# Patient Record
Sex: Male | Born: 1950 | Race: White | Hispanic: No | Marital: Married | Smoking: Former smoker
Health system: Southern US, Community
[De-identification: ages and names within clinical notes are randomized; demographics above are authoritative.]

## PROBLEM LIST (undated history)

## (undated) DIAGNOSIS — I10 Essential (primary) hypertension: Secondary | ICD-10-CM

## (undated) DIAGNOSIS — I69322 Dysarthria following cerebral infarction: Secondary | ICD-10-CM

## (undated) DIAGNOSIS — C4921 Malignant neoplasm of connective and soft tissue of right lower limb, including hip: Secondary | ICD-10-CM

## (undated) HISTORY — PX: KNEE SURGERY: SHX244

---

## 2002-06-03 ENCOUNTER — Observation Stay (HOSPITAL_COMMUNITY): Admission: EM | Admit: 2002-06-03 | Discharge: 2002-06-04 | Payer: Self-pay | Admitting: Neurology

## 2002-06-03 ENCOUNTER — Encounter: Payer: Self-pay | Admitting: Neurology

## 2002-06-23 ENCOUNTER — Encounter: Admission: RE | Admit: 2002-06-23 | Discharge: 2002-08-19 | Payer: Self-pay | Admitting: Family Medicine

## 2002-08-27 ENCOUNTER — Encounter: Admission: RE | Admit: 2002-08-27 | Discharge: 2002-11-25 | Payer: Self-pay | Admitting: Family Medicine

## 2003-12-18 ENCOUNTER — Ambulatory Visit (HOSPITAL_COMMUNITY): Admission: RE | Admit: 2003-12-18 | Discharge: 2003-12-18 | Payer: Self-pay | Admitting: Family Medicine

## 2005-01-19 ENCOUNTER — Emergency Department: Payer: Self-pay | Admitting: General Practice

## 2007-03-03 ENCOUNTER — Ambulatory Visit (HOSPITAL_COMMUNITY): Admission: RE | Admit: 2007-03-03 | Discharge: 2007-03-03 | Payer: Self-pay | Admitting: Gastroenterology

## 2007-03-03 ENCOUNTER — Encounter (INDEPENDENT_AMBULATORY_CARE_PROVIDER_SITE_OTHER): Payer: Self-pay | Admitting: Gastroenterology

## 2011-01-30 NOTE — Op Note (Signed)
NAME:  Eric Barron, Eric Barron NO.:  [id number]   MEDICAL RECORD NO.:  [id number]          PATIENT TYPE:  AMB   LOCATION:  ENDO                         FACILITY:  [facility]   PHYSICIAN:  Anselmo Rod, M.D.  DATE OF BIRTH:  [birth year]   DATE OF PROCEDURE:  03/03/2007  DATE OF DISCHARGE:                               OPERATIVE REPORT   PROCEDURE PERFORMED:  Colonoscopy with snare polypectomy x 2 and cold  biopsies x 1.   ENDOSCOPIST:  Anselmo Rod, M.D.   INSTRUMENT USED:  Pentax video colonoscope.   INDICATIONS FOR PROCEDURE:  60 year old white male with a history of  stroke in 2003 on Plavix undergoing screening colonoscopy. The patient's  Plavix had been held for a week prior to the procedure.   PREPROCEDURE PREPARATION:  Informed consent was procured from the  patient. The patient fasted for hours prior to the procedure and prepped  with Moviprep the night of and the morning of the procedure. Risks and  benefits of the procedure including a 10% miss rate of cancer and polyp  were discussed with the patient as well. The patient's Plavix was held  for 7 days prior to the procedure as mentioned above.   PREPROCEDURE PHYSICAL:  The patient had stable vital signs.  Neck  supple. Chest clear to auscultation. S1, S2 regular. Abdomen soft with  normal bowel sounds.   DESCRIPTION OF PROCEDURE:  The patient was placed in the left lateral  decubitus position and sedated with 100 mcg of fentanyl and 10 mg of  Versed given intravenously in slow incremental doses. Once the patient  was adequately sedated and maintained on low-flow oxygen and continuous  cardiac monitoring, the Pentax video colonoscope was advanced from the  rectum to the cecum.  Small internal hemorrhoids were seen on  retroflexion. A few early sigmoid diverticula were noted.  The scope was  then advanced to the transverse colon, right colon and cecum.  A small  sessile polyp was removed by hot snare from  the cecal base at lower  settings in (150/15). Another small sessile polyp was biopsied from the  cecum was well. The terminal ileum appeared healthy without lesions.  The appendiceal orifice and ileocecal valve were clearly visualized and  photographed.  A small sessile polyp was removed by hot snare from the  rectosigmoid colon as well.  The patient tolerated the procedure well  without complications.   IMPRESSION:  1. Small nonbleeding internal hemorrhoids.  2. Sigmoid diverticulosis (small diverticula present).  3. Small sessile polyp removed by hot snare from the rectosigmoid      colon in (200/20 setting).  4. Small sessile polyp removed by cold biopsy and a second larger      sessile polyp removed by hot snare from the cecum (150/50 in).  5. Normal terminal ileum.   RECOMMENDATIONS:  1. Await pathology results.  2. Avoid all nonsteroidals including aspirin for next 2 weeks.  3. Hold off on the Plavix for the next week.  4. Resume Plavix thereafter.  5. Outpatient follow-up as need arises in the future.  6.  Repeat colonoscopy will be done depending on the pathology results.      Anselmo Rod, M.D.  Electronically Signed     JNM/MEDQ  D:  03/03/2007  T:  03/04/2007  Job:  [id number]   cc:   Renaye Rakers, M.D.  Fax: [phone]

## 2011-02-02 NOTE — H&P (Signed)
NAME:  Eric Barron, Eric Barron                        ACCOUNT NO.:  [id number]   MEDICAL RECORD NO.:  [id number]                   PATIENT TYPE:  INP   LOCATION:  3020                                 FACILITY:  [facility]   PHYSICIAN:  Marlan Palau, M.D.               DATE OF BIRTH:  [birth year]   DATE OF ADMISSION:  06/03/2002  DATE OF DISCHARGE:                                HISTORY & PHYSICAL   HISTORY OF PRESENT ILLNESS:  The patient is a 60 year old left-handed white  male born 08-12-1951, with a history of hypertension that was  relatively  untreated  until an admission to [facility] in May of  2003. The patient sustained a left thalamic stroke at that time and has had  some memory deficits as a major problem since that time. This patient has  been treated with lipid lowering agents and blood pressure medications since  that time, and has returned to work  at the end of July 2003.  The patient,  however, reports problems with night sweats that have occurred off and on  over the last month, and the patient over the last  two days has felt  weird with some question of increased dragging of the  right leg and  slurred speech. For this reason,  the patient went to [facility]  yesterday. An  MRI scan of the  brain was done showing what was felt to be  an acute left pontine stroke event.   Upon  my review, this appears not to be an acute stroke. This reading is  confirmed by Dr. Paulina Fusi through radiology here at this hospital. The  old left thalamic stroke has been also seen. No new ischemic changes are  noted.  An MI angiography is not available. The patient was  sent over to  this hospital as per the family's request on heparin. The patient had been  on aspirin and Plavix prior to this admission.   PAST MEDICAL HISTORY:  Significant for:  1. 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 Evidence of pontine     ischemia that is old.  2. Hypertension.  3.  Hyperlipidemia.  4. History of night sweats x 4 weeks.  5. History of  skin cancer of some sort, status post wide resection of the     right knee.  6. 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   MEDICATIONS:  1. Plavix 25 mg q.d.  2. Lipitor 10 mg q.d.  3. Toprol XL 100 mg q.d.  4. Atacand 32/12.5 mg q.d.  5. Aspirin 81 mg q.d.   ALLERGIES:  No known drug allergies.   SOCIAL HISTORY:  He quit  smoking  two packs of cigarettes a day in May  2003. The patient does not drink alcohol.  The patient is married lives in  the  [place] area. He has one son who is alive and well, age  14.   FAMILY HISTORY:  Mother is alive and well. History of strokes are in the  mother's family. Father died at age 69 with congestive heart failure. The  patient has one  brother, one sister, both alive and well.   REVIEW OF SYSTEMS:  Notable  for chills and  sweats over the last four  weeks. They occur  at night. The patient denies headaches. Denies weight  loss. Denies shortness of breath, cough, chest pains. Denies visual field  changes,swallowing problems, dizziness, blackout episodes. He has had some  questionable increased fatigue and weakness on the right side in the last  several days. Denies  any problems with control of the  bowels or bladder.  He has had some constipation problems.   PHYSICAL EXAMINATION:  VITAL SIGNS:  Blood pressure 150/82, heart rate 64,  temperature 99.4.  GENERAL:  This patient is a fairly developed white male who is alert and  cooperative at the time of examination.  HEENT: Head is atraumatic. Eyes, pupils equally round and reactive to light.  Disks are flat bilaterally.  NECK:  Supple, no carotid bruits are noted.  RESPIRATORY:  Exam is clear.  CARDIOVASCULAR:  Examination reveals a regular rate and rhythm, no obvious  murmurs or rubs noted.  ABDOMEN:  Revealed positive bowel sounds, no organomegaly or tenderness  noted.   EXTREMITIES:  Without significant edema.  NEUROLOGIC:  Cranial nerves as above.  Facial symmetry is present. The  patient has good sensation in the face to pinprick and soft touch  bilaterally. He has good strength. The facial muscles are intact as well as  shoulder shrug bilaterally.  The visual fields are full. Extraocular  movements are full. Speech is well enunciated, not aphasic. Motor testing  shows 5/5 strength on all 4s. Good symmetric motor tone is noted throughout.  Sensory testing is intact to pinprick, soft touch and vibration sensation  throughout. The deep tendon reflexes are somewhat brisk but symmetric  throughout. Toes are downgoing bilaterally. The patient is able to perform  finger-nose-finger,  toe-to-finger bilaterally. Questionable slight ataxia  with toe-to-finger on the right as compared to the left. The patient is able  to ambulate well without assistance.  Tandem gait is unremarkable. Romberg  negative. No evidence of pronator drift is seen.   LABORATORY DATA:  Admission laboratory values are pending at this time.  Chest x-ray and EKG are pending.   IMPRESSION:  1. History of cerebrovascular disease. No evidence of an acute stroke by MRI     scan.  2. History of night sweats over the last four weeks, slight decrease in     functional level  noted with these.   This patient has an unremarkable examination at this point. Major clinical  deficit following stroke in May 2003 was that of memory problem, which  correlates well with the area of infarction involving the left thalamus of  the brain. At this point, the patient has been having  night sweats over the  last month or so. The cause of this is not  clear. Will proceed a bit  further with cerebrovascular workup and a workup for connective tissue  disease process, but it appears that the MRI scan  read at [facility]  may not be accurate. No evidence of acute pontine infarct is seen. Again this is  confirmed by review through radiology at this hospital.   PLAN:  1. An MR angiogram, intracranial and extracranial vessels.  2. Continue aspirin and Plavix.  3.     Discontinue heparin, IV fluids.  4. Admission blood work, do a connective tissue workup as well. If blood     work seems to be fairly unremarkable, consider discharge in the morning.     Will follow the patient's clinical course while in house.                                                 Marlan Palau, M.D.    CKW/MEDQ  D:  06/03/2002  T:  06/03/2002  Job:  [id number]   cc:   Myles Rosenthal, M.D.   [facility]  [address]

## 2011-02-02 NOTE — Discharge Summary (Signed)
NAME:  Eric Barron, Eric Barron                        ACCOUNT NO.:  [id number]   MEDICAL RECORD NO.:  [id number]                   PATIENT TYPE:  INP   LOCATION:  3020                                 FACILITY:  [facility]   PHYSICIAN:  Marlan Palau, M.D.               DATE OF BIRTH:  [birth year]   DATE OF ADMISSION:  06/03/2002  DATE OF DISCHARGE:  06/04/2002                                 DISCHARGE SUMMARY   ADMISSION DIAGNOSIS:  New pontine stroke event.   DISCHARGE DIAGNOSES:  1. History of cerebrovascular disease, no acute cerebrovascular infarction     identified.  2. Hypertension.  3. Hyperlipidemia.  4. Episodic night sweats, unclear etiology.   PROCEDURES:  1. MRI of the brain.  2. MR angiogram.   COMPLICATIONS:  None.   HISTORY OF PRESENT ILLNESS:  The patient is a 60 year old left-handed white  male born [birth year] with a history of hypertension, hyperlipidemia.  The  patient sustained a left thalamic stroke in May 2003, had evidence of  pontine ischemia at that time by MRI scan.  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  The patient has been feeling poorly over  the last several weeks.  He has had intermittent night sweats over the last  month.  There was some question of if he had new problems with the right  body, dragging his right leg, etc., prior to admission at the [facility] on 06/02/2002.  MRI scan at [place] was done and was apparently  read out as being an acute pontine stroke event.  The patient's family had  requested that he be transferred to [facility].  Upon review of the  MRI scan, no evidence of an acute stroke is seen.   PAST MEDICAL HISTORY:  1. 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  2. Hypertension.  3. Hyperlipidemia.  4. Recent night sweats of unclear etiology.  5. History of skin cancer of unknown  type status post resection of the right     knee.  6. Partial amputation of left toe.   ALLERGIES:  No known allergies.   HABITS:  The patient does not smoke, quit in May 2003.  Does not drink  alcohol.   MEDICATIONS ON ADMISSION:  1. Plavix 75 mg a day.  2. Lipitor 10 mg a day.  3. Toprol XL 100 mg daily.  4. Atacand 2/12.5 mg daily.  5. Aspirin 81 mg a day.   Please refer to History and Physical dictation for Social History, Family  History, Review of Systems, Physical Examination.   LABORATORY DATA:  Notable for sodium 143, potassium 3.3, chloride 110, CO2  25, glucose 118, BUN 11, creatinine 0.9, calcium 9.0, total protein 6.6,  albumin 3.4, AST 17, ALT 22, alkaline phosphatase  107, total bilirubin 0.4.  White count 9.8, hemoglobin 12.9,  hematocrit 37.7, MCV 87.1, platelets 185.   A PPD has been placed and is pending.  Sed rate 28.  ANA, rheumatoid factor,  angiotensin-converting enzyme, CEA level pending.  RPR was nonreactive.  TSH  is pending.   EKG reveals normal sinus rhythm, normal EKG.  Heart rate is 60.   HOSPITAL COURSE:  This patient was admitted to [facility].  Upon  review of the MRI scan, heparin was discontinued, and aspirin and Plavix  were reinstituted.  Potassium level was 3.3, and the patient was given some  potassium supplementation.  The patient did undergo an MRI of the brain  which again reaffirmed that no acute stroke was present in the brain.  MR  angiogram by my review appears to be relatively unremarkable.  Formal  written report is not yet available. The patient appears to have evidence of  small vessel disease by MRI scan, and treatment of this entails antiplatelet  agents.  The patient will remain on aspirin and Plavix for now.  The patient  will require further workup to evaluate the night sweats.   At this time, the patient will be discharged to home.   DISCHARGE MEDICATIONS:  1. Plavix 75 mg a day.  2. Lipitor 10 mg a day.  3.  Toprol XL 100 mg 1 a day.  4. Atacand 32/12.5 mg 1 a day.  5. Aspirin 81 mg a day.   ACTIVITY:  The patient is to have no strenuous activity.   DIET:  No added salt.   FOLLOW UP:  He will need PPD read in the next 24 hours.  Follow up with  [facility] in three weeks following discharge.                                               Marlan Palau, M.D.    CKW/MEDQ  D:  06/04/2002  T:  06/06/2002  Job:  [id number]   cc:   Alberteen Sam, M.D.  [facility]

## 2012-12-08 ENCOUNTER — Ambulatory Visit: Payer: Self-pay | Admitting: Family Medicine

## 2016-11-30 ENCOUNTER — Other Ambulatory Visit: Payer: Self-pay | Admitting: Nurse Practitioner

## 2016-11-30 DIAGNOSIS — Z8673 Personal history of transient ischemic attack (TIA), and cerebral infarction without residual deficits: Secondary | ICD-10-CM

## 2016-12-04 ENCOUNTER — Ambulatory Visit
Admission: RE | Admit: 2016-12-04 | Discharge: 2016-12-04 | Disposition: A | Discharge status: Home / Self Care | Payer: Medicare Other | Source: Ambulatory Visit | Attending: Nurse Practitioner | Admitting: Nurse Practitioner

## 2016-12-04 DIAGNOSIS — G319 Degenerative disease of nervous system, unspecified: Secondary | ICD-10-CM | POA: Insufficient documentation

## 2016-12-04 DIAGNOSIS — Z8673 Personal history of transient ischemic attack (TIA), and cerebral infarction without residual deficits: Secondary | ICD-10-CM | POA: Diagnosis present

## 2016-12-04 DIAGNOSIS — I739 Peripheral vascular disease, unspecified: Secondary | ICD-10-CM | POA: Insufficient documentation

## 2016-12-04 DIAGNOSIS — Z09 Encounter for follow-up examination after completed treatment for conditions other than malignant neoplasm: Secondary | ICD-10-CM | POA: Insufficient documentation

## 2016-12-04 LAB — POCT I-STAT CREATININE: Creatinine, Ser: 0.9 mg/dL (ref 0.61–1.24)

## 2016-12-04 MED ORDER — GADOBENATE DIMEGLUMINE 529 MG/ML IV SOLN
15.0000 mL | Freq: Once | INTRAVENOUS | Status: AC | PRN
  Administered 2016-12-04: 15 mL via INTRAVENOUS

## 2019-08-24 ENCOUNTER — Other Ambulatory Visit: Payer: Self-pay

## 2019-08-24 DIAGNOSIS — Z20822 Contact with and (suspected) exposure to covid-19: Secondary | ICD-10-CM

## 2019-08-25 LAB — NOVEL CORONAVIRUS, NAA: SARS-CoV-2, NAA: NOT DETECTED

## 2019-08-26 ENCOUNTER — Ambulatory Visit: Payer: Self-pay | Admitting: *Deleted

## 2019-08-26 NOTE — Telephone Encounter (Signed)
I had pt's family member on phone giving result and they asked to look up his result. His is negative but I advised him to quarantine as he has been exposed and just took the same phone from a positive person to speak with me while they are standing side by side without precautions.

## 2019-12-15 ENCOUNTER — Inpatient Hospital Stay (HOSPITAL_COMMUNITY)
Admission: EM | Admit: 2019-12-15 | Discharge: 2019-12-19 | DRG: 065 | Disposition: A | Discharge status: Skilled Nursing Facility | Payer: Medicare Other | Attending: Internal Medicine | Admitting: Internal Medicine

## 2019-12-15 ENCOUNTER — Emergency Department (HOSPITAL_COMMUNITY): Payer: Medicare Other

## 2019-12-15 ENCOUNTER — Other Ambulatory Visit: Payer: Self-pay

## 2019-12-15 DIAGNOSIS — Z7982 Long term (current) use of aspirin: Secondary | ICD-10-CM | POA: Diagnosis not present

## 2019-12-15 DIAGNOSIS — I6359 Cerebral infarction due to unspecified occlusion or stenosis of other cerebral artery: Secondary | ICD-10-CM | POA: Diagnosis not present

## 2019-12-15 DIAGNOSIS — Z8673 Personal history of transient ischemic attack (TIA), and cerebral infarction without residual deficits: Secondary | ICD-10-CM | POA: Diagnosis not present

## 2019-12-15 DIAGNOSIS — Z23 Encounter for immunization: Secondary | ICD-10-CM | POA: Diagnosis present

## 2019-12-15 DIAGNOSIS — Z87891 Personal history of nicotine dependence: Secondary | ICD-10-CM | POA: Diagnosis not present

## 2019-12-15 DIAGNOSIS — E785 Hyperlipidemia, unspecified: Secondary | ICD-10-CM | POA: Diagnosis present

## 2019-12-15 DIAGNOSIS — G3184 Mild cognitive impairment, so stated: Secondary | ICD-10-CM | POA: Diagnosis present

## 2019-12-15 DIAGNOSIS — R1310 Dysphagia, unspecified: Secondary | ICD-10-CM | POA: Diagnosis present

## 2019-12-15 DIAGNOSIS — R29810 Facial weakness: Secondary | ICD-10-CM | POA: Diagnosis present

## 2019-12-15 DIAGNOSIS — I739 Peripheral vascular disease, unspecified: Secondary | ICD-10-CM | POA: Diagnosis present

## 2019-12-15 DIAGNOSIS — I63512 Cerebral infarction due to unspecified occlusion or stenosis of left middle cerebral artery: Secondary | ICD-10-CM | POA: Diagnosis not present

## 2019-12-15 DIAGNOSIS — I69354 Hemiplegia and hemiparesis following cerebral infarction affecting left non-dominant side: Secondary | ICD-10-CM

## 2019-12-15 DIAGNOSIS — Z79899 Other long term (current) drug therapy: Secondary | ICD-10-CM | POA: Diagnosis not present

## 2019-12-15 DIAGNOSIS — R4781 Slurred speech: Secondary | ICD-10-CM | POA: Diagnosis present

## 2019-12-15 DIAGNOSIS — I6389 Other cerebral infarction: Secondary | ICD-10-CM | POA: Diagnosis not present

## 2019-12-15 DIAGNOSIS — I6529 Occlusion and stenosis of unspecified carotid artery: Secondary | ICD-10-CM | POA: Diagnosis present

## 2019-12-15 DIAGNOSIS — Z20822 Contact with and (suspected) exposure to covid-19: Secondary | ICD-10-CM | POA: Diagnosis present

## 2019-12-15 DIAGNOSIS — R4189 Other symptoms and signs involving cognitive functions and awareness: Secondary | ICD-10-CM | POA: Diagnosis not present

## 2019-12-15 DIAGNOSIS — Z9119 Patient's noncompliance with other medical treatment and regimen: Secondary | ICD-10-CM

## 2019-12-15 DIAGNOSIS — R7303 Prediabetes: Secondary | ICD-10-CM | POA: Diagnosis present

## 2019-12-15 DIAGNOSIS — Z7902 Long term (current) use of antithrombotics/antiplatelets: Secondary | ICD-10-CM | POA: Diagnosis not present

## 2019-12-15 DIAGNOSIS — G8194 Hemiplegia, unspecified affecting left nondominant side: Secondary | ICD-10-CM | POA: Diagnosis not present

## 2019-12-15 DIAGNOSIS — I10 Essential (primary) hypertension: Secondary | ICD-10-CM | POA: Diagnosis present

## 2019-12-15 DIAGNOSIS — I63511 Cerebral infarction due to unspecified occlusion or stenosis of right middle cerebral artery: Secondary | ICD-10-CM | POA: Diagnosis not present

## 2019-12-15 DIAGNOSIS — I639 Cerebral infarction, unspecified: Secondary | ICD-10-CM | POA: Diagnosis present

## 2019-12-15 DIAGNOSIS — R29702 NIHSS score 2: Secondary | ICD-10-CM | POA: Diagnosis present

## 2019-12-15 LAB — CBC
HCT: 47.4 % (ref 39.0–52.0)
Hemoglobin: 15.2 g/dL (ref 13.0–17.0)
MCH: 28.6 pg (ref 26.0–34.0)
MCHC: 32.1 g/dL (ref 30.0–36.0)
MCV: 89.1 fL (ref 80.0–100.0)
Platelets: 173 10*3/uL (ref 150–400)
RBC: 5.32 MIL/uL (ref 4.22–5.81)
RDW: 13.4 % (ref 11.5–15.5)
WBC: 7.3 10*3/uL (ref 4.0–10.5)
nRBC: 0 % (ref 0.0–0.2)

## 2019-12-15 LAB — DIFFERENTIAL
Abs Immature Granulocytes: 0.01 10*3/uL (ref 0.00–0.07)
Basophils Absolute: 0 10*3/uL (ref 0.0–0.1)
Basophils Relative: 1 %
Eosinophils Absolute: 0.1 10*3/uL (ref 0.0–0.5)
Eosinophils Relative: 2 %
Immature Granulocytes: 0 %
Lymphocytes Relative: 32 %
Lymphs Abs: 2.4 10*3/uL (ref 0.7–4.0)
Monocytes Absolute: 0.5 10*3/uL (ref 0.1–1.0)
Monocytes Relative: 7 %
Neutro Abs: 4.3 10*3/uL (ref 1.7–7.7)
Neutrophils Relative %: 58 %

## 2019-12-15 LAB — COMPREHENSIVE METABOLIC PANEL
ALT: 17 U/L (ref 0–44)
AST: 22 U/L (ref 15–41)
Albumin: 3.9 g/dL (ref 3.5–5.0)
Alkaline Phosphatase: 94 U/L (ref 38–126)
Anion gap: 9 (ref 5–15)
BUN: 13 mg/dL (ref 8–23)
CO2: 25 mmol/L (ref 22–32)
Calcium: 9 mg/dL (ref 8.9–10.3)
Chloride: 106 mmol/L (ref 98–111)
Creatinine, Ser: 0.86 mg/dL (ref 0.61–1.24)
GFR calc Af Amer: >60 mL/min (ref >60)
GFR calc non Af Amer: >60 mL/min (ref >60)
Glucose, Bld: 100 mg/dL — ABNORMAL HIGH (ref 70–99)
Potassium: 3.8 mmol/L (ref 3.5–5.1)
Sodium: 140 mmol/L (ref 135–145)
Total Bilirubin: 0.8 mg/dL (ref 0.3–1.2)
Total Protein: 7.1 g/dL (ref 6.5–8.1)

## 2019-12-15 LAB — PROTIME-INR
INR: 1 (ref 0.8–1.2)
Prothrombin Time: 13 seconds (ref 11.4–15.2)

## 2019-12-15 LAB — SARS CORONAVIRUS 2 (TAT 6-24 HRS): SARS Coronavirus 2: NEGATIVE

## 2019-12-15 LAB — LIPID PANEL
Cholesterol: 210 mg/dL — ABNORMAL HIGH (ref 0–200)
HDL: 58 mg/dL (ref >40)
LDL Cholesterol: 134 mg/dL — ABNORMAL HIGH (ref 0–99)
Total CHOL/HDL Ratio: 3.6 RATIO
Triglycerides: 91 mg/dL (ref <150)
VLDL: 18 mg/dL (ref 0–40)

## 2019-12-15 LAB — APTT: aPTT: 30 seconds (ref 24–36)

## 2019-12-15 LAB — I-STAT CHEM 8, ED
BUN: 14 mg/dL (ref 8–23)
Calcium, Ion: 1.19 mmol/L (ref 1.15–1.40)
Chloride: 107 mmol/L (ref 98–111)
Creatinine, Ser: 0.8 mg/dL (ref 0.61–1.24)
Glucose, Bld: 99 mg/dL (ref 70–99)
HCT: 46 % (ref 39.0–52.0)
Hemoglobin: 15.6 g/dL (ref 13.0–17.0)
Potassium: 3.7 mmol/L (ref 3.5–5.1)
Sodium: 142 mmol/L (ref 135–145)
TCO2: 30 mmol/L (ref 22–32)

## 2019-12-15 LAB — CBG MONITORING, ED: Glucose-Capillary: 88 mg/dL (ref 70–99)

## 2019-12-15 LAB — HEMOGLOBIN A1C
Hgb A1c MFr Bld: 6 % — ABNORMAL HIGH (ref 4.8–5.6)
Mean Plasma Glucose: 125.5 mg/dL

## 2019-12-15 LAB — HIV ANTIBODY (ROUTINE TESTING W REFLEX): HIV Screen 4th Generation wRfx: NONREACTIVE

## 2019-12-15 MED ORDER — ENOXAPARIN SODIUM 40 MG/0.4ML ~~LOC~~ SOLN
40.0000 mg | SUBCUTANEOUS | Status: DC
  Administered 2019-12-16 – 2019-12-18 (×3): 40 mg via SUBCUTANEOUS
  Filled 2019-12-15 (×3): qty 0.4

## 2019-12-15 MED ORDER — ASPIRIN 300 MG RE SUPP
300.0000 mg | Freq: Once | RECTAL | Status: AC
  Administered 2019-12-15: 300 mg via RECTAL
  Filled 2019-12-15: qty 1

## 2019-12-15 MED ORDER — ASPIRIN 81 MG PO CHEW
81.0000 mg | CHEWABLE_TABLET | Freq: Every day | ORAL | Status: DC
  Administered 2019-12-16 – 2019-12-19 (×4): 81 mg via ORAL
  Filled 2019-12-15 (×4): qty 1

## 2019-12-15 MED ORDER — ATORVASTATIN CALCIUM 80 MG PO TABS
80.0000 mg | ORAL_TABLET | Freq: Every day | ORAL | Status: DC
  Administered 2019-12-16 – 2019-12-18 (×3): 80 mg via ORAL
  Filled 2019-12-15 (×3): qty 1

## 2019-12-15 MED ORDER — LORAZEPAM 2 MG/ML IJ SOLN
2.0000 mg | Freq: Once | INTRAMUSCULAR | Status: AC
Start: 2019-12-15 — End: 2019-12-15
  Administered 2019-12-15: 2 mg via INTRAVENOUS
  Filled 2019-12-15: qty 1

## 2019-12-15 MED ORDER — CLEVIDIPINE BUTYRATE 0.5 MG/ML IV EMUL
0.0000 mg/h | INTRAVENOUS | Status: DC
  Administered 2019-12-15: 14:00:00 0.5 mg/h via INTRAVENOUS
  Filled 2019-12-15: qty 50

## 2019-12-15 MED ORDER — SODIUM CHLORIDE 0.9% FLUSH
3.0000 mL | Freq: Once | INTRAVENOUS | Status: AC
  Administered 2019-12-15: 18:00:00 3 mL via INTRAVENOUS

## 2019-12-15 NOTE — ED Notes (Signed)
Report attempted, 3W unable to take at this time. 

## 2019-12-15 NOTE — H&P (Addendum)
Date: 12/15/2019               Patient Name:  Eric Barron MRN: [id number]  DOB: [birth year] Age / Sex: 69 y.o., male   PCP: Patient, No Pcp Per         Medical Service: Internal Medicine Teaching Service         Attending Physician: Dr. Evette Doffing, Mallie Mussel, *    First Contact: Marianna Payment DO, Marland Kitchen Pager: [facility] [phone])  Second Contact: Eileen Stanford, MD, Obed Pager: [facility] [phone])       After Hours (After 5p/  First Contact Pager: [phone]  weekends / holidays): Second Contact Pager: [phone]   Chief Complaint: Focal neurologic deficits  History of Present Illness: Eric Barron is a 69 year old male with a pertinent past medical history of hypertension, CVA with residual left-sided weakness who presented with signs and symptoms concerning for stroke.  On evaluation the patient was unable to provide any history or events leading up to his hospitalization due to confusion and disorientation.  All history was obtained from the patient's chart.  Patient woke up this morning with left-sided facial droop, difficulty with walking and slurred speech with associated dizziness.  Unknown last known normal.  Apparently the patient normally walks 6 miles daily but was unable to do this today due to his weakness.  He did not have any chest pain, shortness of breath, fever, abdominal pain.  ED Course: Code stroke called in the ED and neurology assessed the patient was found to have an NIHSS of 2 patient received noncontrast CT he was given labetalol for significantly elevated blood pressure of 251/106.   Lab Orders     Protime-INR     APTT     CBC     Differential     Comprehensive metabolic panel     Hemoglobin A1c     Lipid panel     HIV Antibody (routine testing w rflx)     CBC     Basic metabolic panel     CBG monitoring, ED     I-stat chem 8, ED     CBG monitoring, ED   Meds:  Patient reports currently not taking medications, stopped taking about 1 year ago  Social:  Social  History   Socioeconomic History   Marital status: Married    Spouse name: Not on file   Number of children: Not on file   Years of education: Not on file   Highest education level: Not on file  Occupational History   Not on file  Tobacco Use   Smoking status: Not on file  Substance and Sexual Activity   Alcohol use: Not on file   Drug use: Not on file   Sexual activity: Not on file  Other Topics Concern   Not on file  Social History Narrative   Not on file   Social Determinants of Health   Financial Resource Strain:    Difficulty of Paying Living Expenses:   Food Insecurity:    Worried About Running Out of Food in the Last Year:    Human resources officer of Food in the Last Year:   Transportation Needs:    Film/video editor (Medical):    Lack of Transportation (Non-Medical):   Physical Activity:    Days of Exercise per Week:    Minutes of Exercise per Session:   Stress:    Feeling of Stress :   Social Connections:    Frequency of Communication with Friends  and Family:    Frequency of Social Gatherings with Friends and Family:    Attends Religious Services:    Active Member of Clubs or Organizations:    Attends Music therapist:    Marital Status:   Intimate Partner Violence:    Fear of Current or Ex-Partner:    Emotionally Abused:    Physically Abused:    Sexually Abused:     Family History: No significant family history   Allergies: Allergies as of 12/15/2019   (No Known Allergies)   Hypertension CVA Hyperlipidemia Memory deficits, likely vascular dementia Depression   Review of Systems: A complete ROS was negative except as per HPI.   Physical Exam: Blood pressure (!) 184/82, pulse (!) 56, resp. rate 20, SpO2 92 %. Physical Exam  Constitutional: No distress.  HENT:  Head: Normocephalic and atraumatic.  Eyes: Pupils are equal, round, and reactive to light. Conjunctivae and EOM are normal.  Cardiovascular: Normal rate and intact distal  pulses.  No murmur heard. Pulmonary/Chest: Effort normal. No respiratory distress.  Abdominal: Soft. He exhibits no distension.  Musculoskeletal:        General: No tenderness or edema. Normal range of motion.     Cervical back: Normal range of motion.  Neurological: He is alert. No cranial nerve deficit.  Patient was able to follow commands but had difficulty with concentration and responding verbally to questions.  Patient strength appeared to be within normal limits for his residual weakness from his previous stroke.  Skin: Skin is warm and dry. He is not diaphoretic.    Labs: CBC    Component Value Date/Time   WBC 7.3 12/15/2019 1331   RBC 5.32 12/15/2019 1331   HGB 15.6 12/15/2019 1339   HCT 46.0 12/15/2019 1339   PLT 173 12/15/2019 1331   MCV 89.1 12/15/2019 1331   MCH 28.6 12/15/2019 1331   MCHC 32.1 12/15/2019 1331   RDW 13.4 12/15/2019 1331   LYMPHSABS 2.4 12/15/2019 1331   MONOABS 0.5 12/15/2019 1331   EOSABS 0.1 12/15/2019 1331   BASOSABS 0.0 12/15/2019 1331     CMP     Component Value Date/Time   NA 142 12/15/2019 1339   K 3.7 12/15/2019 1339   CL 107 12/15/2019 1339   CO2 25 12/15/2019 1331   GLUCOSE 99 12/15/2019 1339   BUN 14 12/15/2019 1339   CREATININE 0.80 12/15/2019 1339   CALCIUM 9.0 12/15/2019 1331   PROT 7.1 12/15/2019 1331   ALBUMIN 3.9 12/15/2019 1331   AST 22 12/15/2019 1331   ALT 17 12/15/2019 1331   ALKPHOS 94 12/15/2019 1331   BILITOT 0.8 12/15/2019 1331   GFRNONAA >60 12/15/2019 1331   GFRAA >60 12/15/2019 1331    Imaging:  CT Head:  No acute intracranial hemorrhage or evidence of acute infarction. ASPECT score is 10. Moderate chronic microvascular ischemic changes. Small chronic infarcts of left thalamus and right putamen  MR Brain: Small acute infarct of the right thalamocapsular region. Chronic microvascular ischemic changes and chronic small vessel infarcts detailed above.    EKG: personally reviewed my interpretation  isSinus rhythm, Right bundle branch block  Assessment & Plan by Problem: Principal Problem:   CVA (cerebral vascular accident) [facility]) Active Problems:   Essential hypertension   Hyperlipidemia   Prediabetes   Eric Barron is a 69 y.o. with pertinent PMH of HTN and previous CVA who presented with focal neurologic deficits and admit for acute CVA on hospital day 0  #Acute CVA  Patient presented this with a history of lower extremity weakness, dysarthria, and facial asymmetry concerning for acute CVA.  MRI in the ED showed small acute infarct of the right thalamocapsular region.  Patient was not oriented and had a difficult time concentrating during exam.  Will admit patient for further evaluation and management of his stroke. -Echocardiogram pending -Hemoglobin A1c pending -Lipid panel pending -PT/OT recommendations pending -Speech evaluation for stroke swallow screen -Start high intensity statin with atorvastatin 80 mg -Permissive hypertension -Neurology consult in the ED, appreciate their assistance comanaging this patient. -We will start aspirin  #Hypertension - Labetalol as needed for blood pressure greater then [id number] systolic and A999333 diastolic   Diet: NPO VTE: Enoxaparin IVF: None,None Code: Full  Prior to Admission Living Arrangement:  Unknown Anticipated Discharge Location:  Pending PT/OT evaluation Barriers to Discharge: Further evaluation and management  Dispo: Admit patient to Inpatient with expected length of stay greater than 2 midnights.  Signed: Marianna Payment, MD 12/15/2019, 5:44 PM  Pager: [phone]

## 2019-12-15 NOTE — Code Documentation (Signed)
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 ED via [facility] on 12/15/19 with past medical hx of HTN and stroke with right sided weakness and slurred speech residuals. Code stroke was activated by EMS.   Patient from home where he was LKW at 2200 3/29 when he went to bed. Woke up this morning 0600 unable to walk. Does not use a walker at home but required one to get out of bed today. Noted elevated bp. Wife gave patient an extra bp med. Wife notified EMS when patient was still unable to ambulate without difficulty.   Stroke team at the bedside on patient arrival. Labs drawn and patient cleared for CT by EDP. Patient to CT with team. NIHSS 2, see documentation for details and code stroke times. Patient with disoriented and left facial droop on exam. The following imaging was completed:  CT. Patient is not a candidate for tPA due to out of the window. BP elevated in CT 251/106. Labatolol given. BP remained elevated in ED. Cleviprex gtt started in MRI for sbp goal <220. Ativan given for MRI per patients request. Bedside handoff with ED RN Zelphia Cairo.    Leverne Humbles Stroke Response RN

## 2019-12-15 NOTE — ED Provider Notes (Signed)
[facility] EMERG[facility] DE[facility]MENT Provider Note   CSN: [id number] Arrival date & time: 12/15/19  1325     History No chief complaint on file.   Eric Barron is a 69 y.o. male.  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  Patient was last seen normal before he went to bed last night at 2200.  Since waking up this morning he has had difficulty walking and some possible slurred speech with left-sided facial droop.  It is unclear if anybody saw him earlier this morning or why the delay in calling 911.  However it was reported that patient normally walks 6 miles a day and today was having difficulty even getting up with a walker.  He denies any chest pain, shortness of breath, fever, abdominal pain.  Patient did report feeling dizzy when he was trying to walk this morning.  He did not take any of his blood pressure Medications this morning.  The history is provided by the patient and the EMS personnel.       No past medical history on file.  There are no problems to display for this patient.        No family history on file.  Social History   Tobacco Use  . Smoking status: Not on file  Substance Use Topics  . Alcohol use: Not on file  . Drug use: Not on file    Home Medications Prior to Admission medications   Not on File    Allergies    Patient has no allergy information on record.  Review of Systems   Review of Systems  All other systems reviewed and are negative.   Physical Exam Updated Vital Signs There were no vitals taken for this visit.  Physical Exam Vitals and nursing note reviewed.  Constitutional:      General: He is not in acute distress.    Appearance: He is well-developed.  HENT:     Head: Normocephalic and atraumatic.     Mouth/Throat:     Mouth: Mucous membranes are moist.  Eyes:   Conjunctiva/sclera: Conjunctivae normal.     Pupils: Pupils are equal, round, and reactive to light.  Cardiovascular:     Rate and Rhythm: Normal rate and regular rhythm.     Pulses: Normal pulses.     Heart sounds: No murmur.  Pulmonary:     Effort: Pulmonary effort is normal. No respiratory distress.     Breath sounds: Normal breath sounds. No wheezing or rales.  Abdominal:     General: There is no distension.     Palpations: Abdomen is soft.     Tenderness: There is no abdominal tenderness. There is no guarding or rebound.  Musculoskeletal:        General: No tenderness. Normal range of motion.     Cervical back: Normal range of motion and neck supple.     Right lower leg: No edema.     Left lower leg: No edema.  Skin:    General: Skin is warm and dry.     Coloration: Skin is pale.     Findings: No erythema or rash.  Neurological:     Mental Status: He is alert and oriented to person, place, and time.     Comments: Slight left sided facial droop.  Minimal weakness noted in the LLE.  5/5 strength in  the RUE and RLE.  No notable slurred speech.  Heel to shin normal.  No nystagmus.  Psychiatric:        Behavior: Behavior normal.     ED Results / Procedures / Treatments   Labs (all labs ordered are listed, but only abnormal results are displayed) Labs Reviewed  COMPREHENSIVE METABOLIC PANEL - Abnormal; Notable for the following components:      Result Value   Glucose, Bld 100 (*)    All other components within normal limits  PROTIME-INR  APTT  CBC  DIFFERENTIAL  CBG MONITORING, ED  I-STAT CHEM 8, ED  CBG MONITORING, ED    EKG None  ED ECG REPORT   Date: 12/15/2019  Rate: 65  Rhythm: normal sinus rhythm  QRS Axis: normal  Intervals: normal  ST/T Wave abnormalities: normal  Conduction Disutrbances:right bundle branch block  Narrative Interpretation:   Old EKG Reviewed: no prior to compare  I have personally reviewed the EKG tracing and agree with the  computerized printout as noted.   Radiology MR BRAIN WO CONTRAST  Result Date: 12/15/2019 CLINICAL DATA:  Slurred speech, code stroke follow-up EXAM: MRI HEAD WITHOUT CONTRAST TECHNIQUE: Multiplanar, multiecho pulse sequences of the brain and surrounding structures were obtained without intravenous contrast. COMPARISON:  2018 FINDINGS: Brain: There is a small subcentimeter focus of reduced diffusion in the region of the posterior right internal capsule/lateral thalamus. Patchy and confluent areas of T2 hyperintensity in the supratentorial and pontine white matter are nonspecific but may reflect moderate chronic microvascular ischemic changes. There are chronic small vessel infarcts of the right putamen, left thalamus, and left subinsular white matter. No evidence of intracranial hemorrhage. There is no intracranial mass, mass effect, or edema. There is no hydrocephalus or extra-axial fluid collection. Vascular: Major vessel flow voids at the skull base are preserved. Skull and upper cervical spine: Normal marrow signal is preserved. Sinuses/Orbits: Minor mucosal thickening.  Orbits are unremarkable. Other: Sella is unremarkable.  Mastoid air cells are clear. IMPRESSION: Small acute infarct of the right thalamocapsular region. Chronic microvascular ischemic changes and chronic small vessel infarcts detailed above. Electronically Signed   By: Macy Mis M.D.   On: 12/15/2019 14:50   CT HEAD CODE STROKE WO CONTRAST  Result Date: 12/15/2019 CLINICAL DATA:  Code stroke.  Slurred speech EXAM: CT HEAD WITHOUT CONTRAST TECHNIQUE: Contiguous axial images were obtained from the base of the skull through the vertex without intravenous contrast. COMPARISON:  Correlation made with prior brain MRI from 2018 FINDINGS: Brain: There is no acute intracranial hemorrhage, mass effect, or edema. Gray-white differentiation is preserved. There are chronic small vessel infarcts of the left thalamus and right putamen.  Additional patchy and confluent areas of hypoattenuation in the supratentorial white matter are nonspecific but probably reflect moderate chronic microvascular ischemic changes. Prominence of the ventricles and sulci reflects minor generalized parenchymal volume loss. There is no extra-axial fluid collection. Vascular: No hyperdense vessel. There is atherosclerotic calcification at the skull base. Skull: Unremarkable. Sinuses/Orbits: No acute abnormality. Other: Mastoid air cells are clear. ASPECTS ([place] Stroke Program Early CT Score) - Ganglionic level infarction (caudate, lentiform nuclei, internal capsule, insula, M1-M3 cortex): 7 - Supraganglionic infarction (M4-M6 cortex): 3 Total score (0-10 with 10 being normal): 10 IMPRESSION: No acute intracranial hemorrhage or evidence of acute infarction. ASPECT score is 10. Moderate chronic microvascular ischemic changes. Small chronic infarcts of left thalamus and right putamen. These results were communicated to Dr. Cheral Marker at 1:40 pmon 3/30/2021by text page via  the [employer] system. Electronically Signed   By: Macy Mis M.D.   On: 12/15/2019 13:43    Procedures Procedures (including critical care time)  Medications Ordered in ED Medications  sodium chloride flush (NS) 0.9 % injection 3 mL (has no administration in time range)    ED Course  I have reviewed the triage vital signs and the nursing notes.  Pertinent labs & imaging results that were available during my care of the patient were reviewed by me and considered in my medical decision making (see chart for details).    MDM Rules/Calculators/A&P                      69 year old male last seen normal 22 hours ago presenting with inability to walk and complaint of dizziness.  Patient with prior history of stroke with residual deficits of mild weakness but patient is normally able to ambulate without difficulty.  Patient is hypertensive here at 250/150 concern for press versus  stroke.  He denies chest pain or shortness of breath concerning for aortic dissection.  Code stroke labs initiated in neurology was present upon patient's arrival.  No acute findings on CT the patient will need acute MRI to rule out posterior cerebellar stroke.  He denies any headache or neck pain concerning for carotid or vertebral dissection.  Patient was given labetalol for his elevated blood pressure.  We will continue to monitor and treat as needed.  1:51 PM Blood pressure remained elevated at systolics of [id number] despite labetalol and he was started on Cleviprex.  3:05 PM Patient's blood work is reassuring and MRI shows a new acute infarct.  Wife is now present and states that he has been off all blood pressure medications for the last year because he did not like the way they made him feel.  He last saw his PCP approximately 1 year ago.  However he normally is able to walk 6 miles a day and today was the first time he was unable to walk even with a walker which is very unusual per wife.  Patient's blood pressure is now 184/82.  He is not requiring Cleviprex at this time.  CRITICAL CARE Performed by: Ming Kunka Total critical care time: 30 minutes Critical care time was exclusive of separately billable procedures and treating other patients. Critical care was necessary to treat or prevent imminent or life-threatening deterioration. Critical care was time spent personally by me on the following activities: development of treatment plan with patient and/or surrogate as well as nursing, discussions with consultants, evaluation of patient's response to treatment, examination of patient, obtaining history from patient or surrogate, ordering and performing treatments and interventions, ordering and review of laboratory studies, ordering and review of radiographic studies, pulse oximetry and re-evaluation of patient's condition.    Final Clinical Impression(s) / ED Diagnoses Final diagnoses:    Cerebrovascular accident (CVA), unspecified mechanism ([facility])  Hypertension, unspecified type    Rx / DC Orders ED Discharge Orders    None       Blanchie Dessert, MD 12/15/19 1507

## 2019-12-15 NOTE — Consult Note (Signed)
Referring Physician: Dr. Maryan Rued    Chief Complaint: Acute onset of gait unsteadiness  HPI: Eric [facility]ron is an 69 y.o. male with a prior history of stroke at age of 30 with residual mild left sided weakness. He presented to day as a Code Stroke via EMS. LKN was 2200 at home. On awakening this AM at 0600, he had difficulty ambulating and a left facial droop was also noticed. At baseline, the patient walks about 6 miles every day. Today his gait dysfunction was so prominent that he had diffiiculty ambulating even with a walker that was at home (of note, he never uses a walker). He felt dizzy while walking. His BP was high at home, so his wife gave him an extra dose. He has not had any dysphasia or vision changes.   SBP was just over 200 with EMS and was as high as 250 in CT. NIHSS was 2 in CT.  LSN: 2200 on Monday tPA Given: No: Out of time window  PMHx HTN Stroke  No family history on file.  Social History:  has no history on file for tobacco, alcohol, and drug.  Allergies: Not on File  Medications:  No home medications on file. The patient states that he takes an antihypertensive medication.   ROS: No headache, fever, SOB, CP, abdominal pain or seizure-like movements. Other ROS as per HPI.   Physical Examination: BP 251/106  HEENT: /AT Lungs: Respirations unlabored Ext: No edema  Neurologic Examination: Ment: Awake, alert and oriented. Able to answer questions with fluent speech. Able to follow commands, at times with hesitancy in the context of decreased attention. No dysarthria. No hemineglect. CN: PERRL. Visual fields intact. EOMI without nystagmus. Face with mild left droop. Speech mildly hypophonic. Tongue is midline.  Motor: 5/5 RUE and RLE. 4+/5 LUE and LLE.  Sensory: Intact to FT and temp x 4 Reflexes: Unremarkable. Toes downgoing.  Cerebellar: No ataxia with FNF and heel-shin Gait: Deferred in the context of acuity of presentation.    Results for orders  placed or performed during the hospital encounter of 12/15/19 (from the past 48 hour(s))  CBG monitoring, ED     Status: None   Collection Time: 12/15/19  1:28 PM  Result Value Ref Range   Glucose-Capillary 88 70 - 99 mg/dL    Comment: Glucose reference range applies only to samples taken after fasting for at least 8 hours.  CBC     Status: None   Collection Time: 12/15/19  1:31 PM  Result Value Ref Range   WBC 7.3 4.0 - 10.5 K/uL   RBC 5.32 4.22 - 5.81 MIL/uL   Hemoglobin 15.2 13.0 - 17.0 g/dL   HCT 47.4 39.0 - 52.0 %   MCV 89.1 80.0 - 100.0 fL   MCH 28.6 26.0 - 34.0 pg   MCHC 32.1 30.0 - 36.0 g/dL   RDW 13.4 11.5 - 15.5 %   Platelets 173 150 - 400 K/uL   nRBC 0.0 0.0 - 0.2 %    Comment: Performed at [facility], [place] [address] [place],  [address]  Differential     Status: None   Collection Time: 12/15/19  1:31 PM  Result Value Ref Range   Neutrophils Relative % 58 %   Neutro Abs 4.3 1.7 - 7.7 K/uL   Lymphocytes Relative 32 %   Lymphs Abs 2.4 0.7 - 4.0 K/uL   Monocytes Relative 7 %   Monocytes Absolute 0.5 0.1 - 1.0 K/uL  Eosinophils Relative 2 %   Eosinophils Absolute 0.1 0.0 - 0.5 K/uL   Basophils Relative 1 %   Basophils Absolute 0.0 0.0 - 0.1 K/uL   Immature Granulocytes 0 %   Abs Immature Granulocytes 0.01 0.00 - 0.07 K/uL    Comment: Performed at [facility] [address] [place], [address] [address]  I-stat chem 8, ED     Status: None   Collection Time: 12/15/19  1:39 PM  Result Value Ref Range   Sodium 142 135 - 145 mmol/L   Potassium 3.7 3.5 - 5.1 mmol/L   Chloride 107 98 - 111 mmol/L   BUN 14 8 - 23 mg/dL   Creatinine, Ser 0.80 0.61 - 1.24 mg/dL   Glucose, Bld 99 70 - 99 mg/dL    Comment: Glucose reference range applies only to samples taken after fasting for at least 8 hours.   Calcium, Ion 1.19 1.15 - 1.40 mmol/L   TCO2 30 22 - 32 mmol/L   Hemoglobin 15.6 13.0 - 17.0 g/dL   HCT 46.0 39.0 - 52.0 %   CT HEAD CODE STROKE WO  CONTRAST  Result Date: 12/15/2019 CLINICAL DATA:  Code stroke.  Slurred speech EXAM: CT HEAD WITHOUT CONTRAST TECHNIQUE: Contiguous axial images were obtained from the base of the skull through the vertex without intravenous contrast. COMPARISON:  Correlation made with prior brain MRI from 2018 FINDINGS: Brain: There is no acute intracranial hemorrhage, mass effect, or edema. Gray-white differentiation is preserved. There are chronic small vessel infarcts of the left thalamus and right putamen. Additional patchy and confluent areas of hypoattenuation in the supratentorial white matter are nonspecific but probably reflect moderate chronic microvascular ischemic changes. Prominence of the ventricles and sulci reflects minor generalized parenchymal volume loss. There is no extra-axial fluid collection. Vascular: No hyperdense vessel. There is atherosclerotic calcification at the skull base. Skull: Unremarkable. Sinuses/Orbits: No acute abnormality. Other: Mastoid air cells are clear. ASPECTS ([place] Stroke Program Early CT Score) - Ganglionic level infarction (caudate, lentiform nuclei, internal capsule, insula, M1-M3 cortex): 7 - Supraganglionic infarction (M4-M6 cortex): 3 Total score (0-10 with 10 being normal): 10 IMPRESSION: No acute intracranial hemorrhage or evidence of acute infarction. ASPECT score is 10. Moderate chronic microvascular ischemic changes. Small chronic infarcts of left thalamus and right putamen. These results were communicated to Dr. Cheral Marker at 1:40 pmon 3/30/2021by text page via the [facility] messaging system. Electronically Signed   By: Macy Mis M.D.   On: 12/15/2019 13:43    Assessment: 69 y.o. male presenting with acute onset of gait unsteadiness 1. Exam reveals motor findings that are mild and most likely secondary to unmasking of a latent deficit from his old stroke in the setting of his presentation with malignant HTN.  2. Less likely would be unmasking of latent left sided  motor deficit due to an underlying toxic, metabolic or infectious etiology. A small new stroke near the territory of his old stroke is also possible, but is felt to be unlikely.  3. Stroke Risk Factors - previous stroke and HTN  Plan: 1. MRI brain 2. Management of malignant hypertension. 10 mg IV Labetalol given in CT. Clevidipine started by ED team with SBP goal of < 220 for malignant HTN.  3. Prophylactic therapy- Start ASA and a statin if MRI shows a stroke. If starting a statin, obtain baseline CK level.  4. Frequent neuro checks    Addendum: -- MRI brain reveals a small acute infarct of the right thalamocapsular  region. Also noted are chronic microvascular ischemic changes and chronic lacunar infarctions. -- Will need to be admitted for full stroke work up. Would switch BP control to modified permissive HTN protocol - treat SBP if > 180.   @Electronically  signed: Dr. Kerney Elbe 12/15/2019, 1:47 PM

## 2019-12-16 ENCOUNTER — Inpatient Hospital Stay (HOSPITAL_COMMUNITY): Payer: Medicare Other

## 2019-12-16 DIAGNOSIS — I639 Cerebral infarction, unspecified: Principal | ICD-10-CM

## 2019-12-16 DIAGNOSIS — Z87891 Personal history of nicotine dependence: Secondary | ICD-10-CM

## 2019-12-16 DIAGNOSIS — Z7982 Long term (current) use of aspirin: Secondary | ICD-10-CM

## 2019-12-16 DIAGNOSIS — E785 Hyperlipidemia, unspecified: Secondary | ICD-10-CM

## 2019-12-16 DIAGNOSIS — Z79899 Other long term (current) drug therapy: Secondary | ICD-10-CM

## 2019-12-16 DIAGNOSIS — R7303 Prediabetes: Secondary | ICD-10-CM | POA: Diagnosis present

## 2019-12-16 DIAGNOSIS — Z8673 Personal history of transient ischemic attack (TIA), and cerebral infarction without residual deficits: Secondary | ICD-10-CM

## 2019-12-16 DIAGNOSIS — I10 Essential (primary) hypertension: Secondary | ICD-10-CM | POA: Diagnosis present

## 2019-12-16 DIAGNOSIS — I6389 Other cerebral infarction: Secondary | ICD-10-CM

## 2019-12-16 DIAGNOSIS — Z7902 Long term (current) use of antithrombotics/antiplatelets: Secondary | ICD-10-CM

## 2019-12-16 LAB — CBC
HCT: 47.9 % (ref 39.0–52.0)
Hemoglobin: 15.5 g/dL (ref 13.0–17.0)
MCH: 28.7 pg (ref 26.0–34.0)
MCHC: 32.4 g/dL (ref 30.0–36.0)
MCV: 88.7 fL (ref 80.0–100.0)
Platelets: 197 K/uL (ref 150–400)
RBC: 5.4 MIL/uL (ref 4.22–5.81)
RDW: 13.4 % (ref 11.5–15.5)
WBC: 9.9 K/uL (ref 4.0–10.5)
nRBC: 0 % (ref 0.0–0.2)

## 2019-12-16 LAB — BASIC METABOLIC PANEL
Anion gap: 11 (ref 5–15)
BUN: 14 mg/dL (ref 8–23)
CO2: 27 mmol/L (ref 22–32)
Calcium: 9.7 mg/dL (ref 8.9–10.3)
Chloride: 102 mmol/L (ref 98–111)
Creatinine, Ser: 0.96 mg/dL (ref 0.61–1.24)
GFR calc Af Amer: >60 mL/min (ref >60)
GFR calc non Af Amer: >60 mL/min (ref >60)
Glucose, Bld: 112 mg/dL — ABNORMAL HIGH (ref 70–99)
Potassium: 3.9 mmol/L (ref 3.5–5.1)
Sodium: 140 mmol/L (ref 135–145)

## 2019-12-16 LAB — CK: Total CK: 430 U/L — ABNORMAL HIGH (ref 49–397)

## 2019-12-16 MED ORDER — IOHEXOL 350 MG/ML SOLN
75.0000 mL | Freq: Once | INTRAVENOUS | Status: AC | PRN
  Administered 2019-12-16: 75 mL via INTRAVENOUS

## 2019-12-16 MED ORDER — LABETALOL HCL 5 MG/ML IV SOLN: 10.0000 mg | INTRAVENOUS | Status: DC | PRN

## 2019-12-16 MED ORDER — PROMETHAZINE HCL 25 MG/ML IJ SOLN
12.5000 mg | Freq: Once | INTRAMUSCULAR | Status: AC
  Administered 2019-12-16: 12.5 mg via INTRAVENOUS
  Filled 2019-12-16: qty 1

## 2019-12-16 MED ORDER — CLOPIDOGREL BISULFATE 75 MG PO TABS
75.0000 mg | ORAL_TABLET | Freq: Every day | ORAL | Status: DC
  Administered 2019-12-16 – 2019-12-19 (×4): 75 mg via ORAL
  Filled 2019-12-16 (×4): qty 1

## 2019-12-16 NOTE — Evaluation (Signed)
Clinical/Bedside Swallow Evaluation Patient Details  Name: Eric Barron MRN: [id number] Date of Birth: Aug 18, 1951  Today's Date: 12/16/2019 Time: SLP Start Time (ACUTE ONLY): 0735 SLP Stop Time (ACUTE ONLY): 0755 SLP Time Calculation (min) (ACUTE ONLY): 20 min  Past Medical History: No past medical history on file. CVA 2003 Past Surgical History:  HPI: CVA 69 yo male adm to [facility] with confusion, disorientation.  Pt has h/o pontine CVA (left) and thalamic cva in 2003.  He is LEFT HANDED.  Pt also with h/o tobacco use.  Imaging study showed right thalamocapsular CVA.  Pt did not pass the [facility] screen and SLP was ordered.  SLP encountered pt awake and reporting hunger.   Assessment / Plan / Recommendation Clinical Impression  Patient presents with CN deficits impacting at least hypoglossal and trigeminal nerves but also concern for glossopharyngeal, vagus nerve involvement.  Pt's voice is of low amplitude = which he admits is not baseline and if vagus nerve is involved he will be at risk of silent aspiration.  Clinical indications of aspiration with thin c/b coughing post=swallow. Pt also reports baseline issues with coughing with intake - ? exacerbation or baseline swallow function.    Multiple swallows observed with larger boluses - likely due to piecemealing.  Pt clinically tolerated nectar, puree and solids.  SLP left pt with food including yogurt, graham crackers and nectar thickened juice.    Of note, pt with delayed responses to SLP questions and he appears with a flat affect = ? Personality vs cva impact.   MBS planned today at 0900 to allow instrumental evaluation/determine least restrictive diet and most appropriate care plan.  RN, OT and patient advised to plan.  SLP Visit Diagnosis: Dysphagia, unspecified (R13.10)    Aspiration Risk  Mild aspiration risk    Diet Recommendation Dysphagia 3 (Mech soft);Nectar-thick liquid   Liquid Administration via: Cup;Straw Medication  Administration: Whole meds with liquid(nectar liquids) Supervision: Patient able to self feed Compensations: Slow rate;Small sips/bites Postural Changes: Seated upright at 90 degrees;Remain upright for at least 30 minutes after po intake    Other  Recommendations Oral Care Recommendations: Oral care BID   Follow up Recommendations        Frequency and Duration   tbd         Prognosis   tbd     Swallow Study   General Date of Onset: 12/16/19 HPI: 69 yo male adm to [facility] with confusion, disorientation.  Pt has h/o pontine CVA (left) and thalamic cva in 2003.  He is LEFT HANDED.  Pt also with h/o tobacco use.  Imaging study showed right thalamocapsular CVA.  Pt did not pass the [facility] screen and SLP was ordered.  SLP encountered pt awake and reporting hunger. Diet Prior to this Study: NPO Temperature Spikes Noted: No Respiratory Status: Room air History of Recent Intubation: No Behavior/Cognition: Alert;Cooperative;Pleasant mood(delayed responses) Oral Care Completed by SLP: No Oral Cavity - Dentition: Dentures, top;Other (Comment)(few lower dentition) Vision: Functional for self-feeding Self-Feeding Abilities: Able to feed self Patient Positioning: Upright in bed Baseline Vocal Quality: Other (comment)(weak) Volitional Cough: Strong Volitional Swallow: Unable to elicit(xerostomic, will assess in flouro after oral moisture)    Oral/Motor/Sensory Function Overall Oral Motor/Sensory Function: Moderate impairment Facial ROM: Reduced right Facial Symmetry: Abnormal symmetry right;Suspected CN VII (facial) dysfunction Facial Strength: Reduced right;Suspected CN VII (facial) dysfunction Lingual ROM: Reduced right;Suspected CN XII (hypoglossal) dysfunction Lingual Symmetry: Suspected CN XII (hypoglossal) dysfunction Lingual Strength: Reduced;Suspected CN XII (hypoglossal) dysfunction  Lingual Sensation: Other (Comment)(NT) Velum: Within Functional Limits Mandible: Within Functional  Limits   Ice Chips Ice chips: Not tested   Thin Liquid Thin Liquid: Impaired Pharyngeal  Phase Impairments: Cough - Immediate;Cough - Delayed;Multiple swallows    Nectar Thick Presentation: Cup;Straw;Self Fed   Honey Thick Honey Thick Liquid: Not tested   Puree Puree: Within functional limits Presentation: Self Fed;Spoon   Solid     Solid: Within functional limits Presentation: Self Fed Other Comments: slow but effective mastication/clearance      Macario Golds 12/16/2019,8:19 AM  Kathleen Lime, MS [place] Office [phone]'

## 2019-12-16 NOTE — Progress Notes (Signed)
Patient blood pressure elevated 222/85 .Patient also complained of being sick on stomach and started vomiting clear emesis.Call placed to Dr. Gilford Rile.New order received for phenergan one time dose. MD okay with elevated blood pressure.

## 2019-12-16 NOTE — Progress Notes (Signed)
STROKE TEAM PROGRESS NOTE   INTERVAL HISTORY His granddaughter is at the bedside.  I have personally reviewed history of presenting illness with the patient in detail, electronic medical records and imaging films in PACS and answered questions.  He presented with several day history of difficulty walking with leg weakness.  He has remote history of stroke 20 years ago with some residual speech difficulties but stopped taking his aspirin and following up with his physician  Vitals:   12/16/19 0430 12/16/19 0630 12/16/19 0812 12/16/19 1133  BP: (!) 222/85 (!) 195/76 (!) 211/91 (!) 185/71  Pulse:   (!) 57 60  Resp: 18 18 20 16   Temp: 98.6 F (37 C) 98.6 F (37 C) 98.3 F (36.8 C) 98.2 F (36.8 C)  TempSrc: Oral Oral Oral Oral  SpO2: 97% 96% 97% 97%    CBC:  Recent Labs  Lab 12/15/19 1331 12/15/19 1331 12/15/19 1339 12/16/19 0337  WBC 7.3  --   --  9.9  NEUTROABS 4.3  --   --   --   HGB 15.2   < > 15.6 15.5  HCT 47.4   < > 46.0 47.9  MCV 89.1  --   --  88.7  PLT 173  --   --  197   < > = values in this interval not displayed.    Basic Metabolic Panel:  Recent Labs  Lab 12/15/19 1331 12/15/19 1331 12/15/19 1339 12/16/19 0337  NA 140   < > 142 140  K 3.8   < > 3.7 3.9  CL 106   < > 107 102  CO2 25  --   --  27  GLUCOSE 100*   < > 99 112*  BUN 13   < > 14 14  CREATININE 0.86   < > 0.80 0.96  CALCIUM 9.0  --   --  9.7   < > = values in this interval not displayed.   Lipid Panel:     Component Value Date/Time   CHOL 210 (H) 12/15/2019 1331   TRIG 91 12/15/2019 1331   HDL 58 12/15/2019 1331   CHOLHDL 3.6 12/15/2019 1331   VLDL 18 12/15/2019 1331   LDLCALC 134 (H) 12/15/2019 1331   HgbA1c:  Lab Results  Component Value Date   HGBA1C 6.0 (H) 12/15/2019   Urine Drug Screen: No results found for: LABOPIA, COCAINSCRNUR, LABBENZ, AMPHETMU, THCU, LABBARB  Alcohol Level No results found for: ETH  IMAGING past 24 hours MR BRAIN WO CONTRAST  Result Date:  12/15/2019 CLINICAL DATA:  Slurred speech, code stroke follow-up EXAM: MRI HEAD WITHOUT CONTRAST TECHNIQUE: Multiplanar, multiecho pulse sequences of the brain and surrounding structures were obtained without intravenous contrast. COMPARISON:  2018 FINDINGS: Brain: There is a small subcentimeter focus of reduced diffusion in the region of the posterior right internal capsule/lateral thalamus. Patchy and confluent areas of T2 hyperintensity in the supratentorial and pontine white matter are nonspecific but may reflect moderate chronic microvascular ischemic changes. There are chronic small vessel infarcts of the right putamen, left thalamus, and left subinsular white matter. No evidence of intracranial hemorrhage. There is no intracranial mass, mass effect, or edema. There is no hydrocephalus or extra-axial fluid collection. Vascular: Major vessel flow voids at the skull base are preserved. Skull and upper cervical spine: Normal marrow signal is preserved. Sinuses/Orbits: Minor mucosal thickening.  Orbits are unremarkable. Other: Sella is unremarkable.  Mastoid air cells are clear. IMPRESSION: Small acute infarct of the right thalamocapsular  region. Chronic microvascular ischemic changes and chronic small vessel infarcts detailed above. Electronically Signed   By: Macy Mis M.D.   On: 12/15/2019 14:50   DG Swallowing Func-Speech Pathology  Result Date: 12/16/2019 Objective Swallowing Evaluation: Type of Study: MBS-Modified Barium Swallow Study  Patient Details Name: Eric Barron MRN: [id number] Date of Birth: [birth year] Today's Date: 12/16/2019 Time: SLP Start Time (ACUTE ONLY): 0900 -SLP Stop Time (ACUTE ONLY): 0925 SLP Time Calculation (min) (ACUTE ONLY): 25 min Past Medical History: No past medical history on file. Past Surgical History: HPI: 69 yo male adm to [facility] with confusion, disorientation.  Pt has h/o pontine CVA (left) and thalamic cva in 2003.  He is LEFT HANDED.  Pt also with h/o tobacco use.   Imaging study showed right thalamocapsular CVA.  Pt did not pass the [facility] screen and SLP was ordered.  SLP encountered pt awake and reporting hunger.  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  Cervical spine appears to impinge into pharynx/cervical esophagus preventing adequate epiglottic deflection as it contacts posterior pharyngeal wall thus contributing to pharyngeal retention.  Oral deficits with impaired oral bolus manipulation and decreased propulsion with oral residuals.  Pharyngeal swallow c/b decreased tongue base retraction, impaired laryngeal elevation/closure resulting in vallecular residuals and aspiration of thin (mostly silent - delayed cough with larger amount).  Chin tuck and head turn left/right did not prevent penetration or aspiration consistently nor did it decrease retention.  Reflexive dry swallows helpful to clear pharyngeal retention *secretions retained also.  Cued effortful swallow likely improved pharyngeal clearance due to improved muscle recruitment. Barium tablet swallowed with pudding halted in esophagus *appeared near aortic arch but radiologist not present to confirm*.  Of note, pt was not sensate to halted tablet.  Liquid swallows appeared to aid transit of tablet into esophagus.  Pt was educated to all findings/recommendations live with monitor and with written precaution sign using teach back for clinical reasoning.  Pt reports his swallow ability to be at baseline - stating he has coughed with intake for as long as she can recall.  However given this new cva event with decreased mobilitization, congested cough and h/o smoking - his aspiration pna risk will be higher.  Recommend dys3/nectar liquids and allow thin water between meals after oral care. SLP Visit Diagnosis Dysphagia, oropharyngeal phase  (R13.12);Dysphagia, pharyngoesophageal phase (R13.14) Attention and concentration deficit following -- Frontal lobe and executive function deficit following -- Impact on safety and function Moderate aspiration risk   CHL IP TREATMENT RECOMMENDATION 12/16/2019 Treatment Recommendations Therapy as outlined in treatment plan below   Prognosis 12/16/2019 Prognosis for Safe Diet Advancement Fair Barriers to Reach Goals Time post onset Barriers/Prognosis Comment -- CHL IP DIET RECOMMENDATION 12/16/2019 SLP Diet Recommendations Dysphagia 3 (Mech soft) solids;Nectar thick liquid Liquid Administration via Cup;Straw Medication Administration Whole meds with puree Compensations Slow rate;Small sips/bites Postural Changes Remain semi-upright after after feeds/meals (Comment)   CHL IP OTHER RECOMMENDATIONS 12/16/2019 Recommended Consults -- Oral Care Recommendations Oral care BID Other Recommendations --   CHL IP FOLLOW UP RECOMMENDATIONS 12/16/2019 Follow up Recommendations Home health SLP   CHL IP FREQUENCY AND DURATION 12/16/2019 Speech Therapy Frequency (ACUTE ONLY) min 1 x/week Treatment Duration 1 week      CHL IP ORAL PHASE 12/16/2019 Oral Phase Impaired Oral - Pudding Teaspoon -- Oral - Pudding Cup -- Oral - Honey Teaspoon --  Oral - Honey Cup -- Oral - Nectar Teaspoon Reduced posterior propulsion;Premature spillage;Weak lingual manipulation Oral - Nectar Cup Reduced posterior propulsion;Premature spillage;Weak lingual manipulation Oral - Nectar Straw Reduced posterior propulsion;Premature spillage;Weak lingual manipulation Oral - Thin Teaspoon Reduced posterior propulsion;Premature spillage;Weak lingual manipulation Oral - Thin Cup Reduced posterior propulsion;Premature spillage;Weak lingual manipulation Oral - Thin Straw Reduced posterior propulsion;Premature spillage;Weak lingual manipulation Oral - Puree Reduced posterior propulsion;Weak lingual manipulation;Premature spillage Oral - Mech Soft Reduced posterior  propulsion;Weak lingual manipulation;Premature spillage Oral - Regular -- Oral - Multi-Consistency -- Oral - Pill Reduced posterior propulsion;Weak lingual manipulation;Premature spillage Oral Phase - Comment --  CHL IP PHARYNGEAL PHASE 12/16/2019 Pharyngeal Phase Impaired Pharyngeal- Pudding Teaspoon -- Pharyngeal -- Pharyngeal- Pudding Cup -- Pharyngeal -- Pharyngeal- Honey Teaspoon -- Pharyngeal -- Pharyngeal- Honey Cup -- Pharyngeal -- Pharyngeal- Nectar Teaspoon -- Pharyngeal -- Pharyngeal- Nectar Cup Reduced epiglottic inversion;Pharyngeal residue - valleculae;Reduced tongue base retraction Pharyngeal Material does not enter airway Pharyngeal- Nectar Straw Pharyngeal residue - valleculae;Reduced tongue base retraction;Reduced epiglottic inversion Pharyngeal Material does not enter airway Pharyngeal- Thin Teaspoon Reduced laryngeal elevation;Reduced anterior laryngeal mobility;Reduced epiglottic inversion;Reduced airway/laryngeal closure;Penetration/Aspiration during swallow;Delayed swallow initiation-pyriform sinuses;Reduced tongue base retraction Pharyngeal Material enters airway, CONTACTS cords and not ejected out Pharyngeal- Thin Cup Delayed swallow initiation-pyriform sinuses;Penetration/Aspiration during swallow;Trace aspiration;Pharyngeal residue - valleculae;Reduced epiglottic inversion;Reduced tongue base retraction Pharyngeal Material enters airway, passes BELOW cords without attempt by patient to eject out (silent aspiration) Pharyngeal- Thin Straw Delayed swallow initiation-pyriform sinuses;Penetration/Aspiration during swallow;Trace aspiration;Moderate aspiration;Pharyngeal residue - valleculae;Reduced epiglottic inversion;Reduced tongue base retraction Pharyngeal -- Pharyngeal- Puree Reduced epiglottic inversion;Pharyngeal residue - valleculae;Reduced tongue base retraction Pharyngeal Material does not enter airway Pharyngeal- Mechanical Soft Reduced epiglottic inversion;Pharyngeal residue -  valleculae;Reduced tongue base retraction Pharyngeal Material does not enter airway Pharyngeal- Regular -- Pharyngeal -- Pharyngeal- Multi-consistency -- Pharyngeal -- Pharyngeal- Pill Pharyngeal residue - valleculae;Reduced epiglottic inversion Pharyngeal Material does not enter airway Pharyngeal Comment various postures including head turn right/left with and without chin tuck were not helpful to prevent residuals not penetration/aspiration, cued cough did not clear deeper aspirates, aspiration was mild, anatomical abnormality *appeararance of cervical spine degenerative dx vs osteophytes protruding into pharynx diminished epiglottic deflection and contributed to residuals  CHL IP CERVICAL ESOPHAGEAL PHASE 12/16/2019 Cervical Esophageal Phase Impaired Pudding Teaspoon -- Pudding Cup -- Honey Teaspoon -- Honey Cup -- Nectar Teaspoon -- Nectar Cup -- Nectar Straw -- Thin Teaspoon -- Thin Cup -- Thin Straw -- Puree -- Mechanical Soft -- Regular -- Multi-consistency -- Pill -- Cervical Esophageal Comment Barium tablet swallowed with pudding halted in esophagus *appeared near aortic arch but radiologist not present to confirm*.  Of note, pt was not sensate to halted tablet.  Liquid swallows appeared to aid transit of tablet into esophagus Macario Golds 12/16/2019, 10:24 AM  Kathleen Lime, MS [facility] SLP Acute Rehab Services Office [phone]             CT HEAD CODE STROKE WO CONTRAST  Result Date: 12/15/2019 CLINICAL DATA:  Code stroke.  Slurred speech EXAM: CT HEAD WITHOUT CONTRAST TECHNIQUE: Contiguous axial images were obtained from the base of the skull through the vertex without intravenous contrast. COMPARISON:  Correlation made with prior brain MRI from 2018 FINDINGS: Brain: There is no acute intracranial hemorrhage, mass effect, or edema. Gray-white differentiation is preserved. There are chronic small vessel infarcts of the left thalamus and right putamen. Additional patchy and confluent areas of  hypoattenuation in the supratentorial white matter are nonspecific but probably reflect  moderate chronic microvascular ischemic changes. Prominence of the ventricles and sulci reflects minor generalized parenchymal volume loss. There is no extra-axial fluid collection. Vascular: No hyperdense vessel. There is atherosclerotic calcification at the skull base. Skull: Unremarkable. Sinuses/Orbits: No acute abnormality. Other: Mastoid air cells are clear. ASPECTS ([place] Stroke Program Early CT Score) - Ganglionic level infarction (caudate, lentiform nuclei, internal capsule, insula, M1-M3 cortex): 7 - Supraganglionic infarction (M4-M6 cortex): 3 Total score (0-10 with 10 being normal): 10 IMPRESSION: No acute intracranial hemorrhage or evidence of acute infarction. ASPECT score is 10. Moderate chronic microvascular ischemic changes. Small chronic infarcts of left thalamus and right putamen. These results were communicated to Dr. Cheral Marker at 1:40 pmon 3/30/2021by text page via the [facility] messaging system. Electronically Signed   By: Macy Mis M.D.   On: 12/15/2019 13:43    PHYSICAL EXAM Middle-aged Caucasian male not in distress. . Afebrile. Head is nontraumatic. Neck is supple without bruit.    Cardiac exam no murmur or gallop. Lungs are clear to auscultation. Distal pulses are well felt. Neurological Exam :  Awake alert oriented to time place and person.  Normal speech and language function.  No aphasia apraxia or dysarthria.  Extraocular movements are full range without nystagmus.  Blinks to threat bilaterally.  Mild left lower facial asymmetry.  Tongue midline.  Motor system exam no upper or lower extremity drift but mild weakness of left grip intrinsic hand muscles as well as left hip flexors and ankle dorsiflexors 4/5.  Sensation preserved bilaterally.  Deep tendon reflexes symmetric.  Plantars downgoing.  Gait not tested. ASSESSMENT/PLAN Mr. Eric Barron is a 69 y.o. male with history of stroke at  age 56 w/ resultant L sided weakness and HTN presenting with difficulty ambulating and L facie droop.   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 Moderate Small vessel disease. Old L thalamic and R putamen infarcts. ASPECTS 10.     MRI  Small R thalamic infarct. chronic small vessel disease.   CTA head & neck pending   2D Echo pending   LDL 134  HgbA1c 6.0  Lovenox 40 mg sq daily for VTE prophylaxis  No antithrombotic prior to admission (not taking meds as prescribed) now on aspirin 81 mg daily. Add plavix and continue DAPT x 3 weeks then aspirin alone. Orders adjusted  Therapy recommendations:  pending   Disposition:  pending   Hypertension  Stable . Permissive hypertension (OK if < 220/120) but gradually normalize in 5-7 days . Long-term BP goal normotensive  Hyperlipidemia  Home meds:  No statin  Now on lipitor 80  LDL 134, goal < 70  Continue statin at discharge  Prediabetes  HgbA1c 6.0  Dysphagia . Secondary to stroke . Cleared for D3 nectar thick liquids . Speech on board   Other Stroke Risk Factors  Advanced age  Hx stroke/TIA  massive stroke 20 yrs ago per family  Hospital day # 1  He presented with left sided weakness secondary to right thalamic subcortical infarct from small vessel disease.  Recommend aspirin Plavix for 3 weeks followed by aspirin alone.  Aggressive risk factor modification.  Check CT angiogram of the brain and neck as well as echocardiogram.  Patient counseled to be compliant with his medications.  Physical occupational and speech therapy consults and mobilize out of bed.  Long discussion patient and granddaughter and answered questions.  Greater than 50% time during this  35-minute visit was spent on counseling and coordination of care about his stroke and answering questions Antony Contras, MD To contact Stroke Continuity provider, please refer to [email or web address] After  hours, contact General Neurology

## 2019-12-16 NOTE — Progress Notes (Signed)
  Echocardiogram 2D Echocardiogram has been performed.  Eric Barron 12/16/2019, 10:42 AM

## 2019-12-16 NOTE — Evaluation (Addendum)
Physical Therapy Evaluation Patient Details Name: Eric Barron MRN: [id number] DOB: Nov 09, 1950 Today's Date: 12/16/2019   History of Present Illness  69 year old male with a history of CVA with residual left sided deficits, hypertension, hyperlipidemia, and signs of early vascular dementia presents to the emergency department with facial droop upon awakening and admitted with a small acute infarction of the right thalamic capsular region.    Clinical Impression  Prior to admission, pt lives with his wife, is very independent and active (walks 6 miles a day). He has mild residual left sided deficits and dysarthria from a prior stroke. On PT evaluation, pt presents with poor static/dynamic balance, decreased gait speed, decreased coordination and safety awareness. Noted left sided motor deficits with decreased balance reactions to minimal challenge. Ambulating 80 feet at a moderate assist level. Presents as a high fall risk based on deficits listed above. Will benefit from CIR to address deficits and maximize functional independence. Suspect good progress based on age, PLOF, motivation and family support.    Follow Up Recommendations CIR;Supervision/Assistance - 24 hour    Equipment Recommendations  Rolling walker with 5" wheels    Recommendations for Other Services       Precautions / Restrictions Precautions Precautions: Fall Precaution Comments: CVA with residual left sided deficits Restrictions Weight Bearing Restrictions: No      Mobility  Bed Mobility Overal bed mobility: Needs Assistance Bed Mobility: Supine to Sit;Sit to Supine     Supine to sit: Supervision Sit to supine: Supervision   General bed mobility comments: Use of bed rails. Assist for trunk control   Transfers Overall transfer level: Needs assistance Equipment used: None Transfers: Sit to/from [facility] Sit to Stand: Min guard Stand pivot transfers: Mod assist       General  transfer comment: Initial retro LOB upon standing with pt requiring assist to self-correct. Left lateral LOB when pivoting left.  Ambulation/Gait Ambulation/Gait assistance: Mod assist;Min assist Gait Distance (Feet): 80 Feet Assistive device: None;Rolling walker (2 wheeled) Gait Pattern/deviations: Step-through pattern;Decreased step length - left;Decreased dorsiflexion - left Gait velocity: decreased   General Gait Details: Pt initially ambulating in room with several gross episodes of loss of balance posteriorly and laterally towards the left. Mildly improved with use of walker in hallway, progressing to a min assist level. Cues for walker proximity.  Stairs            Wheelchair Mobility    Modified Rankin (Stroke Patients Only) Modified Rankin (Stroke Patients Only) Pre-Morbid Rankin Score: No symptoms Modified Rankin: Moderately severe disability     Balance Overall balance assessment: Needs assistance Sitting-balance support: Feet supported Sitting balance-Leahy Scale: Good    Standing balance support: No upper extremity supported;During functional activity Standing balance-Leahy Scale: Poor                               Pertinent Vitals/Pain Pain Assessment: No/denies pain    Home Living Family/patient expects to be discharged to:: Private residence Living Arrangements: Spouse/significant other(Debby) Available Help at Discharge: Family Type of Home: House Home Access: Stairs to enter Entrance Stairs-Rails: Psychiatric nurse of Steps: 5 Home Layout: One level Home Equipment: Shower seat;Cane - single point;Walker - 2 wheels;Grab bars - tub/shower      Prior Function Level of Independence: Independent         Comments: Pt independent in ADLs, IADLs, and mobility. Pt ambulates without an assistive device, up to  6 miles a day. 0 falls. Still drives.     Hand Dominance   Dominant Hand: Left    Extremity/Trunk  Assessment   Upper Extremity Assessment Upper Extremity Assessment: Defer to OT evaluation     Lower Extremity Assessment Lower Extremity Assessment: RLE deficits/detail;LLE deficits/detail RLE Deficits / Details: Strength 5/5 RLE Sensation: WNL RLE Coordination: WNL LLE Deficits / Details: Strength 4/5 LLE Sensation: WNL LLE Coordination: WNL       Communication   Communication: Other (comment)(Dysarthric)  Cognition Arousal/Alertness: Awake/alert Behavior During Therapy: WFL for tasks assessed/performed Overall Cognitive Status: History of cognitive impairments - at baseline                                 General Comments: A&O x 3. Pt pleasant and willing to participate in therapy. Pt able to follow simple one step instructions with 75% accuracy. Cues for safety.      General Comments General comments (skin integrity, edema, etc.): VSS on RA. Pt's wife and granddaughter present for session.     Exercises     Assessment/Plan    PT Assessment Patient needs continued PT services  PT Problem List Decreased strength;Decreased balance;Decreased mobility;Decreased coordination;Decreased cognition;Decreased safety awareness       PT Treatment Interventions DME instruction;Gait training;Stair training;Therapeutic activities;Functional mobility training;Therapeutic exercise;Balance training;Patient/family education    PT Goals (Current goals can be found in the Care Plan section)  Acute Rehab PT Goals Patient Stated Goal: To walk PT Goal Formulation: With patient/family Time For Goal Achievement: 12/30/19 Potential to Achieve Goals: Good    Frequency Min 4X/week   Barriers to discharge        Co-evaluation               AM-PAC PT "6 Clicks" Mobility  Outcome Measure Help needed turning from your back to your side while in a flat bed without using bedrails?: None Help needed moving from lying on your back to sitting on the side of a flat bed  without using bedrails?: None Help needed moving to and from a bed to a chair (including a wheelchair)?: A Little Help needed standing up from a chair using your arms (e.g., wheelchair or bedside chair)?: A Little Help needed to walk in hospital room?: A Lot Help needed climbing 3-5 steps with a railing? : A Lot 6 Click Score: 18    End of Session Equipment Utilized During Treatment: Gait belt Activity Tolerance: Patient tolerated treatment well Patient left: in bed;with call bell/phone within reach;with bed alarm set;with family/visitor present Nurse Communication: Mobility status PT Visit Diagnosis: Unsteadiness on feet (R26.81)    Time: 1411-1441 PT Time Calculation (min) (ACUTE ONLY): 30 min   Charges:   PT Evaluation $PT Eval Moderate Complexity: 1 Mod PT Treatments $Gait Training: 8-22 mins          Wyona Almas, PT, DPT Acute Rehabilitation Services Pager [phone] Office [phone]   Deno Etienne 12/16/2019, 3:43 PM

## 2019-12-16 NOTE — CV Procedure (Signed)
Attempted to do STAT echo but patient gioing for barium swallow test.

## 2019-12-16 NOTE — Progress Notes (Signed)
PT Cancellation Note  Patient Details Name: Eric Barron MRN: [id number] DOB: 1950-09-21   Cancelled Treatment:    Reason Eval/Treat Not Completed: Patient at procedure or test/unavailable (echo, then CT).    Wyona Almas, PT, DPT Acute Rehabilitation Services Pager [phone] Office [phone]    Deno Etienne 12/16/2019, 10:35 AM

## 2019-12-16 NOTE — Progress Notes (Addendum)
Subjective: [name] Events Overnight: Patient had an episode of clear emesis overnight.  [name] denies memory of events from yesterday.   Grand-daughter at bedside who reports that patient stopped taking his all his medications 1 year ago. She states that he is not usually confused at home until yesterday, at which time patient stated he couldn't walk and something was wrong. She denies history of memory deficits. She reports he has been talking today though.   He lives in [place] with his wife. He performs all his own ADLs. He has not seen a PCP in over a year. He walks every morning a few miles. He is a former smoker but quit 20 years ago.   Wife Eric Barron): [phone]  Objective:  Vital signs in last 24 hours: Vitals:   12/15/19 2215 12/16/19 0015 12/16/19 0215 12/16/19 0430  BP: (!) 172/86 (!) 175/97 138/89 (!) 222/85  Pulse:      Resp: 18 18 16 18   Temp: 97.8 F (36.6 C) 98 F (36.7 C) 98.6 F (37 C) 98.6 F (37 C)  TempSrc: Oral Oral Oral Oral  SpO2: 96% 96% 98% 97%   Supplemental O2: RA  Physical Exam: Physical Exam  Constitutional: He is oriented to person, place, and time and well-developed, well-nourished, and in no distress.  HENT:  Head: Normocephalic and atraumatic.  Eyes: EOM are normal.  Cardiovascular: Normal rate and intact distal pulses.  Pulmonary/Chest: Effort normal. No respiratory distress.  Abdominal: Soft. He exhibits no distension.  Musculoskeletal:        General: No edema. Normal range of motion.     Cervical back: Normal range of motion.  Neurological: He is alert and oriented to person, place, and time. He is not disoriented. He displays no weakness, no tremor, facial symmetry and normal speech. No sensory deficit.  Skin: Skin is warm.    There were no vitals filed for this visit.   Intake/Output Summary (Last 24 hours) at 12/16/2019 0553 Last data filed at 12/16/2019 0436 Gross per 24 hour  Intake 0 ml  Output 225 ml  Net  -225 ml    Risk Score: N/A   Pertinent labs/Imaging: CBC Latest Ref Rng & Units 12/16/2019 12/15/2019 12/15/2019  WBC 4.0 - 10.5 K/uL 9.9 - 7.3  Hemoglobin 13.0 - 17.0 g/dL 15.5 15.6 15.2  Hematocrit 39.0 - 52.0 % 47.9 46.0 47.4  Platelets 150 - 400 K/uL 197 - 173    CMP Latest Ref Rng & Units 12/16/2019 12/15/2019 12/15/2019  Glucose 70 - 99 mg/dL 112(H) 99 100(H)  BUN 8 - 23 mg/dL 14 14 13   Creatinine 0.61 - 1.24 mg/dL 0.96 0.80 0.86  Sodium 135 - 145 mmol/L 140 142 140  Potassium 3.5 - 5.1 mmol/L 3.9 3.7 3.8  Chloride 98 - 111 mmol/L 102 107 106  CO2 22 - 32 mmol/L 27 - 25  Calcium 8.9 - 10.3 mg/dL 9.7 - 9.0  Total Protein 6.5 - 8.1 g/dL - - 7.1  Total Bilirubin 0.3 - 1.2 mg/dL - - 0.8  Alkaline Phos 38 - 126 U/L - - 94  AST 15 - 41 U/L - - 22  ALT 0 - 44 U/L - - 17   Lipid Panel     Component Value Date/Time   CHOL 210 (H) 12/15/2019 1331   TRIG 91 12/15/2019 1331   HDL 58 12/15/2019 1331   CHOLHDL 3.6 12/15/2019 1331   VLDL 18 12/15/2019 1331   LDLCALC 134 (H) 12/15/2019 1331  Hgb A1c: 6.0  Pending Labs: CK level: pending   Imaging:  MR Brain: Small acute infarct of the right thalamocapsular region.  Chronic microvascular ischemic changes and chronic small vessel infarcts detailed above.   Assessment/Plan:  Principal Problem:   CVA (cerebral vascular accident) [facility]) Active Problems:   Essential hypertension   Hyperlipidemia   Prediabetes    Patient Summary: Eric Barron is a 69 y.o. with pertinent PMH of HTN and previous CVA  who presented with focal neurologic deficits and admit for acute CVA on hospital day 1  #Acute CVA: Patient's mental status is significantly improved overnight.  He is talking and answering questions appropriately.  He appears to have good strength and states that he feels like he is close to baseline. -CT angiogram of head and neck pending today. -Echocardiogram pending -PT/OT pending -Speech therapy  pending -Atorvastatin 80 mg -Permissive hypertension -ASA 81 mg and plavix 75 mg x 3 weeks  #Hypertension We will continue permissive hypertension and treat if SBP greater than 180 per neurology.  #Hyperlipidemia Continue atorvastatin 80 mg  #Prediabetes Has hemoglobin A1c of 6.0 consistent with prediabetes.  Patient will need further evaluation management in the outpatient setting.  Diet: NPO IVF: None,None VTE: Enoxaparin Code: Full PT/OT recs: Pending TOC recs: Pending   Dispo: Anticipated discharge likely 1-2 days.    Marianna Payment, D.O. [facility], PGY-1 Date 12/16/2019 Time 5:53 AM

## 2019-12-16 NOTE — Progress Notes (Signed)
Occupational Therapy Evaluation Patient Details Name: Eric Barron MRN: [id number] DOB: 07-Jan-1951 Today's Date: 12/16/2019    History of Present Illness 69 year old male with a history of CVA with residual left sided deficits, hypertension, hyperlipidemia, and signs of early vascular dementia presents to the emergency department with facial droop upon awakening and admitted with a small acute infarction of the right thalamic capsular region.     Clinical Impression   PTA pt lived with his wife, independent in ADLs, IADLs, and mobility. Pt was ambulating up to 6 miles/day independently without an assistive device. Pt currently requires setup to mod assist for self-care and functional transfer tasks. Pt tolerated sitting EOB 10+ min with variable supervision to min assist for balance. Noted occasional retro and right lateral lean while seated EOB with pt requiring multimodal cues and min assist to self-correct. Pt able to ambulate to/from bathroom with RW and mod assist for balance. Pt completed toileting task, able to stand ~2 min at the sink to wash hands. Noted retro and left lateral LOB during mobility tasks with pt requiring assist to self-correct. Pt required cues for safety and to slow pace. Pt demonstrates decreased strength, endurance, balance, standing tolerance, activity tolerance, and safety awareness impacting ability to complete self-care and functional transfer tasks. Recommend skilled OT services to address above deficits in order to promote function and prevent further decline. Recommend CIR for additional rehab prior to discharge home.      Follow Up Recommendations  CIR;Supervision/Assistance - 24 hour    Equipment Recommendations  None recommended by OT(TBD)    Recommendations for Other Services Rehab consult     Precautions / Restrictions Precautions Precautions: Fall;Other (comment) Precaution Comments: CVA with residual left sided deficits Restrictions Weight  Bearing Restrictions: No      Mobility Bed Mobility Overal bed mobility: Needs Assistance Bed Mobility: Supine to Sit;Sit to Supine     Supine to sit: Min assist;HOB elevated Sit to supine: Min guard   General bed mobility comments: Use of bed rails. Assist for trunk control   Transfers Overall transfer level: Needs assistance Equipment used: Rolling walker (2 wheeled) Transfers: Sit to/from [facility] Sit to Stand: Min assist Stand pivot transfers: Mod assist       General transfer comment: Initial retro LOB upon standing with pt requiring assist to self-correct. Left lateral LOB when pivoting left.    Balance Overall balance assessment: Needs assistance Sitting-balance support: Feet supported Sitting balance-Leahy Scale: Fair Sitting balance - Comments: Pt tolerated sitting EOB 10+ min with variable supervision to min assist. Occasional retro lean with pt requiring multimodal cues and assist to self-correct.    Standing balance support: Bilateral upper extremity supported Standing balance-Leahy Scale: Poor                             ADL either performed or assessed with clinical judgement   ADL Overall ADL's : Needs assistance/impaired Eating/Feeding: Set up;Bed level   Grooming: Minimal assistance;Standing Grooming Details (indicate cue type and reason): While standing at the sink. Assist for balance Upper Body Bathing: Minimal assistance;Sitting Upper Body Bathing Details (indicate cue type and reason): To maintain balance Lower Body Bathing: Minimal assistance;Moderate assistance;Sit to/from stand Lower Body Bathing Details (indicate cue type and reason): To maintain balance.  Upper Body Dressing : Minimal assistance;Sitting   Lower Body Dressing: Minimal assistance;Moderate assistance;Sitting/lateral leans;Sit to/from stand   Toilet Transfer: Minimal assistance;Ambulation;Regular Toilet;Grab bars   Toileting-  Clothing  Manipulation and Hygiene: Minimal assistance;Moderate assistance;Sit to/from stand       Functional mobility during ADLs: Moderate assistance;Rolling walker General ADL Comments: Pt able to ambulate to/from bathroom with RW and mod assist. Noted several instances of retro and left lateral LOB with pt requiring assist to self-correct.     Vision Baseline Vision/History: Wears glasses Wears Glasses: At all times Vision Assessment?: Yes Eye Alignment: Within Functional Limits Ocular Range of Motion: Within Functional Limits(Hx of CVA with residual left sided deficits) Alignment/Gaze Preference: Within Defined Limits Tracking/Visual Pursuits: Impaired - to be further tested in functional context Convergence: Impaired - to be further tested in functional context Additional Comments: Peripheral vision intact. Difficulty following testing instructions.      Perception     Praxis      Pertinent Vitals/Pain Pain Assessment: No/denies pain     Hand Dominance Left   Extremity/Trunk Assessment Upper Extremity Assessment Upper Extremity Assessment: Generalized weakness;LUE deficits/detail;RUE deficits/detail(Weak bilaterally) RUE Deficits / Details: BUE ROM WFL. Weak bilaterally. Equal strength.  RUE Sensation: WNL LUE Deficits / Details: BUE ROM WFL. Weak bilaterally. Equal strength LUE Sensation: WNL   Lower Extremity Assessment Lower Extremity Assessment: Defer to PT evaluation       Communication Communication Communication: Other (comment)(Slurred speech)   Cognition Arousal/Alertness: Awake/alert Behavior During Therapy: WFL for tasks assessed/performed Overall Cognitive Status: History of cognitive impairments - at baseline                                 General Comments: A&O x 3. Pt pleasant and willing to participate in therapy. Pt able to follow simple one step instructions with 75% accuracy. Cues for safety.   General Comments  VSS on RA. Pt's wife and  granddaughter present for session.     Exercises     Shoulder Instructions      Home Living Family/patient expects to be discharged to:: Private residence Living Arrangements: Spouse/significant other(Debby) Available Help at Discharge: Family Type of Home: House Home Access: Stairs to enter Technical brewer of Steps: 5 Entrance Stairs-Rails: Right;Left Home Layout: One level     Bathroom Shower/Tub: Teacher, early years/pre: [place]: Shower seat;Cane - single point;Walker - 2 wheels;Grab bars - tub/shower          Prior Functioning/Environment Level of Independence: Independent        Comments: Pt independent in ADLs, IADLs, and mobility. Pt ambulates without an assistive device, up to 6 miles a day. 0 falls. Still drives.        OT Problem List: Decreased strength;Decreased activity tolerance;Impaired balance (sitting and/or standing);Impaired vision/perception;Decreased cognition;Decreased safety awareness      OT Treatment/Interventions: Self-care/ADL training;Therapeutic exercise;Neuromuscular education;Energy conservation;DME and/or AE instruction;Therapeutic activities;Visual/perceptual remediation/compensation;Patient/family education;Balance training    OT Goals(Current goals can be found in the care plan section) Acute Rehab OT Goals Patient Stated Goal: To walk Time For Goal Achievement: 12/30/19 Potential to Achieve Goals: Good ADL Goals Pt Will Perform Grooming: with min guard assist;standing Pt Will Perform Lower Body Bathing: with min guard assist;sit to/from stand Pt Will Perform Lower Body Dressing: with min guard assist;sit to/from stand Pt Will Transfer to Toilet: with min guard assist;ambulating;regular height toilet Pt Will Perform Toileting - Clothing Manipulation and hygiene: with min guard assist;sit to/from stand Additional ADL Goal #1: Pt to tolerate standing up to 10 min with min guard, in preparation  for ADLs. Additional ADL Goal #2: Pt to recall and verbalize 3 fall prevention strategies with 0 verbal cues.  OT Frequency: Min 3X/week   Barriers to D/C:            Co-evaluation              AM-PAC OT "6 Clicks" Daily Activity     Outcome Measure Help from another person eating meals?: A Little Help from another person taking care of personal grooming?: A Little Help from another person toileting, which includes using toliet, bedpan, or urinal?: A Lot Help from another person bathing (including washing, rinsing, drying)?: A Lot Help from another person to put on and taking off regular upper body clothing?: A Lot Help from another person to put on and taking off regular lower body clothing?: A Lot 6 Click Score: 14   End of Session Equipment Utilized During Treatment: Gait belt;Rolling walker Nurse Communication: Mobility status  Activity Tolerance: Patient tolerated treatment well Patient left: in bed;with call bell/phone within reach;with bed alarm set;with family/visitor present  OT Visit Diagnosis: Unsteadiness on feet (R26.81);Other abnormalities of gait and mobility (R26.89);Muscle weakness (generalized) (M62.81)                Time: [id number] OT Time Calculation (min): 30 min Charges:  OT General Charges $OT Visit: 1 Visit OT Evaluation $OT Eval Moderate Complexity: 1 Mod OT Treatments $Self Care/Home Management : 8-22 mins  Mauri Brooklyn OTR/L [phone]  Mauri Brooklyn 12/16/2019, 3:09 PM

## 2019-12-16 NOTE — Progress Notes (Addendum)
  MBS completed, full report to follow.  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  Cervical spine appears to impinge into pharynx/cervical esophagus preventing adequate epiglottic deflection as it contacts posterior pharyngeal wall thus contributing to pharyngeal retention.  Oral deficits with impaired oral bolus manipulation and decreased propulsion with oral residuals.  Pharyngeal swallow c/b decreased tongue base retraction, impaired laryngeal elevation/closure resulting in vallecular residuals and aspiration of thin (mostly silent - delayed cough with larger amount).  Chin tuck and head turn left/right did not prevent penetration or aspiration consistently nor did it decrease retention.  Reflexive dry swallows helpful to clear pharyngeal retention *secretions retained also.  Cued effortful swallow likely improved pharyngeal clearance due to improved muscle recruitment.   Barium tablet swallowed with pudding halted in esophagus *appeared near aortic arch but radiologist not present to confirm*.  Of note, pt was not sensate to halted tablet.  Liquid swallows appeared to aid transit of tablet into esophagus.    Pt was educated to all findings/recommendations live with monitor and with written precaution sign using teach back for clinical reasoning.    Pt reports his swallow ability to be at baseline - stating he has coughed with intake for as long as she can recall.  However given this new cva event with decreased mobilitization, congested cough and h/o smoking - his aspiration pna risk will be higher.  Recommend dys3/nectar liquids and allow thin water between meals after oral care.    Pt would benefit from a speech and language evaluation given his dysarthria.    Kathleen Lime, MS [place] Office [phone]

## 2019-12-17 DIAGNOSIS — I6359 Cerebral infarction due to unspecified occlusion or stenosis of other cerebral artery: Secondary | ICD-10-CM

## 2019-12-17 DIAGNOSIS — R4189 Other symptoms and signs involving cognitive functions and awareness: Secondary | ICD-10-CM

## 2019-12-17 DIAGNOSIS — R1310 Dysphagia, unspecified: Secondary | ICD-10-CM

## 2019-12-17 DIAGNOSIS — G8194 Hemiplegia, unspecified affecting left nondominant side: Secondary | ICD-10-CM

## 2019-12-17 DIAGNOSIS — I10 Essential (primary) hypertension: Secondary | ICD-10-CM

## 2019-12-17 DIAGNOSIS — R7303 Prediabetes: Secondary | ICD-10-CM

## 2019-12-17 DIAGNOSIS — I63511 Cerebral infarction due to unspecified occlusion or stenosis of right middle cerebral artery: Secondary | ICD-10-CM

## 2019-12-17 NOTE — Consult Note (Signed)
Physical Medicine and Rehabilitation Consult Reason for Consult: Unsteady gait Referring Physician: Internal medicine   HPI: Eric Barron is a 69 y.o. 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  Patient currently not take any prescription medications he stopped taking them approximately 1 year ago.  Per chart review lives with spouse.  1 level home 5 steps to entry.  Independent with ADLs ambulates without assistive device and still drives.  Presented 12/15/2019 with acute onset of gait unsteadiness.  Cranial CT scan negative.  Patient did not receive TPA.  MRI showed small acute infarct right thalamocapsular region.  CT angiogram head and neck no emergent large vessel occlusion.  Echocardiogram with ejection fraction of 65% without emboli.  Admission chemistries unremarkable.  Currently maintained on aspirin and Plavix for CVA prophylaxis x3 weeks then aspirin alone.  Subcutaneous Lovenox for DVT prophylaxis.  Mechanical soft nectar thick liquid diet.  Therapy evaluations completed with recommendations of physical medicine rehab consult.  Pt's wife says he's more focused today- more "himself" LBM this AM.  Says was weak when was at home, but wife was clear, pt was walking 4-6 miles/day and was not falling- now if having LOB with PT at 100 ft.  Per wife, since pt lost 40 lbs, decided to stop ALL Meds- including HTN meds and ASA.  Review of Systems  Constitutional: Negative for chills and fever.  HENT: Negative for hearing loss.   Eyes: Negative for blurred vision and double vision.  Respiratory: Negative for cough and shortness of breath.   Cardiovascular: Negative for chest pain and palpitations.  Gastrointestinal: Positive for constipation. Negative for heartburn and nausea.  Genitourinary: Negative for flank pain and hematuria.  Musculoskeletal: Positive for  myalgias.  Skin: Negative for rash.  Neurological: Positive for weakness.  All other systems reviewed and are negative.  No past medical history on file. No family history on file. Social History:  has no history on file for tobacco, alcohol, and drug. Allergies: No Known Allergies No medications prior to admission.    Home: Home Living Family/patient expects to be discharged to:: Private residence Living Arrangements: Spouse/significant other(Debby) Available Help at Discharge: Family Type of Home: House Home Access: Stairs to enter Technical brewer of Steps: 5 Entrance Stairs-Rails: Right, Left Home Layout: One level Bathroom Shower/Tub: Chiropodist: Standard Home Equipment: [place] - single point, Environmental consultant - 2 wheels, Grab bars - tub/shower  Functional History: Prior Function Level of Independence: Independent Comments: Pt independent in ADLs, IADLs, and mobility. Pt ambulates without an assistive device, up to 6 miles a day. 0 falls. Still drives. Functional Status:  Mobility: Bed Mobility Overal bed mobility: Needs Assistance Bed Mobility: Supine to Sit, Sit to Supine Supine to sit: Supervision Sit to supine: Supervision General bed mobility comments: Use of bed rails. Assist for trunk control  Transfers Overall transfer level: Needs assistance Equipment used: None Transfers: Sit to/from Stand, Stand Pivot Transfers Sit to Stand: Min guard Stand pivot transfers: Mod assist General transfer comment: Initial retro LOB upon standing with pt requiring assist to self-correct. Left lateral LOB when pivoting left. Ambulation/Gait Ambulation/Gait assistance: Mod assist, Min assist Gait Distance (Feet): 80 Feet Assistive device: None, Rolling walker (2 wheeled) Gait Pattern/deviations: Step-through pattern, Decreased step length - left, Decreased dorsiflexion - left General Gait Details: Pt initially ambulating in room with several gross  episodes of  loss of balance posteriorly and laterally towards the left. Mildly improved with use of walker in hallway, progressing to a min assist level. Cues for walker proximity. Gait velocity: decreased    ADL: ADL Overall ADL's : Needs assistance/impaired Eating/Feeding: Set up, Bed level Grooming: Minimal assistance, Standing Grooming Details (indicate cue type and reason): While standing at the sink. Assist for balance Upper Body Bathing: Minimal assistance, Sitting Upper Body Bathing Details (indicate cue type and reason): To maintain balance Lower Body Bathing: Minimal assistance, Moderate assistance, Sit to/from stand Lower Body Bathing Details (indicate cue type and reason): To maintain balance.  Upper Body Dressing : Minimal assistance, Sitting Lower Body Dressing: Minimal assistance, Moderate assistance, Sitting/lateral leans, Sit to/from stand Toilet Transfer: Minimal assistance, Ambulation, Regular Toilet, Grab bars Toileting- Clothing Manipulation and Hygiene: Minimal assistance, Moderate assistance, Sit to/from stand Functional mobility during ADLs: Moderate assistance, Rolling walker General ADL Comments: Pt able to ambulate to/from bathroom with RW and mod assist. Noted several instances of retro and left lateral LOB with pt requiring assist to self-correct.  Cognition: Cognition Overall Cognitive Status: History of cognitive impairments - at baseline Orientation Level: Oriented to person, Oriented to place, Disoriented to time, Oriented to situation Cognition Arousal/Alertness: Awake/alert Behavior During Therapy: WFL for tasks assessed/performed Overall Cognitive Status: History of cognitive impairments - at baseline General Comments: A&O x 3. Pt pleasant and willing to participate in therapy. Pt able to follow simple one step instructions with 75% accuracy. Cues for safety.  Blood pressure (!) 175/86, pulse 60, temperature 98.5 F (36.9 C), resp. rate 17, SpO2 100  %. Physical Exam  Nursing note and vitals reviewed. Constitutional: He appears well-developed and well-nourished.  Sitting up in bedside chair, finished soft D3 thin lunch, wife at bedside, NAD; occ off topic, hard to keep on topic  HENT:  Head: Normocephalic and atraumatic.  Nose: Nose normal.  Mouth/Throat: Oropharynx is clear and moist. No oropharyngeal exudate.  R facial droop- improves with smile sensation intact on face Tongue midline  Eyes: Conjunctivae are normal. Right eye exhibits no discharge. Left eye exhibits no discharge. No scleral icterus.  EOMI B/L No nystagmus  Neck: No tracheal deviation present.  Cardiovascular:  RRR- no JVD  Respiratory: No stridor.  CTA B/L- good air movement  GI:  Soft, NT< ND, (+)BS hyperactive (just ate)  Musculoskeletal:     Cervical back: Normal range of motion and neck supple.     Comments: RUE- deltoid 4+/5, biceps 4+/5, triceps 4+/5, grip 5-/5, finger 5-/5 LUE- deltoid, biceps and triceps 4+/5, grip/finger 5-/5 RLE- HF 4/5, KE 4/5, DF/PF 5-/5 LLE- HF 4+/5, KE 4+/5, DF and PF 5-/5  Effort was touch and go  Neurological:  Patient is alert in no acute distress makes eye contact with examiner follows commands.  Oriented x3.  Pt could not stay on topic Vague, saying things occ that made no sense, but answered questions appropriately Sensation intact to light touch in all 4 extremities Finger to nose slowed B/L- a  Little worse on R  Skin: Skin is warm and dry.  Psychiatric:  Appropriate, but tangential    No results found for this or any previous visit (from the past 24 hour(s)). CT ANGIO HEAD W OR WO CONTRAST  Result Date: 12/16/2019 CLINICAL DATA:  Stroke follow-up EXAM: CT ANGIOGRAPHY HEAD AND NECK TECHNIQUE: Multidetector CT imaging of the head and neck was performed using the standard protocol during bolus administration of intravenous contrast. Multiplanar CT image reconstructions and MIPs  were obtained to evaluate the  vascular anatomy. Carotid stenosis measurements (when applicable) are obtained utilizing NASCET criteria, using the distal internal carotid diameter as the denominator. CONTRAST:  28mL OMNIPAQUE IOHEXOL 350 MG/ML SOLN COMPARISON:  None. FINDINGS: CTA NECK FINDINGS SKELETON: There is no bony spinal canal stenosis. No lytic or blastic lesion. OTHER NECK: Normal pharynx, larynx and major salivary glands. No cervical lymphadenopathy. Unremarkable thyroid gland. UPPER CHEST: No pneumothorax or pleural effusion. No nodules or masses. AORTIC ARCH: There is mild calcific atherosclerosis of the aortic arch. There is no aneurysm, dissection or hemodynamically significant stenosis of the visualized portion of the aorta. Conventional 3 vessel aortic branching pattern. The visualized proximal subclavian arteries are widely patent. RIGHT CAROTID SYSTEM: No dissection, occlusion or aneurysm. There is mixed density atherosclerosis extending into the proximal ICA, resulting in less than 50% stenosis. LEFT CAROTID SYSTEM: No dissection, occlusion or aneurysm. There is mixed density atherosclerosis extending into the proximal ICA, resulting in less than 50% stenosis. VERTEBRAL ARTERIES: Left dominant configuration. Both origins are clearly patent. There is no dissection, occlusion or flow-limiting stenosis to the skull base (V1-V3 segments). CTA HEAD FINDINGS POSTERIOR CIRCULATION: --Vertebral arteries: Normal V4 segments. --Posterior inferior cerebellar arteries (PICA): Patent origins from the vertebral arteries. --Anterior inferior cerebellar arteries (AICA): Patent origins from the basilar artery. --Basilar artery: Normal. --Superior cerebellar arteries: Normal. --Posterior cerebral arteries: Normal. Both originate from the basilar artery. Posterior communicating arteries (p-comm) are diminutive or absent. ANTERIOR CIRCULATION: --Intracranial internal carotid arteries: Normal. --Anterior cerebral arteries (ACA): Normal. Both A1  segments are present. Patent anterior communicating artery (a-comm). --Middle cerebral arteries (MCA): Normal. VENOUS SINUSES: As permitted by contrast timing, patent. ANATOMIC VARIANTS: None Review of the MIP images confirms the above findings. IMPRESSION: 1. No emergent large vessel occlusion or hemodynamically significant stenosis of the head or neck. 2. Bilateral proximal ICA atherosclerosis with less than 50% stenosis. Electronically Signed   By: Ulyses Jarred M.D.   On: 12/16/2019 19:38   CT ANGIO NECK W OR WO CONTRAST  Result Date: 12/16/2019 CLINICAL DATA:  Stroke follow-up EXAM: CT ANGIOGRAPHY HEAD AND NECK TECHNIQUE: Multidetector CT imaging of the head and neck was performed using the standard protocol during bolus administration of intravenous contrast. Multiplanar CT image reconstructions and MIPs were obtained to evaluate the vascular anatomy. Carotid stenosis measurements (when applicable) are obtained utilizing NASCET criteria, using the distal internal carotid diameter as the denominator. CONTRAST:  40mL OMNIPAQUE IOHEXOL 350 MG/ML SOLN COMPARISON:  None. FINDINGS: CTA NECK FINDINGS SKELETON: There is no bony spinal canal stenosis. No lytic or blastic lesion. OTHER NECK: Normal pharynx, larynx and major salivary glands. No cervical lymphadenopathy. Unremarkable thyroid gland. UPPER CHEST: No pneumothorax or pleural effusion. No nodules or masses. AORTIC ARCH: There is mild calcific atherosclerosis of the aortic arch. There is no aneurysm, dissection or hemodynamically significant stenosis of the visualized portion of the aorta. Conventional 3 vessel aortic branching pattern. The visualized proximal subclavian arteries are widely patent. RIGHT CAROTID SYSTEM: No dissection, occlusion or aneurysm. There is mixed density atherosclerosis extending into the proximal ICA, resulting in less than 50% stenosis. LEFT CAROTID SYSTEM: No dissection, occlusion or aneurysm. There is mixed density  atherosclerosis extending into the proximal ICA, resulting in less than 50% stenosis. VERTEBRAL ARTERIES: Left dominant configuration. Both origins are clearly patent. There is no dissection, occlusion or flow-limiting stenosis to the skull base (V1-V3 segments). CTA HEAD FINDINGS POSTERIOR CIRCULATION: --Vertebral arteries: Normal V4 segments. --Posterior inferior cerebellar arteries (PICA): Patent  origins from the vertebral arteries. --Anterior inferior cerebellar arteries (AICA): Patent origins from the basilar artery. --Basilar artery: Normal. --Superior cerebellar arteries: Normal. --Posterior cerebral arteries: Normal. Both originate from the basilar artery. Posterior communicating arteries (p-comm) are diminutive or absent. ANTERIOR CIRCULATION: --Intracranial internal carotid arteries: Normal. --Anterior cerebral arteries (ACA): Normal. Both A1 segments are present. Patent anterior communicating artery (a-comm). --Middle cerebral arteries (MCA): Normal. VENOUS SINUSES: As permitted by contrast timing, patent. ANATOMIC VARIANTS: None Review of the MIP images confirms the above findings. IMPRESSION: 1. No emergent large vessel occlusion or hemodynamically significant stenosis of the head or neck. 2. Bilateral proximal ICA atherosclerosis with less than 50% stenosis. Electronically Signed   By: Ulyses Jarred M.D.   On: 12/16/2019 19:38   MR BRAIN WO CONTRAST  Result Date: 12/15/2019 CLINICAL DATA:  Slurred speech, code stroke follow-up EXAM: MRI HEAD WITHOUT CONTRAST TECHNIQUE: Multiplanar, multiecho pulse sequences of the brain and surrounding structures were obtained without intravenous contrast. COMPARISON:  2018 FINDINGS: Brain: There is a small subcentimeter focus of reduced diffusion in the region of the posterior right internal capsule/lateral thalamus. Patchy and confluent areas of T2 hyperintensity in the supratentorial and pontine white matter are nonspecific but may reflect moderate chronic  microvascular ischemic changes. There are chronic small vessel infarcts of the right putamen, left thalamus, and left subinsular white matter. No evidence of intracranial hemorrhage. There is no intracranial mass, mass effect, or edema. There is no hydrocephalus or extra-axial fluid collection. Vascular: Major vessel flow voids at the skull base are preserved. Skull and upper cervical spine: Normal marrow signal is preserved. Sinuses/Orbits: Minor mucosal thickening.  Orbits are unremarkable. Other: Sella is unremarkable.  Mastoid air cells are clear. IMPRESSION: Small acute infarct of the right thalamocapsular region. Chronic microvascular ischemic changes and chronic small vessel infarcts detailed above. Electronically Signed   By: Macy Mis M.D.   On: 12/15/2019 14:50   DG Swallowing Func-Speech Pathology  Result Date: 12/16/2019 Objective Swallowing Evaluation: Type of Study: MBS-Modified Barium Swallow Study  Patient Details Name: Eric Barron MRN: [id number] Date of Birth: [birth year] Today's Date: 12/16/2019 Time: SLP Start Time (ACUTE ONLY): 0900 -SLP Stop Time (ACUTE ONLY): 0925 SLP Time Calculation (min) (ACUTE ONLY): 25 min Past Medical History: No past medical history on file. Past Surgical History: HPI: 69 yo male adm to [facility] with confusion, disorientation.  Pt has h/o pontine CVA (left) and thalamic cva in 2003.  He is LEFT HANDED.  Pt also with h/o tobacco use.  Imaging study showed right thalamocapsular CVA.  Pt did not pass the [facility] screen and SLP was ordered.  SLP encountered pt awake and reporting hunger.  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  Cervical spine appears to impinge into pharynx/cervical esophagus preventing adequate epiglottic deflection as it contacts posterior pharyngeal wall thus contributing to  pharyngeal retention.  Oral deficits with impaired oral bolus manipulation and decreased propulsion with oral residuals.  Pharyngeal swallow c/b decreased tongue base retraction, impaired laryngeal elevation/closure resulting in vallecular residuals and aspiration of thin (mostly silent - delayed cough with larger amount).  Chin tuck and head turn left/right did not prevent penetration or aspiration consistently nor did it decrease retention.  Reflexive dry swallows helpful to clear pharyngeal retention *secretions retained also.  Cued effortful swallow likely improved pharyngeal clearance due to improved muscle recruitment. Barium tablet swallowed with pudding  halted in esophagus *appeared near aortic arch but radiologist not present to confirm*.  Of note, pt was not sensate to halted tablet.  Liquid swallows appeared to aid transit of tablet into esophagus.  Pt was educated to all findings/recommendations live with monitor and with written precaution sign using teach back for clinical reasoning.  Pt reports his swallow ability to be at baseline - stating he has coughed with intake for as long as she can recall.  However given this new cva event with decreased mobilitization, congested cough and h/o smoking - his aspiration pna risk will be higher.  Recommend dys3/nectar liquids and allow thin water between meals after oral care. SLP Visit Diagnosis Dysphagia, oropharyngeal phase (R13.12);Dysphagia, pharyngoesophageal phase (R13.14) Attention and concentration deficit following -- Frontal lobe and executive function deficit following -- Impact on safety and function Moderate aspiration risk   CHL IP TREATMENT RECOMMENDATION 12/16/2019 Treatment Recommendations Therapy as outlined in treatment plan below   Prognosis 12/16/2019 Prognosis for Safe Diet Advancement Fair Barriers to Reach Goals Time post onset Barriers/Prognosis Comment -- CHL IP DIET RECOMMENDATION 12/16/2019 SLP Diet Recommendations Dysphagia 3 (Mech  soft) solids;Nectar thick liquid Liquid Administration via Cup;Straw Medication Administration Whole meds with puree Compensations Slow rate;Small sips/bites Postural Changes Remain semi-upright after after feeds/meals (Comment)   CHL IP OTHER RECOMMENDATIONS 12/16/2019 Recommended Consults -- Oral Care Recommendations Oral care BID Other Recommendations --   CHL IP FOLLOW UP RECOMMENDATIONS 12/16/2019 Follow up Recommendations Home health SLP   CHL IP FREQUENCY AND DURATION 12/16/2019 Speech Therapy Frequency (ACUTE ONLY) min 1 x/week Treatment Duration 1 week      CHL IP ORAL PHASE 12/16/2019 Oral Phase Impaired Oral - Pudding Teaspoon -- Oral - Pudding Cup -- Oral - Honey Teaspoon -- Oral - Honey Cup -- Oral - Nectar Teaspoon Reduced posterior propulsion;Premature spillage;Weak lingual manipulation Oral - Nectar Cup Reduced posterior propulsion;Premature spillage;Weak lingual manipulation Oral - Nectar Straw Reduced posterior propulsion;Premature spillage;Weak lingual manipulation Oral - Thin Teaspoon Reduced posterior propulsion;Premature spillage;Weak lingual manipulation Oral - Thin Cup Reduced posterior propulsion;Premature spillage;Weak lingual manipulation Oral - Thin Straw Reduced posterior propulsion;Premature spillage;Weak lingual manipulation Oral - Puree Reduced posterior propulsion;Weak lingual manipulation;Premature spillage Oral - Mech Soft Reduced posterior propulsion;Weak lingual manipulation;Premature spillage Oral - Regular -- Oral - Multi-Consistency -- Oral - Pill Reduced posterior propulsion;Weak lingual manipulation;Premature spillage Oral Phase - Comment --  CHL IP PHARYNGEAL PHASE 12/16/2019 Pharyngeal Phase Impaired Pharyngeal- Pudding Teaspoon -- Pharyngeal -- Pharyngeal- Pudding Cup -- Pharyngeal -- Pharyngeal- Honey Teaspoon -- Pharyngeal -- Pharyngeal- Honey Cup -- Pharyngeal -- Pharyngeal- Nectar Teaspoon -- Pharyngeal -- Pharyngeal- Nectar Cup Reduced epiglottic inversion;Pharyngeal  residue - valleculae;Reduced tongue base retraction Pharyngeal Material does not enter airway Pharyngeal- Nectar Straw Pharyngeal residue - valleculae;Reduced tongue base retraction;Reduced epiglottic inversion Pharyngeal Material does not enter airway Pharyngeal- Thin Teaspoon Reduced laryngeal elevation;Reduced anterior laryngeal mobility;Reduced epiglottic inversion;Reduced airway/laryngeal closure;Penetration/Aspiration during swallow;Delayed swallow initiation-pyriform sinuses;Reduced tongue base retraction Pharyngeal Material enters airway, CONTACTS cords and not ejected out Pharyngeal- Thin Cup Delayed swallow initiation-pyriform sinuses;Penetration/Aspiration during swallow;Trace aspiration;Pharyngeal residue - valleculae;Reduced epiglottic inversion;Reduced tongue base retraction Pharyngeal Material enters airway, passes BELOW cords without attempt by patient to eject out (silent aspiration) Pharyngeal- Thin Straw Delayed swallow initiation-pyriform sinuses;Penetration/Aspiration during swallow;Trace aspiration;Moderate aspiration;Pharyngeal residue - valleculae;Reduced epiglottic inversion;Reduced tongue base retraction Pharyngeal -- Pharyngeal- Puree Reduced epiglottic inversion;Pharyngeal residue - valleculae;Reduced tongue base retraction Pharyngeal Material does not enter airway Pharyngeal- Mechanical Soft Reduced epiglottic inversion;Pharyngeal residue - valleculae;Reduced tongue base retraction Pharyngeal  Material does not enter airway Pharyngeal- Regular -- Pharyngeal -- Pharyngeal- Multi-consistency -- Pharyngeal -- Pharyngeal- Pill Pharyngeal residue - valleculae;Reduced epiglottic inversion Pharyngeal Material does not enter airway Pharyngeal Comment various postures including head turn right/left with and without chin tuck were not helpful to prevent residuals not penetration/aspiration, cued cough did not clear deeper aspirates, aspiration was mild, anatomical abnormality *appeararance of  cervical spine degenerative dx vs osteophytes protruding into pharynx diminished epiglottic deflection and contributed to residuals  CHL IP CERVICAL ESOPHAGEAL PHASE 12/16/2019 Cervical Esophageal Phase Impaired Pudding Teaspoon -- Pudding Cup -- Honey Teaspoon -- Honey Cup -- Nectar Teaspoon -- Nectar Cup -- Nectar Straw -- Thin Teaspoon -- Thin Cup -- Thin Straw -- Puree -- Mechanical Soft -- Regular -- Multi-consistency -- Pill -- Cervical Esophageal Comment Barium tablet swallowed with pudding halted in esophagus *appeared near aortic arch but radiologist not present to confirm*.  Of note, pt was not sensate to halted tablet.  Liquid swallows appeared to aid transit of tablet into esophagus Macario Golds 12/16/2019, 10:24 AM  Kathleen Lime, MS [facility] SLP Acute Rehab Services Office [phone]             ECHOCARDIOGRAM COMPLETE BUBBLE STUDY  Result Date: 12/16/2019    ECHOCARDIOGRAM REPORT   Patient Name:   Eric Barron Date of Exam: 12/16/2019 Medical Rec #:  [id number]        Height: Accession #:    [id number]       Weight: Date of Birth:  [birth year]       BSA: Patient Age:    45 years         BP:           211/91 mmHg Patient Gender: M                HR:           57 bpm. Exam Location:  Inpatient Procedure: 2D Echo, Cardiac Doppler and Color Doppler Indications:    CVA  History:        Patient has no prior history of Echocardiogram examinations.                 Stroke; Risk Factors:Hypertension.  Sonographer:    Dustin Flock Referring Phys: [place]  1. Left ventricular ejection fraction, by estimation, is 60 to 65%. The left ventricle has normal function. The left ventricle has no regional wall motion abnormalities. There is moderate concentric left ventricular hypertrophy. Left ventricular diastolic parameters are indeterminate.  2. Right ventricular systolic function is normal. The right ventricular size is normal.  3. The mitral valve is normal in structure. Trivial  mitral valve regurgitation. No evidence of mitral stenosis.  4. The aortic valve is grossly normal. Aortic valve regurgitation is not visualized. No aortic stenosis is present.  5. The inferior vena cava is normal in size with <50% respiratory variability, suggesting right atrial pressure of 8 mmHg.  6. Agitated saline contrast bubble study was negative, with no evidence of any interatrial shunt. Comparison(s): No prior Echocardiogram. Conclusion(s)/Recommendation(s): No intracardiac source of embolism detected on this transthoracic study. A transesophageal echocardiogram is recommended to exclude cardiac source of embolism if clinically indicated. FINDINGS  Left Ventricle: Left ventricular ejection fraction, by estimation, is 60 to 65%. The left ventricle has normal function. The left ventricle has no regional wall motion abnormalities. The left ventricular internal cavity size was normal in size. There is  moderate concentric left ventricular hypertrophy. Left ventricular  diastolic parameters are indeterminate. Right Ventricle: The right ventricular size is normal. No increase in right ventricular wall thickness. Right ventricular systolic function is normal. Left Atrium: Left atrial size was normal in size. Right Atrium: Right atrial size was normal in size. Pericardium: There is no evidence of pericardial effusion. Mitral Valve: The mitral valve is normal in structure. Trivial mitral valve regurgitation. No evidence of mitral valve stenosis. Tricuspid Valve: The tricuspid valve is normal in structure. Tricuspid valve regurgitation is trivial. No evidence of tricuspid stenosis. Aortic Valve: The aortic valve is grossly normal. Aortic valve regurgitation is not visualized. No aortic stenosis is present. Pulmonic Valve: The pulmonic valve was not well visualized. Pulmonic valve regurgitation is not visualized. No evidence of pulmonic stenosis. Aorta: The aortic root, ascending aorta and aortic arch are all  structurally normal, with no evidence of dilitation or obstruction. Pulmonary Artery: The pulmonary artery is not well seen. Venous: The inferior vena cava is normal in size with less than 50% respiratory variability, suggesting right atrial pressure of 8 mmHg. IAS/Shunts: No atrial level shunt detected by color flow Doppler. Agitated saline contrast was given intravenously to evaluate for intracardiac shunting. Agitated saline contrast bubble study was negative, with no evidence of any interatrial shunt.  LEFT VENTRICLE PLAX 2D LVIDd:         4.50 cm  Diastology LVIDs:         2.90 cm  LV e' lateral:   8.38 cm/s LV PW:         1.50 cm  LV E/e' lateral: 6.9 LV IVS:        1.50 cm  LV e' medial:    5.11 cm/s LVOT diam:     2.00 cm  LV E/e' medial:  11.4 LV SV:         68 LVOT Area:     3.14 cm  RIGHT VENTRICLE RV Basal diam:  2.90 cm RV S prime:     14.30 cm/s TAPSE (M-mode): 2.4 cm LEFT ATRIUM LA diam:        3.50 cm LA Vol (A2C):   47.4 ml LA Vol (A4C):   38.5 ml LA Biplane Vol: 46.8 ml  AORTIC VALVE LVOT Vmax:   105.00 cm/s LVOT Vmean:  66.500 cm/s LVOT VTI:    0.217 m  AORTA Ao Root diam: 2.90 cm MITRAL VALVE MV Area (PHT): 1.98 cm    SHUNTS MV Decel Time: 384 msec    Systemic VTI:  0.22 m MV E velocity: 58.20 cm/s  Systemic Diam: 2.00 cm MV A velocity: 88.30 cm/s MV E/A ratio:  0.66 Buford Dresser MD Electronically signed by Buford Dresser MD Signature Date/Time: 12/16/2019/4:24:11 PM    Final    CT HEAD CODE STROKE WO CONTRAST  Result Date: 12/15/2019 CLINICAL DATA:  Code stroke.  Slurred speech EXAM: CT HEAD WITHOUT CONTRAST TECHNIQUE: Contiguous axial images were obtained from the base of the skull through the vertex without intravenous contrast. COMPARISON:  Correlation made with prior brain MRI from 2018 FINDINGS: Brain: There is no acute intracranial hemorrhage, mass effect, or edema. Gray-white differentiation is preserved. There are chronic small vessel infarcts of the left thalamus  and right putamen. Additional patchy and confluent areas of hypoattenuation in the supratentorial white matter are nonspecific but probably reflect moderate chronic microvascular ischemic changes. Prominence of the ventricles and sulci reflects minor generalized parenchymal volume loss. There is no extra-axial fluid collection. Vascular: No hyperdense vessel. There is atherosclerotic calcification at the skull  base. Skull: Unremarkable. Sinuses/Orbits: No acute abnormality. Other: Mastoid air cells are clear. ASPECTS (McClain Stroke Program Early CT Score) - Ganglionic level infarction (caudate, lentiform nuclei, internal capsule, insula, M1-M3 cortex): 7 - Supraganglionic infarction (M4-M6 cortex): 3 Total score (0-10 with 10 being normal): 10 IMPRESSION: No acute intracranial hemorrhage or evidence of acute infarction. ASPECT score is 10. Moderate chronic microvascular ischemic changes. Small chronic infarcts of left thalamus and right putamen. These results were communicated to Dr. Cheral Marker at 1:40 pmon 3/30/2021by text page via the [facility] messaging system. Electronically Signed   By: Macy Mis M.D.   On: 12/15/2019 13:43     Assessment/Plan: Diagnosis: R thalamic infarct 1. Does the need for close, 24 hr/day medical supervision in concert with the patient's rehab needs make it unreasonable for this patient to be served in a less intensive setting? Yes 2. Co-Morbidities requiring supervision/potential complications: HTN- noncompliant, previous CVA 20 yrs ago; prediabetes A1c 6.0; dysphagia; loss of balance 3. Due to bowel management, safety, skin/wound care, disease management, medication administration and patient education, does the patient require 24 hr/day rehab nursing? Yes 4. Does the patient require coordinated care of a physician, rehab nurse, therapy disciplines of PT, OT and SLP to address physical and functional deficits in the context of the above medical diagnosis(es)? Yes Addressing  deficits in the following areas: balance, endurance, locomotion, strength, transferring, bathing, dressing, feeding, grooming, toileting, cognition and swallowing 5. Can the patient actively participate in an intensive therapy program of at least 3 hrs of therapy per day at least 5 days per week? Yes 6. The potential for patient to make measurable gains while on inpatient rehab is good 7. Anticipated functional outcomes upon discharge from inpatient rehab are modified independent  with PT, modified independent with OT, modified independent with SLP. 8. Estimated rehab length of stay to reach the above functional goals is: 5-7 days 9. Anticipated discharge destination: Home 10. Overall Rehab/Functional Prognosis: excellent  RECOMMENDATIONS: This patient's condition is appropriate for continued rehabilitative care in the following setting: CIR and HH Therapy Patient has agreed to participate in recommended program. Potentially Note that insurance prior authorization may be required for reimbursement for recommended care.  Comment:  1. Pt wants to go home, however think AT THIS MOMENT, could benefit from CIR- however if progresses greatly in next 24-48 hours, might get too good for CIR.  2. Educated pt it's necessary to continue meds for life time- or increases risk of another CVA.  3. Will see pt is doing in next 24-48 hrs- minimum cannot admit to CIR until tomorrow currently.  4. Thank you for this consult.   Lavon Paganini Angiulli, PA-C 12/17/2019

## 2019-12-17 NOTE — Progress Notes (Signed)
STROKE TEAM PROGRESS NOTE   INTERVAL HISTORY His wife is at the bedside.  His speech is a lot improved today.  Though he still has some hypophonia.  Vital signs stable.  Echocardiogram is normal. Vitals:   12/17/19 0327 12/17/19 0831 12/17/19 0836 12/17/19 1316  BP: (!) 175/86 (!) 201/99 (!) 193/95 (!) 179/97  Pulse: 60 70 65 83  Resp: 17 15  18   Temp: 98.5 F (36.9 C) 97.8 F (36.6 C)  98.6 F (37 C)  TempSrc:  Axillary  Oral  SpO2: 100% 97%  97%    CBC:  Recent Labs  Lab 12/15/19 1331 12/15/19 1331 12/15/19 1339 12/16/19 0337  WBC 7.3  --   --  9.9  NEUTROABS 4.3  --   --   --   HGB 15.2   < > 15.6 15.5  HCT 47.4   < > 46.0 47.9  MCV 89.1  --   --  88.7  PLT 173  --   --  197   < > = values in this interval not displayed.    Basic Metabolic Panel:  Recent Labs  Lab 12/15/19 1331 12/15/19 1331 12/15/19 1339 12/16/19 0337  NA 140   < > 142 140  K 3.8   < > 3.7 3.9  CL 106   < > 107 102  CO2 25  --   --  27  GLUCOSE 100*   < > 99 112*  BUN 13   < > 14 14  CREATININE 0.86   < > 0.80 0.96  CALCIUM 9.0  --   --  9.7   < > = values in this interval not displayed.   Lipid Panel:     Component Value Date/Time   CHOL 210 (H) 12/15/2019 1331   TRIG 91 12/15/2019 1331   HDL 58 12/15/2019 1331   CHOLHDL 3.6 12/15/2019 1331   VLDL 18 12/15/2019 1331   LDLCALC 134 (H) 12/15/2019 1331   HgbA1c:  Lab Results  Component Value Date   HGBA1C 6.0 (H) 12/15/2019   Urine Drug Screen: No results found for: LABOPIA, COCAINSCRNUR, LABBENZ, AMPHETMU, THCU, LABBARB  Alcohol Level No results found for: ETH  IMAGING past 24 hours CT ANGIO HEAD W OR WO CONTRAST  Result Date: 12/16/2019 CLINICAL DATA:  Stroke follow-up EXAM: CT ANGIOGRAPHY HEAD AND NECK TECHNIQUE: Multidetector CT imaging of the head and neck was performed using the standard protocol during bolus administration of intravenous contrast. Multiplanar CT image reconstructions and MIPs were obtained to evaluate  the vascular anatomy. Carotid stenosis measurements (when applicable) are obtained utilizing NASCET criteria, using the distal internal carotid diameter as the denominator. CONTRAST:  21mL OMNIPAQUE IOHEXOL 350 MG/ML SOLN COMPARISON:  None. FINDINGS: CTA NECK FINDINGS SKELETON: There is no bony spinal canal stenosis. No lytic or blastic lesion. OTHER NECK: Normal pharynx, larynx and major salivary glands. No cervical lymphadenopathy. Unremarkable thyroid gland. UPPER CHEST: No pneumothorax or pleural effusion. No nodules or masses. AORTIC ARCH: There is mild calcific atherosclerosis of the aortic arch. There is no aneurysm, dissection or hemodynamically significant stenosis of the visualized portion of the aorta. Conventional 3 vessel aortic branching pattern. The visualized proximal subclavian arteries are widely patent. RIGHT CAROTID SYSTEM: No dissection, occlusion or aneurysm. There is mixed density atherosclerosis extending into the proximal ICA, resulting in less than 50% stenosis. LEFT CAROTID SYSTEM: No dissection, occlusion or aneurysm. There is mixed density atherosclerosis extending into the proximal ICA, resulting in less than 50%  stenosis. VERTEBRAL ARTERIES: Left dominant configuration. Both origins are clearly patent. There is no dissection, occlusion or flow-limiting stenosis to the skull base (V1-V3 segments). CTA HEAD FINDINGS POSTERIOR CIRCULATION: --Vertebral arteries: Normal V4 segments. --Posterior inferior cerebellar arteries (PICA): Patent origins from the vertebral arteries. --Anterior inferior cerebellar arteries (AICA): Patent origins from the basilar artery. --Basilar artery: Normal. --Superior cerebellar arteries: Normal. --Posterior cerebral arteries: Normal. Both originate from the basilar artery. Posterior communicating arteries (p-comm) are diminutive or absent. ANTERIOR CIRCULATION: --Intracranial internal carotid arteries: Normal. --Anterior cerebral arteries (ACA): Normal. Both A1  segments are present. Patent anterior communicating artery (a-comm). --Middle cerebral arteries (MCA): Normal. VENOUS SINUSES: As permitted by contrast timing, patent. ANATOMIC VARIANTS: None Review of the MIP images confirms the above findings. IMPRESSION: 1. No emergent large vessel occlusion or hemodynamically significant stenosis of the head or neck. 2. Bilateral proximal ICA atherosclerosis with less than 50% stenosis. Electronically Signed   By: Ulyses Jarred M.D.   On: 12/16/2019 19:38   CT ANGIO NECK W OR WO CONTRAST  Result Date: 12/16/2019 CLINICAL DATA:  Stroke follow-up EXAM: CT ANGIOGRAPHY HEAD AND NECK TECHNIQUE: Multidetector CT imaging of the head and neck was performed using the standard protocol during bolus administration of intravenous contrast. Multiplanar CT image reconstructions and MIPs were obtained to evaluate the vascular anatomy. Carotid stenosis measurements (when applicable) are obtained utilizing NASCET criteria, using the distal internal carotid diameter as the denominator. CONTRAST:  67mL OMNIPAQUE IOHEXOL 350 MG/ML SOLN COMPARISON:  None. FINDINGS: CTA NECK FINDINGS SKELETON: There is no bony spinal canal stenosis. No lytic or blastic lesion. OTHER NECK: Normal pharynx, larynx and major salivary glands. No cervical lymphadenopathy. Unremarkable thyroid gland. UPPER CHEST: No pneumothorax or pleural effusion. No nodules or masses. AORTIC ARCH: There is mild calcific atherosclerosis of the aortic arch. There is no aneurysm, dissection or hemodynamically significant stenosis of the visualized portion of the aorta. Conventional 3 vessel aortic branching pattern. The visualized proximal subclavian arteries are widely patent. RIGHT CAROTID SYSTEM: No dissection, occlusion or aneurysm. There is mixed density atherosclerosis extending into the proximal ICA, resulting in less than 50% stenosis. LEFT CAROTID SYSTEM: No dissection, occlusion or aneurysm. There is mixed density  atherosclerosis extending into the proximal ICA, resulting in less than 50% stenosis. VERTEBRAL ARTERIES: Left dominant configuration. Both origins are clearly patent. There is no dissection, occlusion or flow-limiting stenosis to the skull base (V1-V3 segments). CTA HEAD FINDINGS POSTERIOR CIRCULATION: --Vertebral arteries: Normal V4 segments. --Posterior inferior cerebellar arteries (PICA): Patent origins from the vertebral arteries. --Anterior inferior cerebellar arteries (AICA): Patent origins from the basilar artery. --Basilar artery: Normal. --Superior cerebellar arteries: Normal. --Posterior cerebral arteries: Normal. Both originate from the basilar artery. Posterior communicating arteries (p-comm) are diminutive or absent. ANTERIOR CIRCULATION: --Intracranial internal carotid arteries: Normal. --Anterior cerebral arteries (ACA): Normal. Both A1 segments are present. Patent anterior communicating artery (a-comm). --Middle cerebral arteries (MCA): Normal. VENOUS SINUSES: As permitted by contrast timing, patent. ANATOMIC VARIANTS: None Review of the MIP images confirms the above findings. IMPRESSION: 1. No emergent large vessel occlusion or hemodynamically significant stenosis of the head or neck. 2. Bilateral proximal ICA atherosclerosis with less than 50% stenosis. Electronically Signed   By: Ulyses Jarred M.D.   On: 12/16/2019 19:38    PHYSICAL EXAM Middle-aged Caucasian male not in distress. . Afebrile. Head is nontraumatic. Neck is supple without bruit.    Cardiac exam no murmur or gallop. Lungs are clear to auscultation. Distal pulses are well felt. Neurological  Exam :  Awake alert oriented to time place and person.  Normal speech and language function.  No aphasia apraxia or dysarthria.  Extraocular movements are full range without nystagmus.  Blinks to threat bilaterally.  Mild left lower facial asymmetry.  Tongue midline.  Motor system exam no upper or lower extremity drift but mild weakness of  left grip intrinsic hand muscles as well as left hip flexors and ankle dorsiflexors 4/5.  Sensation preserved bilaterally.  Deep tendon reflexes symmetric.  Plantars downgoing.  Gait not tested. ASSESSMENT/PLAN Eric Barron is a 69 y.o. male with history of stroke at age 30 w/ resultant L sided weakness and HTN presenting with difficulty ambulating and L facie droop.   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 Moderate Small vessel disease. Old L thalamic and R putamen infarcts. ASPECTS 10.     MRI  Small R thalamic infarct. chronic small vessel disease.   CTA head & neck no large vessel stenosis or occlusion.  Bilateral proximal ICA less than 50% stenosis   2D Echo ejection fraction 60 to 65%.  No clot  LDL 134  HgbA1c 6.0  Lovenox 40 mg sq daily for VTE prophylaxis  No antithrombotic prior to admission (not taking meds as prescribed) now on aspirin 81 mg daily. Add plavix and continue DAPT x 3 weeks then aspirin alone. Orders adjusted  Therapy recommendations:  pending   Disposition:  pending   Hypertension  Stable . Permissive hypertension (OK if < 220/120) but gradually normalize in 5-7 days . Long-term BP goal normotensive  Hyperlipidemia  Home meds:  No statin  Now on lipitor 80  LDL 134, goal < 70  Continue statin at discharge  Prediabetes  HgbA1c 6.0  Dysphagia . Secondary to stroke . Cleared for D3 nectar thick liquids . Speech on board   Other Stroke Risk Factors  Advanced age  Hx stroke/TIA  massive stroke 20 yrs ago per family  Hospital day # 2  He presented with left sided weakness secondary to right thalamic subcortical infarct from small vessel disease.  Recommend aspirin Plavix for 3 weeks followed by aspirin alone.  Aggressive risk factor modification.  Patient counseled to be compliant with his medications.  Continue ongoing therapies.  Stroke team will sign off.   Follow-up as an outpatient stroke clinic in 6 weeks.  Discussed with patient and wife and answered questions.  Long discussion patient and granddaughter and answered questions.  Greater than 50% time during this 25-minute visit was spent on counseling and coordination of care about his stroke and answering questions Eric Contras, MD To contact Stroke Continuity provider, please refer to [email or web address] After hours, contact General Neurology

## 2019-12-17 NOTE — Discharge Summary (Addendum)
Name: Eric Barron MRN: [id number] DOB: Apr 13, 1951 69 y.o. PCP: Patient, No Pcp Per  Date of Admission: 12/15/2019  1:26 PM Date of Discharge: 12/19/19 Attending Physician: Aldine Contes, MD Discharge Diagnosis: 1. Acute R Thalamic CVA 2. HTN 3. Prediabetes  Discharge Medications: Allergies as of 12/19/2019   No Known Allergies     Medication List    TAKE these medications   amLODipine 10 MG tablet Commonly known as: NORVASC Take 1 tablet (10 mg total) by mouth daily.   aspirin 81 MG chewable tablet Chew 1 tablet (81 mg total) by mouth daily.   atorvastatin 80 MG tablet Commonly known as: LIPITOR Take 1 tablet (80 mg total) by mouth daily at 6 PM.   clopidogrel 75 MG tablet Commonly known as: PLAVIX Take 1 tablet (75 mg total) by mouth daily.      Disposition and follow-up: Mr.Eric Barron was discharged from [facility] in stable condition.  At the hospital follow up visit please address:  1. Acute R Thalamic CVA: Patient had stopped taking all of his medications for a year prior to the CVA. Small acute right thalamic infarct was seen on MRI brain on 3/30.  Deficits mainly include balance, mild left lower extremity weakness, mild cognitive impairment characterized by inattention and forgetfulness. CT angio of the head and neck demonstrated bilateral ICA atherosclerosis with less than 50% stenosis.   -Aspirin 81 mg daily plus Plavix 75 mg daily for 3 weeks followed by aspirin alone (stop date 4/21) -F/u in neurology stroke clinic in 6 weeks  2.HTN: The patient stated that he has not taken any of his antihypertensives in over a year because they made him feel poorly. Patient was permissively hypertensive during the first 48 hours post stroke.  He was started on amlodipine. -Amlodipine 10 mg daily -Titrate BP meds to normotensive within 5 to 7 days post stroke.   3. Prediabetes: A1c 6.0 -f/u with PCP  3.  Labs / imaging needed at time of  follow-up: none  4.  Pending labs/ test needing follow-up: none  Follow-up Appointments:  Ambulatory referral provided to neurology.  Plan to follow-up in 6 weeks.  Hospital Course by problem list: 1. Acute R Thalamic CVA 2. HTN 3. Prediabetes  In summary, [name] is a 69 year old gentleman with a history of HTN, HLD previous CVA who presented with facial droop upon awakening and was found to have a small acute infarction of the right thalamic capsular region.  CT of the head was unremarkable initially, but follow-up MRI discover the lesion. CT angio of the head and neck were performed and did not show significant stenosis. The patient's facial droop resolved but has difficulties with balance, and mild left lower extremity weakness as well as some mild cognitive impairment. The patient was evaluated by neurology and was started on aspirin 81 mg and Plavix 75 mg daily for 3 weeks followed by aspirin alone (stop date for plavix 4/21).  The patient was permissively hypertensive and started on amlodipine 5 mg daily.  The patient was discharged to CIR. Neurology wants the patient to follow-up in the stroke clinic in 6 weeks.  Discharge Vitals:   BP (!) 199/88 (BP Location: Right Arm)   Pulse 70   Temp 98.8 F (37.1 C) (Oral)   Resp 16   SpO2 95%   Pertinent Labs, Studies, and Procedures:  CT Head: IMPRESSION: No acute intracranial hemorrhage or evidence of acute infarction. ASPECT score is 10.  Moderate chronic microvascular ischemic changes. Small chronic infarcts of left thalamus and right putamen.  CT Angio Head & Neck: IMPRESSION: 1. No emergent large vessel occlusion or hemodynamically significant stenosis of the head or neck. 2. Bilateral proximal ICA atherosclerosis with less than 50% stenosis.  MRI Brain: IMPRESSION: 1. Small acute infarct of the right thalamocapsular region. 2. Chronic microvascular ischemic changes and chronic small vessel infarcts detailed  above.  Discharge Instructions:  Signed: Earlene Plater, MD Internal Medicine, PGY1 Pager: [phone]  12/19/2019,8:13 AM

## 2019-12-17 NOTE — Progress Notes (Signed)
   Subjective:   Pt was seen at the bedside today. Eric Barron's wife is at bedside and assisted with history. Eric Barron notes feeling "pretty good" this morning. He has had prior stroke with which he had right sided deficits and short term memory loss. Current stroke resulted in left sided weakness and balance issues. Discussed need for inpatient rehab. Pt and wife is in agreement.  Objective: CBC Latest Ref Rng & Units 12/16/2019 12/15/2019 12/15/2019  WBC 4.0 - 10.5 K/uL 9.9 - 7.3  Hemoglobin 13.0 - 17.0 g/dL 15.5 15.6 15.2  Hematocrit 39.0 - 52.0 % 47.9 46.0 47.4  Platelets 150 - 400 K/uL 197 - 173   BMP Latest Ref Rng & Units 12/16/2019 12/15/2019 12/15/2019  Glucose 70 - 99 mg/dL 112(H) 99 100(H)  BUN 8 - 23 mg/dL 14 14 13   Creatinine 0.61 - 1.24 mg/dL 0.96 0.80 0.86  Sodium 135 - 145 mmol/L 140 142 140  Potassium 3.5 - 5.1 mmol/L 3.9 3.7 3.8  Chloride 98 - 111 mmol/L 102 107 106  CO2 22 - 32 mmol/L 27 - 25  Calcium 8.9 - 10.3 mg/dL 9.7 - 9.0   Vital signs in last 24 hours: Vitals:   12/16/19 1609 12/16/19 1920 12/16/19 2312 12/17/19 0327  BP: (!) 204/89 (!) 184/85 (!) 155/78 (!) 175/86  Pulse: 64 70 (!) 56 60  Resp: 20 16 16 17   Temp: 98.7 F (37.1 C) 98 F (36.7 C) 99 F (37.2 C) 98.5 F (36.9 C)  TempSrc: Oral Oral Oral   SpO2: 97% 96% 95% 100%    Physical Exam General: Comfortably sitting up in the bedside chair HEENT: NCAT, EOMI CV: Normal S1-S2, no murmurs rubs or gallops appreciated.  No peripheral edema PULM: Clear to auscultation bilaterally, no crackles or wheezes appreciated ABD: Soft and nontender in all quadrants.  Bowel sounds present. NEURO: Alert and oriented.  Answers questions appropriately.  Cranial nerves intact.  5/5 strength in all extremities.  Sensation grossly intact in all extremities.  Assessment/Plan:  Principal Problem:   CVA (cerebral vascular accident) [facility]) Active Problems:   Essential hypertension   Hyperlipidemia   Prediabetes  Eric Barron is a 69 y.o. with pertinent PMH of HTN and previous CVA who presented with focal neurologic deficits and admit for acute R thalamic infarct.   #Acute R Thalamic Infarct: #HLD: Eric Barron's deficits are currently with balance, left-sided weakness, and mild cognitive impairment.  CT angiogram of the head and neck was performed yesterday and showed bilateral proximal ICA atherosclerosis with less than 50% stenosis.  Echocardiogram unremarkable.  -Neurology following, appreciate reccomendations -Atorvastatin 80 mg daily -ASA 81 mg and plavix 75 mg x 3 weeks followed by ASA alone (stop date April 21) -PT/OT reccommending CIR, consult ordered  #HTN: Does not take any medications at home. Eric Barron is approaching 48 hours post stroke -Permissive HTN. Start low dose antihypertensives tomorrow  -IV labetalol 10 mg as needed for SBP > 220 or DBP >110  #Prediabetes: A1c of 6.0  -carb modified diet -f/u with PCP about controlling blood sugars  #FEN/GI -Diet: Dysphagia 3 -Fluids: None  #DVT prophylaxis -Lovenox 40 mg subq injections daily  #CODE STATUS: FULL  #Dispo: Prior to Admission Living Arrangement: Home Anticipated Discharge Location: CIR vs HH Barriers to Discharge: CIR currently evaluating for appropriateness of placement.  Eric Barron is medically stable.  Earlene Plater, MD Internal Medicine, PGY1 Pager: [phone]  12/17/2019,11:57 AM

## 2019-12-17 NOTE — Progress Notes (Signed)
Physical Therapy Treatment Patient Details Name: Eric Barron MRN: [id number] DOB: 1950-12-17 Today's Date: 12/17/2019    History of Present Illness 69 year old male with a history of CVA with residual left sided deficits, hypertension, hyperlipidemia, and signs of early vascular dementia presents to the emergency department with facial droop upon awakening and admitted with a small acute infarction of the right thalamic capsular region.      PT Comments    Pt making steady progress towards his physical therapy goals. Session focused on gait training, therapeutic exercises for strengthening and static balance. Pt requiring moderate assist for ambulation initially, progressing to a minimal assist level although utilizing with a very cautious, slow, step to gait pattern. Demonstrates excellent activity tolerance throughout and remains very motivated. Continues with left sided motor deficits, poor static/dynamic balance, decreased coordination and gait abnormalities. Remains excellent candidate for CIR to address deficits and maximize functional independence.     Follow Up Recommendations  CIR;Supervision/Assistance - 24 hour     Equipment Recommendations  Rolling walker with 5" wheels    Recommendations for Other Services       Precautions / Restrictions Precautions Precautions: Fall Restrictions Weight Bearing Restrictions: No    Mobility  Bed Mobility Overal bed mobility: Modified Independent                Transfers Overall transfer level: Needs assistance Equipment used: None Transfers: Sit to/from Stand Sit to Stand: Min guard         General transfer comment: Close min guard to initially steady upon rise from bed and toilet  Ambulation/Gait Ambulation/Gait assistance: Min assist;Mod assist Gait Distance (Feet): 100 Feet Assistive device: None Gait Pattern/deviations: Step-to pattern;Step-through pattern;Decreased step length - left;Decreased stance time  - left;Decreased stride length;Decreased dorsiflexion - left;Decreased dorsiflexion - right;Shuffle     General Gait Details: Pt with initially 2-3 episodes of gross loss of balance laterally, requiring modA to correct. Utilizing step to pattern and progressing to step through pattern with minA in hallway. Pt demonstrates decreased bilateral heel strike, decreased LLE stance time/weightbearing, shortened L step length, and decreased reciprocal arm swing. Cues provided for heel strike at initial contact, putting R arm by side (tends to be in high guard position).   Stairs             Wheelchair Mobility    Modified Rankin (Stroke Patients Only) Modified Rankin (Stroke Patients Only) Pre-Morbid Rankin Score: No symptoms Modified Rankin: Moderately severe disability     Balance Overall balance assessment: Needs assistance Sitting-balance support: Feet supported Sitting balance-Leahy Scale: Good     Standing balance support: No upper extremity supported;During functional activity Standing balance-Leahy Scale: Fair       Tandem Stance - Right Leg: 0 Tandem Stance - Left Leg: 10 Rhomberg - Eyes Opened: 10                  Cognition Arousal/Alertness: Awake/alert Behavior During Therapy: WFL for tasks assessed/performed Overall Cognitive Status: History of cognitive impairments - at baseline                                 General Comments: Has difficulty following multi step commands, needs visual demonstration at times and repetition      Exercises General Exercises - Lower Extremity Heel Raises: Both;10 reps;Standing Other Exercises Other Exercises: Step ups with both right and left LE leading x 10 each Other Exercises: Lateral  step ups x 10 Other Exercises: Standing: hamstring curls x 10 (with light fingertip support on sink)    General Comments        Pertinent Vitals/Pain Pain Assessment: No/denies pain    Home Living                       Prior Function            PT Goals (current goals can now be found in the care plan section) Acute Rehab PT Goals Patient Stated Goal: To walk Potential to Achieve Goals: Good Progress towards PT goals: Progressing toward goals    Frequency    Min 4X/week      PT Plan Current plan remains appropriate    Co-evaluation              AM-PAC PT "6 Clicks" Mobility   Outcome Measure  Help needed turning from your back to your side while in a flat bed without using bedrails?: None Help needed moving from lying on your back to sitting on the side of a flat bed without using bedrails?: None Help needed moving to and from a bed to a chair (including a wheelchair)?: A Little Help needed standing up from a chair using your arms (e.g., wheelchair or bedside chair)?: A Little Help needed to walk in hospital room?: A Lot Help needed climbing 3-5 steps with a railing? : A Little 6 Click Score: 19    End of Session Equipment Utilized During Treatment: Gait belt Activity Tolerance: Patient tolerated treatment well Patient left: in chair;with call bell/phone within reach;with chair alarm set Nurse Communication: Mobility status PT Visit Diagnosis: Unsteadiness on feet (R26.81)     Time: [id number] PT Time Calculation (min) (ACUTE ONLY): 33 min  Charges:  $Gait Training: 8-22 mins $Therapeutic Exercise: 8-22 mins                       Wyona Almas, PT, DPT Acute Rehabilitation Services Pager [phone] Office [phone]    Deno Etienne 12/17/2019, 10:22 AM

## 2019-12-18 DIAGNOSIS — I63512 Cerebral infarction due to unspecified occlusion or stenosis of left middle cerebral artery: Secondary | ICD-10-CM

## 2019-12-18 LAB — BASIC METABOLIC PANEL
Anion gap: 9 (ref 5–15)
BUN: 27 mg/dL — ABNORMAL HIGH (ref 8–23)
CO2: 26 mmol/L (ref 22–32)
Calcium: 9.1 mg/dL (ref 8.9–10.3)
Chloride: 107 mmol/L (ref 98–111)
Creatinine, Ser: 1.05 mg/dL (ref 0.61–1.24)
GFR calc Af Amer: >60 mL/min (ref >60)
GFR calc non Af Amer: >60 mL/min (ref >60)
Glucose, Bld: 101 mg/dL — ABNORMAL HIGH (ref 70–99)
Potassium: 3.5 mmol/L (ref 3.5–5.1)
Sodium: 142 mmol/L (ref 135–145)

## 2019-12-18 MED ORDER — AMLODIPINE BESYLATE 5 MG PO TABS
5.0000 mg | ORAL_TABLET | Freq: Every day | ORAL | Status: DC
  Administered 2019-12-18: 5 mg via ORAL
  Filled 2019-12-18: qty 1

## 2019-12-18 NOTE — PMR Pre-admission (Signed)
PMR Admission Coordinator Pre-Admission Assessment  Patient: Eric Barron is an 70 y.o., male MRN: [id number] DOB: 17-Dec-1950 Height:   Weight:                Insurance Information HMO:     PPO:      PCP:      IPA:      80/20:      OTHER:  PRIMARY: Medicare A and B      Policy#: [id number]      Subscriber: pt CM Name:       Phone#:      Fax#:  Pre-Cert#: verified eligibility online      Employer:  Benefits:  Phone #:      Name:  Eff. Date: A 06/17/04, B 03/17/16     Deduct: $1484      Out of Pocket Max: n/a      Life Max: n/a CIR: 100%      SNF: 20 full days Outpatient: 80%     Co-Pay: 20% Home Health: 100%      Co-Pay:  DME: 80%     Co-Pay: 20% Providers: pt choice SECONDARY:       Policy#:       Subscriber:  CM Name:       Phone#:      Fax#:  Pre-Cert#:       Employer:  Benefits:  Phone #:      Name:  Eff. Date:      Deduct:       Out of Pocket Max:       Life Max:  CIR:       SNF:  Outpatient:      Co-Pay:  Home Health:       Co-Pay:  DME:      Co-Pay:   Medicaid Application Date:       Case Manager:  Disability Application Date:       Case Worker:   The "Data Collection Information Summary" for patients in Inpatient Rehabilitation Facilities with attached "Privacy Act [place] Records" was provided and verbally reviewed with: Patient and Family  Emergency Contact Information Contact Information    Name Relation Home Work [place]  [id number]  [place] C  [phone]       Current Medical History  Patient Admitting Diagnosis: CVA   History of Present Illness: Eric Barron is a 69 year old right-handed male with prior history of CVA at age 20 with residual mild left-sided weakness, hypertension, hyperlipidemia and signs of early vascular dementia.  Patient currently not taking medications he stopped taking that approximately 1 year ago.  Presented 12/15/2019 with acute onset of gait unsteadiness.  Cranial CT scan  negative.  Patient did not receive TPA.  MRI showed small acute infarct right thalamocapsular region.  CT angiogram head and neck no emergent large vessel occlusion.  Echocardiogram with ejection fraction of 65% without emboli.  Admission chemistries unremarkable.  Maintained on aspirin and Plavix for CVA prophylaxis x3 weeks then aspirin alone.  Subcutaneous Lovenox for DVT prophylaxis.  Mechanical soft diet with nectar thick liquids.  Therapy evaluations completed and patient was recommended for a comprehensive rehab program.  Complete NIHSS TOTAL: 4  Past Medical History  No past medical history on file.  Family History  family history is not on file.  Prior Rehab/Hospitalizations:  Has the patient had prior rehab or hospitalizations prior to admission? No  Has the patient had major surgery during  100 days prior to admission? No  Current Medications   Current Facility-Administered Medications:  .  amLODipine (NORVASC) tablet 5 mg, 5 mg, Oral, Daily, Earlene Plater, MD, 5 mg at 12/18/19 0930 .  aspirin chewable tablet 81 mg, 81 mg, Oral, Daily, Agyei, Obed K, MD, 81 mg at 12/18/19 0930 .  atorvastatin (LIPITOR) tablet 80 mg, 80 mg, Oral, q1800, Agyei, Obed K, MD, 80 mg at 12/17/19 1736 .  clopidogrel (PLAVIX) tablet 75 mg, 75 mg, Oral, Daily, Biby, Sharon L, NP, 75 mg at 12/18/19 0930 .  enoxaparin (LOVENOX) injection 40 mg, 40 mg, Subcutaneous, Q24H, Agyei, Obed K, MD, 40 mg at 12/17/19 2117 .  labetalol (NORMODYNE) injection 10 mg, 10 mg, Intravenous, PRN, Marianna Payment, MD  Patients Current Diet:  Diet Order            DIET DYS 3 Room service appropriate? Yes; Fluid consistency: Nectar Thick  Diet effective now              Precautions / Restrictions Precautions Precautions: Fall Precaution Comments: CVA with residual left sided deficits Restrictions Weight Bearing Restrictions: No   Has the patient had 2 or more falls or a fall with injury in the past  year?No  Prior Activity Level Community (5-7x/wk): very active PTA, walking without AD several miles per day, not driving  Prior Functional Level Prior Function Level of Independence: Independent Comments: Pt independent in ADLs, IADLs, and mobility. Pt ambulates without an assistive device, up to 6 miles a day. 0 falls. Still drives.  Self Care: Did the patient need help bathing, dressing, using the toilet or eating?  Independent  Indoor Mobility: Did the patient need assistance with walking from room to room (with or without device)? Independent  Stairs: Did the patient need assistance with internal or external stairs (with or without device)? Independent  Functional Cognition: Did the patient need help planning regular tasks such as shopping or remembering to take medications? Independent  Home Assistive Devices / Equipment Home Assistive Devices/Equipment: Dentures (specify type), Eyeglasses, Hand-held shower hose, Scales Home Equipment: Shower seat, Cane - single point, Walker - 2 wheels, Grab bars - tub/shower  Prior Device Use: Indicate devices/aids used by the patient prior to current illness, exacerbation or injury? None of the above  Current Functional Level Cognition  Overall Cognitive Status: History of cognitive impairments - at baseline Orientation Level: Oriented to person, Oriented to place, Oriented to situation, Disoriented to time General Comments: Has difficulty following multi step commands, needs visual demonstration at times and repetition    Extremity Assessment (includes Sensation/Coordination)  Upper Extremity Assessment: Defer to OT evaluation RUE Deficits / Details: BUE ROM WFL. Weak bilaterally. Equal strength.  RUE Sensation: WNL LUE Deficits / Details: BUE ROM WFL. Weak bilaterally. Equal strength LUE Sensation: WNL  Lower Extremity Assessment: RLE deficits/detail, LLE deficits/detail RLE Deficits / Details: Strength 5/5 RLE Sensation: WNL RLE  Coordination: WNL LLE Deficits / Details: Strength 4/5 LLE Sensation: WNL LLE Coordination: WNL    ADLs  Overall ADL's : Needs assistance/impaired Eating/Feeding: Set up, Bed level Grooming: Minimal assistance, Standing Grooming Details (indicate cue type and reason): While standing at the sink. Assist for balance Upper Body Bathing: Minimal assistance, Sitting Upper Body Bathing Details (indicate cue type and reason): To maintain balance Lower Body Bathing: Minimal assistance, Moderate assistance, Sit to/from stand Lower Body Bathing Details (indicate cue type and reason): To maintain balance.  Upper Body Dressing : Minimal assistance, Sitting Lower Body Dressing: Minimal  assistance, Moderate assistance, Sitting/lateral leans, Sit to/from stand Toilet Transfer: Minimal assistance, Ambulation, Regular Toilet, Grab bars Toileting- Clothing Manipulation and Hygiene: Minimal assistance, Moderate assistance, Sit to/from stand Functional mobility during ADLs: Moderate assistance, Rolling walker General ADL Comments: Pt able to ambulate to/from bathroom with RW and mod assist. Noted several instances of retro and left lateral LOB with pt requiring assist to self-correct.    Mobility  Overal bed mobility: Modified Independent Bed Mobility: Supine to Sit, Sit to Supine Supine to sit: Supervision Sit to supine: Supervision General bed mobility comments: Use of bed rails. Assist for trunk control     Transfers  Overall transfer level: Needs assistance Equipment used: None Transfers: Sit to/from Stand Sit to Stand: Min guard Stand pivot transfers: Mod assist General transfer comment: Close min guard to initially steady upon rise from bed and toilet    Ambulation / Gait / Stairs / Wheelchair Mobility  Ambulation/Gait Ambulation/Gait assistance: Min assist, Mod assist Gait Distance (Feet): 100 Feet Assistive device: None Gait Pattern/deviations: Step-to pattern, Step-through pattern,  Decreased step length - left, Decreased stance time - left, Decreased stride length, Decreased dorsiflexion - left, Decreased dorsiflexion - right, Shuffle General Gait Details: Pt with initially 2-3 episodes of gross loss of balance laterally, requiring modA to correct. Utilizing step to pattern and progressing to step through pattern with minA in hallway. Pt demonstrates decreased bilateral heel strike, decreased LLE stance time/weightbearing, shortened L step length, and decreased reciprocal arm swing. Cues provided for heel strike at initial contact, putting R arm by side (tends to be in high guard position). Gait velocity: decreased    Posture / Balance Dynamic Sitting Balance Sitting balance - Comments: Pt tolerated sitting EOB 10+ min with variable supervision to min assist. Occasional retro lean with pt requiring multimodal cues and assist to self-correct.  Static Standing Balance Tandem Stance - Right Leg: 0 Tandem Stance - Left Leg: 10 Rhomberg - Eyes Opened: 10 Balance Overall balance assessment: Needs assistance Sitting-balance support: Feet supported Sitting balance-Leahy Scale: Good Sitting balance - Comments: Pt tolerated sitting EOB 10+ min with variable supervision to min assist. Occasional retro lean with pt requiring multimodal cues and assist to self-correct.  Standing balance support: No upper extremity supported, During functional activity Standing balance-Leahy Scale: Fair Tandem Stance - Right Leg: 0 Tandem Stance - Left Leg: 10 Rhomberg - Eyes Opened: 10    Special needs/care consideration BiPAP/CPAP no CPM no Continuous Drip IV no Dialysis no        Days n/a Life Vest no Oxygen no  Special Bed no Trach Size no Wound Vac (area) no      Location n/a Skin intact                        Bowel mgmt: continent Bladder mgmt: continent Diabetic mgmt no Behavioral consideration  no Chemo/radiation no     Previous Home Environment (from acute therapy  documentation) Living Arrangements: Spouse/significant other(Eric Barron) Available Help at Discharge: Family Type of Home: House Home Layout: One level Home Access: Stairs to enter Entrance Stairs-Rails: Right, Left Entrance Stairs-Number of Steps: 5 Bathroom Shower/Tub: Chiropodist: Standard Home Care Services: No  Discharge Living Setting Plans for Discharge Living Setting: Patient's home Type of Home at Discharge: House Discharge Home Layout: One level Discharge Home Access: Stairs to enter Entrance Stairs-Rails: Can reach both Entrance Stairs-Number of Steps: 5 Discharge Bathroom Shower/Tub: Tub/shower unit Discharge Bathroom Toilet: Standard Discharge Bathroom  Accessibility: Yes How Accessible: Accessible via walker Does the patient have any problems obtaining your medications?: (pt previously non-compliant with medications)  Social/Family/Support Systems Patient Roles: Spouse Anticipated Caregiver: spouse, Eric Barron, and grand daughter Anticipated Caregiver's Contact Information: Eric Barron [phone] Ability/Limitations of Caregiver: min assist Caregiver Availability: 24/7 Discharge Plan Discussed with Primary Caregiver: Yes Is Caregiver In Agreement with Plan?: Yes Does Caregiver/Family have Issues with Lodging/Transportation while Pt is in Rehab?: No   Goals/Additional Needs Patient/Family Goal for Rehab: PT/OT/SLP supervision to mod I Expected length of stay: 5-7 days Dietary Needs: d3/nectar Pt/Family Agrees to Admission and willing to participate: Yes Program Orientation Provided & Reviewed with Pt/Caregiver Including Roles  & Responsibilities: Yes   Decrease burden of Care through IP rehab admission: n/a   Possible need for SNF placement upon discharge: not anticipated.  Pt with excellent rehab prognosis and good family support.    Patient Condition: This patient's condition remains as documented in the consult dated 12/17/2019, in which the  Rehabilitation Physician determined and documented that the patient's condition is appropriate for intensive rehabilitative care in an inpatient rehabilitation facility. Will admit to inpatient rehab Saturday, 12/19/19.  Preadmission Screen Completed By:  Michel Santee, PT, DPT 12/18/2019 10:49 AM ______________________________________________________________________   Discussed status with Dr. Naaman Plummer on 12/18/19 at 10:52 AM  and received approval for admission today.  Admission Coordinator:  Michel Santee, PT, DPT, time 10:52 AM Sudie Grumbling 12/18/19

## 2019-12-18 NOTE — Progress Notes (Signed)
Physical Therapy Treatment Patient Details Name: Eric Barron MRN: [id number] DOB: [birth year] Today's Date: 12/18/2019    History of Present Illness 69 year old male with a history of CVA with residual left sided deficits, hypertension, hyperlipidemia, and signs of early vascular dementia presents to the emergency department with facial droop upon awakening and admitted with a small acute infarction of the right thalamic capsular region.      PT Comments    Patient progressing well towards PT goals. Continues to demonstrate left knee instability and decreased foot clearance during gait training especially when fatigued putting pt at increased risk for falls. Tolerated there ex focusing on functional strengthening of LEs. Requires Min A for balance challenges during ambulation esp with head turns. Highly motivated to return to PLOF. Will continue to follow.   Follow Up Recommendations  CIR;Supervision/Assistance - 24 hour     Equipment Recommendations  None recommended by PT    Recommendations for Other Services       Precautions / Restrictions Precautions Precautions: Fall Restrictions Weight Bearing Restrictions: No    Mobility  Bed Mobility   Bed Mobility: Supine to Sit;Sit to Supine     Supine to sit: Supervision;HOB elevated     General bed mobility comments: Use of rail, no physical assist needed.  Transfers Overall transfer level: Needs assistance Equipment used: None Transfers: Sit to/from Stand Sit to Stand: Min guard         General transfer comment: Close min guard for safety; initially using bed rail to stabilize LEs. Stood from EOB x10, Transferred to chair post ambulation.  Ambulation/Gait Ambulation/Gait assistance: Min assist Gait Distance (Feet): 130 Feet Assistive device: None Gait Pattern/deviations: Step-through pattern;Decreased step length - left;Decreased stance time - left;Decreased stride length;Decreased dorsiflexion -  left;Shuffle;Narrow base of support Gait velocity: decreased   General Gait Details: Slow, mildly unsteady gait esp with head turns; decreased LLE foot clearance esp when fatigued, cues to increase step lengths and clear LLE during swing phase. 2 standing rest breaks.   Stairs             Wheelchair Mobility    Modified Rankin (Stroke Patients Only) Modified Rankin (Stroke Patients Only) Pre-Morbid Rankin Score: No symptoms Modified Rankin: Moderately severe disability     Balance Overall balance assessment: Needs assistance Sitting-balance support: Feet supported;No upper extremity supported Sitting balance-Leahy Scale: Good Sitting balance - Comments: Able to reach outside BoS and donn shoes with supervision/setup.   Standing balance support: During functional activity Standing balance-Leahy Scale: Fair               High level balance activites: Head turns;Turns;Direction changes High Level Balance Comments: Tolerated above with minor deviations in gait or change in path requiring Min A for support.            Cognition Arousal/Alertness: Awake/alert Behavior During Therapy: WFL for tasks assessed/performed Overall Cognitive Status: History of cognitive impairments - at baseline                                 General Comments: Has difficulty following multi step commands, needs visual demonstration at times and repetition. HOH as well. "I am hard headed"      Exercises Other Exercises Other Exercises: Step ups with both right and left LE leading x 10 each with UE support Other Exercises: Lateral step ups x 10 BLEs Other Exercises: Sit to stand x10 emphasizing equal WB  through BLEs and slow descent with no use of UEs.    General Comments General comments (skin integrity, edema, etc.): VSS on RA, Pt's wife present during session.      Pertinent Vitals/Pain Pain Assessment: Faces Faces Pain Scale: Hurts a little bit Pain Location: RUE  with certain movements Pain Descriptors / Indicators: Sore Pain Intervention(s): Monitored during session    Home Living                      Prior Function            PT Goals (current goals can now be found in the care plan section) Progress towards PT goals: Progressing toward goals    Frequency    Min 4X/week      PT Plan Current plan remains appropriate    Co-evaluation              AM-PAC PT "6 Clicks" Mobility   Outcome Measure  Help needed turning from your back to your side while in a flat bed without using bedrails?: None Help needed moving from lying on your back to sitting on the side of a flat bed without using bedrails?: A Little Help needed moving to and from a bed to a chair (including a wheelchair)?: A Little Help needed standing up from a chair using your arms (e.g., wheelchair or bedside chair)?: A Little Help needed to walk in hospital room?: A Little Help needed climbing 3-5 steps with a railing? : A Little 6 Click Score: 19    End of Session Equipment Utilized During Treatment: Gait belt Activity Tolerance: Patient tolerated treatment well Patient left: in chair;with call bell/phone within reach;with family/visitor present Nurse Communication: Mobility status PT Visit Diagnosis: Unsteadiness on feet (R26.81);Hemiplegia and hemiparesis Hemiplegia - Right/Left: Left Hemiplegia - dominant/non-dominant: Non-dominant Hemiplegia - caused by: Cerebral infarction     Time: 1140-1206 PT Time Calculation (min) (ACUTE ONLY): 26 min  Charges:  $Gait Training: 8-22 mins $Therapeutic Exercise: 8-22 mins                     Marisa Severin, PT, DPT Acute Rehabilitation Services Pager [phone] Office [place] 12/18/2019, 12:16 PM

## 2019-12-18 NOTE — Progress Notes (Addendum)
Subjective:   Pt was seen at the bedside today.  Patient states he feels fine and is wondering when he can leave for rehab.  He has no complaints at this time.  Patient request that he return when his wife is able to visit, which should be around 11 AM to noon.  Objective: CBC Latest Ref Rng & Units 12/16/2019 12/15/2019 12/15/2019  WBC 4.0 - 10.5 K/uL 9.9 - 7.3  Hemoglobin 13.0 - 17.0 g/dL 15.5 15.6 15.2  Hematocrit 39.0 - 52.0 % 47.9 46.0 47.4  Platelets 150 - 400 K/uL 197 - 173   BMP Latest Ref Rng & Units 12/18/2019 12/16/2019 12/15/2019  Glucose 70 - 99 mg/dL 101(H) 112(H) 99  BUN 8 - 23 mg/dL 27(H) 14 14  Creatinine 0.61 - 1.24 mg/dL 1.05 0.96 0.80  Sodium 135 - 145 mmol/L 142 140 142  Potassium 3.5 - 5.1 mmol/L 3.5 3.9 3.7  Chloride 98 - 111 mmol/L 107 102 107  CO2 22 - 32 mmol/L 26 27 -  Calcium 8.9 - 10.3 mg/dL 9.1 9.7 -   Vital signs in last 24 hours: Vitals:   12/17/19 1737 12/17/19 1958 12/17/19 2321 12/18/19 0318  BP: (!) 163/81 (!) 190/85 (!) 183/84 (!) 174/89  Pulse: 76 65 66 63  Resp: 16 20 18 14   Temp: 98.6 F (37 C) 98.4 F (36.9 C) 98.2 F (36.8 C) 97.9 F (36.6 C)  TempSrc: Oral Oral Oral Oral  SpO2: 97% 98% 99% 98%   Physical Exam General: Laying in bed comfortably HEENT: NCAT, EOMI CV: Normal S1-S2, no murmurs rubs or gallops appreciated.  No peripheral edema PULM: Clear to auscultation bilaterally, no crackles or wheezes appreciated ABD: Soft and nontender in all quadrants.  Bowel sounds present. NEURO: Alert and oriented.  Answers questions appropriately.  5/5 strength in all extremities.  Sensation grossly intact in all extremities.  Assessment/Plan:  Principal Problem:   CVA (cerebral vascular accident) [facility]) Active Problems:   Essential hypertension   Hyperlipidemia   Prediabetes  Eric Barron is a 69 y.o. with pertinent PMH of HTN and previous CVA who presented with focal neurologic deficits and admit for acute R thalamic infarct.    #Acute R Thalamic Infarct: #HLD: Patient's deficits are currently with balance, left-sided weakness, and mild cognitive impairment.  MRI brain shows acute small right thalamic infarct.  CT angiogram of the head and neck was performed yesterday and showed bilateral proximal ICA atherosclerosis with less than 50% stenosis.  Echocardiogram unremarkable.  -Neurology signing off today, appreciate recommendations.  F/u in 6 weeks in stroke clinic. -Atorvastatin 80 mg daily -ASA 81 mg and plavix 75 mg x 3 weeks followed by ASA alone (stop date April 21) -PT/OT recommending CIR  #HTN: Does not take any medications at home.  BP persistently in the [id number] systolics.  Patient is now out of the permissive hypertensive window.  We will start antihypertensives with a goal of weaning to normotensive ranges within the 5 to 7 days. -Start amlodipine 5 mg today -IV labetalol 10 mg as needed for SBP > 220 or DBP >110  #Prediabetes: A1c of 6.0  -carb modified diet -f/u with PCP about controlling blood sugars  #FEN/GI -Diet: Dysphagia 3 -Fluids: None  #DVT prophylaxis -Lovenox 40 mg subq injections daily  #CODE STATUS: FULL  #Dispo: CIR tomorrow, discussed with Inpatient Rehab coordinator. Prior to Admission Living Arrangement: Home Anticipated Discharge Location: CIR Barriers to Discharge: Bed availability.  Bed will be open tomorrow.  Mitzi Hansen  Sheppard Coil, MD Internal Medicine, PGY1 Pager: [phone]  12/18/2019,10:42 AM

## 2019-12-18 NOTE — Progress Notes (Signed)
Inpatient Rehab Admissions Coordinator:   I have a bed for this pt to admit to CIR on Saturday (12/19/2019).  Dr. Sheppard Coil in agreement.  Rehab MD (Dr. Naaman Plummer) to assess pt and confirm admission on Saturday.  Floor RN can call CIR at [phone] for report after 12pm on Saturday.  I have let pt/family and case manager know.    Shann Medal, PT, DPT Admissions Coordinator [phone] 12/18/19  10:48 AM

## 2019-12-18 NOTE — H&P (Signed)
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  Patient currently not taking medications he stopped taking that approxi-1 year ago.  Per chart review lives with spouse.  1 level home 5 steps to entry.  Independent with ADLs ambulates without assistive device and still drives.  Presented 12/15/2019 with acute onset of gait unsteadiness.  Cranial CT scan negative.  Patient did not receive TPA.  MRI showed small acute infarct right thalamocapsular region.  CT angiogram head and neck no emergent large vessel occlusion.  Echocardiogram with ejection fraction of 65% without emboli.  Admission chemistries unremarkable.  Maintained on aspirin and Plavix for CVA prophylaxis x3 weeks then aspirin alone.  Subcutaneous Lovenox for DVT prophylaxis.  Mechanical soft diet with nectar thick liquids.  Therapy evaluations completed and patient was admitted for a comprehensive rehab program.  Review of Systems  Constitutional: Positive for weight loss. Negative for fever.  HENT: Negative for hearing loss.   Eyes: Negative for blurred vision and double vision.  Respiratory: Negative for cough and shortness of breath.   Cardiovascular: Negative for chest pain, palpitations and leg swelling.  Gastrointestinal: Positive for constipation. Negative for heartburn, nausea and vomiting.  Genitourinary: Negative for dysuria, flank pain and hematuria.  Musculoskeletal: Positive for myalgias.  Skin: Negative for rash.  Neurological: Positive for weakness.  All other systems reviewed and are negative.  No past medical history on file.   The histories are not reviewed yet. Please review them in the "History" navigator section and refresh this [place]. No family history on file. Social History:  has no history on  file for tobacco, alcohol, and drug. Allergies: No Known Allergies No medications prior to admission.    Drug Regimen Review Drug regimen was reviewed and remains appropriate with no significant issues identified  Home: Home Living Family/patient expects to be discharged to:: Private residence Living Arrangements: Spouse/significant other(Eric Barron) Available Help at Discharge: Family Type of Home: House Home Access: Stairs to enter Technical brewer of Steps: 5 Entrance Stairs-Rails: Right, Left Home Layout: One level Bathroom Shower/Tub: Chiropodist: Standard Home Equipment: Civil engineer, contracting, Radio producer - single point, Environmental consultant - 2 wheels, Grab bars - tub/shower   Functional History: Prior Function Level of Independence: Independent Comments: Pt independent in ADLs, IADLs, and mobility. Pt ambulates without an assistive device, up to 6 miles a day. 0 falls. Still drives.  Functional Status:  Mobility: Bed Mobility Overal bed mobility: Modified Independent Bed Mobility: Supine to Sit, Sit to Supine Supine to sit: Supervision Sit to supine: Supervision General bed mobility comments: Use of bed rails. Assist for trunk control  Transfers Overall transfer level: Needs assistance Equipment used: None Transfers: Sit to/from Stand Sit to Stand: Min guard Stand pivot transfers: Mod assist General transfer comment: Close min guard to initially steady upon rise from bed and toilet Ambulation/Gait Ambulation/Gait assistance: Min assist, Mod assist Gait Distance (Feet): 100 Feet Assistive device: None Gait Pattern/deviations: Step-to pattern, Step-through pattern, Decreased step length - left, Decreased stance time - left, Decreased stride length, Decreased dorsiflexion - left, Decreased dorsiflexion - right, Shuffle General Gait Details: Pt with initially 2-3 episodes of gross loss of balance laterally, requiring modA to correct. Utilizing step to pattern and progressing  to step through pattern with minA in hallway. Pt demonstrates decreased bilateral heel  strike, decreased LLE stance time/weightbearing, shortened L step length, and decreased reciprocal arm swing. Cues provided for heel strike at initial contact, putting R arm by side (tends to be in high guard position). Gait velocity: decreased    ADL: ADL Overall ADL's : Needs assistance/impaired Eating/Feeding: Set up, Bed level Grooming: Minimal assistance, Standing Grooming Details (indicate cue type and reason): While standing at the sink. Assist for balance Upper Body Bathing: Minimal assistance, Sitting Upper Body Bathing Details (indicate cue type and reason): To maintain balance Lower Body Bathing: Minimal assistance, Moderate assistance, Sit to/from stand Lower Body Bathing Details (indicate cue type and reason): To maintain balance.  Upper Body Dressing : Minimal assistance, Sitting Lower Body Dressing: Minimal assistance, Moderate assistance, Sitting/lateral leans, Sit to/from stand Toilet Transfer: Minimal assistance, Ambulation, Regular Toilet, Grab bars Toileting- Clothing Manipulation and Hygiene: Minimal assistance, Moderate assistance, Sit to/from stand Functional mobility during ADLs: Moderate assistance, Rolling walker General ADL Comments: Pt able to ambulate to/from bathroom with RW and mod assist. Noted several instances of retro and left lateral LOB with pt requiring assist to self-correct.  Cognition: Cognition Overall Cognitive Status: History of cognitive impairments - at baseline Orientation Level: Oriented to person, Oriented to place, Oriented to situation, Disoriented to time Cognition Arousal/Alertness: Awake/alert Behavior During Therapy: WFL for tasks assessed/performed Overall Cognitive Status: History of cognitive impairments - at baseline General Comments: Has difficulty following multi step commands, needs visual demonstration at times and repetition  Physical  Exam: Blood pressure (!) 174/89, pulse 63, temperature 97.9 F (36.6 C), temperature source Oral, resp. rate 14, SpO2 98 %. Physical Exam  Constitutional: He is oriented to person, place, and time. He appears well-developed and well-nourished. No distress.  HENT:  Head: Atraumatic.  Mouth/Throat: Oropharynx is clear and moist. No oropharyngeal exudate.  Eyes: Pupils are equal, round, and reactive to light. Conjunctivae and EOM are normal.  Neck: No tracheal deviation present. No thyromegaly present.  Cardiovascular: Normal rate, regular rhythm and normal heart sounds. Exam reveals no friction rub.  No murmur heard. Respiratory: Effort normal and breath sounds normal. No respiratory distress. He has no wheezes.  GI: Soft. He exhibits no distension. There is no abdominal tenderness. There is no rebound.  Musculoskeletal:        General: No edema. Normal range of motion.     Cervical back: Normal range of motion.  Neurological: He is alert and oriented to person, place, and time. No cranial nerve deficit.  Fair insight and awareness. Delayed processing. STM deficits. RUE 5/5. RLE 5/5. LUE 4/5. LLE 4+/5. Left PD and slight dysmetria of LUE,LLE. Senses pain and LT equally right to left.   Skin: Skin is warm and dry. He is not diaphoretic.  Psychiatric: He has a normal mood and affect. His behavior is normal.    Results for orders placed or performed during the hospital encounter of 12/15/19 (from the past 48 hour(s))  Basic metabolic panel     Status: Abnormal   Collection Time: 12/18/19  4:15 AM  Result Value Ref Range   Sodium 142 135 - 145 mmol/L   Potassium 3.5 3.5 - 5.1 mmol/L   Chloride 107 98 - 111 mmol/L   CO2 26 22 - 32 mmol/L   Glucose, Bld 101 (H) 70 - 99 mg/dL    Comment: Glucose reference range applies only to samples taken after fasting for at least 8 hours.   BUN 27 (H) 8 - 23 mg/dL   Creatinine, Ser 1.05 0.61 -  1.24 mg/dL   Calcium 9.1 8.9 - 10.3 mg/dL   GFR calc non Af  Amer >60 >60 mL/min   GFR calc Af Amer >60 >60 mL/min   Anion gap 9 5 - 15    Comment: Performed at [facility] [address] [place], [place] [address]   CT ANGIO HEAD W OR WO CONTRAST  Result Date: 12/16/2019 CLINICAL DATA:  Stroke follow-up EXAM: CT ANGIOGRAPHY HEAD AND NECK TECHNIQUE: Multidetector CT imaging of the head and neck was performed using the standard protocol during bolus administration of intravenous contrast. Multiplanar CT image reconstructions and MIPs were obtained to evaluate the vascular anatomy. Carotid stenosis measurements (when applicable) are obtained utilizing NASCET criteria, using the distal internal carotid diameter as the denominator. CONTRAST:  39mL OMNIPAQUE IOHEXOL 350 MG/ML SOLN COMPARISON:  None. FINDINGS: CTA NECK FINDINGS SKELETON: There is no bony spinal canal stenosis. No lytic or blastic lesion. OTHER NECK: Normal pharynx, larynx and major salivary glands. No cervical lymphadenopathy. Unremarkable thyroid gland. UPPER CHEST: No pneumothorax or pleural effusion. No nodules or masses. AORTIC ARCH: There is mild calcific atherosclerosis of the aortic arch. There is no aneurysm, dissection or hemodynamically significant stenosis of the visualized portion of the aorta. Conventional 3 vessel aortic branching pattern. The visualized proximal subclavian arteries are widely patent. RIGHT CAROTID SYSTEM: No dissection, occlusion or aneurysm. There is mixed density atherosclerosis extending into the proximal ICA, resulting in less than 50% stenosis. LEFT CAROTID SYSTEM: No dissection, occlusion or aneurysm. There is mixed density atherosclerosis extending into the proximal ICA, resulting in less than 50% stenosis. VERTEBRAL ARTERIES: Left dominant configuration. Both origins are clearly patent. There is no dissection, occlusion or flow-limiting stenosis to the skull base (V1-V3 segments). CTA HEAD FINDINGS POSTERIOR CIRCULATION: --Vertebral arteries: Normal V4  segments. --Posterior inferior cerebellar arteries (PICA): Patent origins from the vertebral arteries. --Anterior inferior cerebellar arteries (AICA): Patent origins from the basilar artery. --Basilar artery: Normal. --Superior cerebellar arteries: Normal. --Posterior cerebral arteries: Normal. Both originate from the basilar artery. Posterior communicating arteries (p-comm) are diminutive or absent. ANTERIOR CIRCULATION: --Intracranial internal carotid arteries: Normal. --Anterior cerebral arteries (ACA): Normal. Both A1 segments are present. Patent anterior communicating artery (a-comm). --Middle cerebral arteries (MCA): Normal. VENOUS SINUSES: As permitted by contrast timing, patent. ANATOMIC VARIANTS: None Review of the MIP images confirms the above findings. IMPRESSION: 1. No emergent large vessel occlusion or hemodynamically significant stenosis of the head or neck. 2. Bilateral proximal ICA atherosclerosis with less than 50% stenosis. Electronically Signed   By: Ulyses Jarred M.D.   On: 12/16/2019 19:38   CT ANGIO NECK W OR WO CONTRAST  Result Date: 12/16/2019 CLINICAL DATA:  Stroke follow-up EXAM: CT ANGIOGRAPHY HEAD AND NECK TECHNIQUE: Multidetector CT imaging of the head and neck was performed using the standard protocol during bolus administration of intravenous contrast. Multiplanar CT image reconstructions and MIPs were obtained to evaluate the vascular anatomy. Carotid stenosis measurements (when applicable) are obtained utilizing NASCET criteria, using the distal internal carotid diameter as the denominator. CONTRAST:  2mL OMNIPAQUE IOHEXOL 350 MG/ML SOLN COMPARISON:  None. FINDINGS: CTA NECK FINDINGS SKELETON: There is no bony spinal canal stenosis. No lytic or blastic lesion. OTHER NECK: Normal pharynx, larynx and major salivary glands. No cervical lymphadenopathy. Unremarkable thyroid gland. UPPER CHEST: No pneumothorax or pleural effusion. No nodules or masses. AORTIC ARCH: There is mild  calcific atherosclerosis of the aortic arch. There is no aneurysm, dissection or hemodynamically significant stenosis of the visualized portion  of the aorta. Conventional 3 vessel aortic branching pattern. The visualized proximal subclavian arteries are widely patent. RIGHT CAROTID SYSTEM: No dissection, occlusion or aneurysm. There is mixed density atherosclerosis extending into the proximal ICA, resulting in less than 50% stenosis. LEFT CAROTID SYSTEM: No dissection, occlusion or aneurysm. There is mixed density atherosclerosis extending into the proximal ICA, resulting in less than 50% stenosis. VERTEBRAL ARTERIES: Left dominant configuration. Both origins are clearly patent. There is no dissection, occlusion or flow-limiting stenosis to the skull base (V1-V3 segments). CTA HEAD FINDINGS POSTERIOR CIRCULATION: --Vertebral arteries: Normal V4 segments. --Posterior inferior cerebellar arteries (PICA): Patent origins from the vertebral arteries. --Anterior inferior cerebellar arteries (AICA): Patent origins from the basilar artery. --Basilar artery: Normal. --Superior cerebellar arteries: Normal. --Posterior cerebral arteries: Normal. Both originate from the basilar artery. Posterior communicating arteries (p-comm) are diminutive or absent. ANTERIOR CIRCULATION: --Intracranial internal carotid arteries: Normal. --Anterior cerebral arteries (ACA): Normal. Both A1 segments are present. Patent anterior communicating artery (a-comm). --Middle cerebral arteries (MCA): Normal. VENOUS SINUSES: As permitted by contrast timing, patent. ANATOMIC VARIANTS: None Review of the MIP images confirms the above findings. IMPRESSION: 1. No emergent large vessel occlusion or hemodynamically significant stenosis of the head or neck. 2. Bilateral proximal ICA atherosclerosis with less than 50% stenosis. Electronically Signed   By: Ulyses Jarred M.D.   On: 12/16/2019 19:38   DG Swallowing Func-Speech Pathology  Result Date:  12/16/2019 Objective Swallowing Evaluation: Type of Study: MBS-Modified Barium Swallow Study  Patient Details Name: Eric Barron MRN: [id number] Date of Birth: Jun 04, 1951 Today's Date: 12/16/2019 Time: SLP Start Time (ACUTE ONLY): 0900 -SLP Stop Time (ACUTE ONLY): 0925 SLP Time Calculation (min) (ACUTE ONLY): 25 min Past Medical History: No past medical history on file. Past Surgical History: HPI: 69 yo male adm to [facility] with confusion, disorientation.  Pt has h/o pontine CVA (left) and thalamic cva in 2003.  He is LEFT HANDED.  Pt also with h/o tobacco use.  Imaging study showed right thalamocapsular CVA.  Pt did not pass the [facility] screen and SLP was ordered.  SLP encountered pt awake and reporting hunger.  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  Cervical spine appears to impinge into pharynx/cervical esophagus preventing adequate epiglottic deflection as it contacts posterior pharyngeal wall thus contributing to pharyngeal retention.  Oral deficits with impaired oral bolus manipulation and decreased propulsion with oral residuals.  Pharyngeal swallow c/b decreased tongue base retraction, impaired laryngeal elevation/closure resulting in vallecular residuals and aspiration of thin (mostly silent - delayed cough with larger amount).  Chin tuck and head turn left/right did not prevent penetration or aspiration consistently nor did it decrease retention.  Reflexive dry swallows helpful to clear pharyngeal retention *secretions retained also.  Cued effortful swallow likely improved pharyngeal clearance due to improved muscle recruitment. Barium tablet swallowed with pudding halted in esophagus *appeared near aortic arch but radiologist not present to confirm*.  Of note, pt was not sensate to halted tablet.  Liquid swallows appeared to aid transit of  tablet into esophagus.  Pt was educated to all findings/recommendations live with monitor and with written precaution sign using teach back for clinical reasoning.  Pt reports his swallow ability to be at baseline - stating he has coughed with intake for as long as she can recall.  However given this new cva event with decreased mobilitization, congested cough and h/o smoking -  his aspiration pna risk will be higher.  Recommend dys3/nectar liquids and allow thin water between meals after oral care. SLP Visit Diagnosis Dysphagia, oropharyngeal phase (R13.12);Dysphagia, pharyngoesophageal phase (R13.14) Attention and concentration deficit following -- Frontal lobe and executive function deficit following -- Impact on safety and function Moderate aspiration risk   CHL IP TREATMENT RECOMMENDATION 12/16/2019 Treatment Recommendations Therapy as outlined in treatment plan below   Prognosis 12/16/2019 Prognosis for Safe Diet Advancement Fair Barriers to Reach Goals Time post onset Barriers/Prognosis Comment -- CHL IP DIET RECOMMENDATION 12/16/2019 SLP Diet Recommendations Dysphagia 3 (Mech soft) solids;Nectar thick liquid Liquid Administration via Cup;Straw Medication Administration Whole meds with puree Compensations Slow rate;Small sips/bites Postural Changes Remain semi-upright after after feeds/meals (Comment)   CHL IP OTHER RECOMMENDATIONS 12/16/2019 Recommended Consults -- Oral Care Recommendations Oral care BID Other Recommendations --   CHL IP FOLLOW UP RECOMMENDATIONS 12/16/2019 Follow up Recommendations Home health SLP   CHL IP FREQUENCY AND DURATION 12/16/2019 Speech Therapy Frequency (ACUTE ONLY) min 1 x/week Treatment Duration 1 week      CHL IP ORAL PHASE 12/16/2019 Oral Phase Impaired Oral - Pudding Teaspoon -- Oral - Pudding Cup -- Oral - Honey Teaspoon -- Oral - Honey Cup -- Oral - Nectar Teaspoon Reduced posterior propulsion;Premature spillage;Weak lingual manipulation Oral - Nectar Cup Reduced posterior  propulsion;Premature spillage;Weak lingual manipulation Oral - Nectar Straw Reduced posterior propulsion;Premature spillage;Weak lingual manipulation Oral - Thin Teaspoon Reduced posterior propulsion;Premature spillage;Weak lingual manipulation Oral - Thin Cup Reduced posterior propulsion;Premature spillage;Weak lingual manipulation Oral - Thin Straw Reduced posterior propulsion;Premature spillage;Weak lingual manipulation Oral - Puree Reduced posterior propulsion;Weak lingual manipulation;Premature spillage Oral - Mech Soft Reduced posterior propulsion;Weak lingual manipulation;Premature spillage Oral - Regular -- Oral - Multi-Consistency -- Oral - Pill Reduced posterior propulsion;Weak lingual manipulation;Premature spillage Oral Phase - Comment --  CHL IP PHARYNGEAL PHASE 12/16/2019 Pharyngeal Phase Impaired Pharyngeal- Pudding Teaspoon -- Pharyngeal -- Pharyngeal- Pudding Cup -- Pharyngeal -- Pharyngeal- Honey Teaspoon -- Pharyngeal -- Pharyngeal- Honey Cup -- Pharyngeal -- Pharyngeal- Nectar Teaspoon -- Pharyngeal -- Pharyngeal- Nectar Cup Reduced epiglottic inversion;Pharyngeal residue - valleculae;Reduced tongue base retraction Pharyngeal Material does not enter airway Pharyngeal- Nectar Straw Pharyngeal residue - valleculae;Reduced tongue base retraction;Reduced epiglottic inversion Pharyngeal Material does not enter airway Pharyngeal- Thin Teaspoon Reduced laryngeal elevation;Reduced anterior laryngeal mobility;Reduced epiglottic inversion;Reduced airway/laryngeal closure;Penetration/Aspiration during swallow;Delayed swallow initiation-pyriform sinuses;Reduced tongue base retraction Pharyngeal Material enters airway, CONTACTS cords and not ejected out Pharyngeal- Thin Cup Delayed swallow initiation-pyriform sinuses;Penetration/Aspiration during swallow;Trace aspiration;Pharyngeal residue - valleculae;Reduced epiglottic inversion;Reduced tongue base retraction Pharyngeal Material enters airway, passes BELOW  cords without attempt by patient to eject out (silent aspiration) Pharyngeal- Thin Straw Delayed swallow initiation-pyriform sinuses;Penetration/Aspiration during swallow;Trace aspiration;Moderate aspiration;Pharyngeal residue - valleculae;Reduced epiglottic inversion;Reduced tongue base retraction Pharyngeal -- Pharyngeal- Puree Reduced epiglottic inversion;Pharyngeal residue - valleculae;Reduced tongue base retraction Pharyngeal Material does not enter airway Pharyngeal- Mechanical Soft Reduced epiglottic inversion;Pharyngeal residue - valleculae;Reduced tongue base retraction Pharyngeal Material does not enter airway Pharyngeal- Regular -- Pharyngeal -- Pharyngeal- Multi-consistency -- Pharyngeal -- Pharyngeal- Pill Pharyngeal residue - valleculae;Reduced epiglottic inversion Pharyngeal Material does not enter airway Pharyngeal Comment various postures including head turn right/left with and without chin tuck were not helpful to prevent residuals not penetration/aspiration, cued cough did not clear deeper aspirates, aspiration was mild, anatomical abnormality *appeararance of cervical spine degenerative dx vs osteophytes protruding into pharynx diminished epiglottic deflection and contributed to residuals  CHL IP CERVICAL ESOPHAGEAL PHASE 12/16/2019 Cervical Esophageal Phase Impaired Pudding Teaspoon -- Pudding  Cup -- Honey Teaspoon -- Honey Cup -- Nectar Teaspoon -- Nectar Cup -- Nectar Straw -- Thin Teaspoon -- Thin Cup -- Thin Straw -- Puree -- Mechanical Soft -- Regular -- Multi-consistency -- Pill -- Cervical Esophageal Comment Barium tablet swallowed with pudding halted in esophagus *appeared near aortic arch but radiologist not present to confirm*.  Of note, pt was not sensate to halted tablet.  Liquid swallows appeared to aid transit of tablet into esophagus Eric Barron 12/16/2019, 10:24 AM  Eric Lime, MS [facility] SLP Acute Rehab Services Office [phone]             ECHOCARDIOGRAM COMPLETE BUBBLE  STUDY  Result Date: 12/16/2019    ECHOCARDIOGRAM REPORT   Patient Name:   Eric Barron Date of Exam: 12/16/2019 Medical Rec #:  [id number]        Height: Accession #:    [id number]       Weight: Date of Birth:  05-04-1951       BSA: Patient Age:    77 years         BP:           211/91 mmHg Patient Gender: M                HR:           57 bpm. Exam Location:  Inpatient Procedure: 2D Echo, Cardiac Doppler and Color Doppler Indications:    CVA  History:        Patient has no prior history of Echocardiogram examinations.                 Stroke; Risk Factors:Hypertension.  Sonographer:    Dustin Flock Referring Phys: Eric Barron  1. Left ventricular ejection fraction, by estimation, is 60 to 65%. The left ventricle has normal function. The left ventricle has no regional wall motion abnormalities. There is moderate concentric left ventricular hypertrophy. Left ventricular diastolic parameters are indeterminate.  2. Right ventricular systolic function is normal. The right ventricular size is normal.  3. The mitral valve is normal in structure. Trivial mitral valve regurgitation. No evidence of mitral stenosis.  4. The aortic valve is grossly normal. Aortic valve regurgitation is not visualized. No aortic stenosis is present.  5. The inferior vena cava is normal in size with <50% respiratory variability, suggesting right atrial pressure of 8 mmHg.  6. Agitated saline contrast bubble study was negative, with no evidence of any interatrial shunt. Comparison(s): No prior Echocardiogram. Conclusion(s)/Recommendation(s): No intracardiac source of embolism detected on this transthoracic study. A transesophageal echocardiogram is recommended to exclude cardiac source of embolism if clinically indicated. FINDINGS  Left Ventricle: Left ventricular ejection fraction, by estimation, is 60 to 65%. The left ventricle has normal function. The left ventricle has no regional wall motion abnormalities. The  left ventricular internal cavity size was normal in size. There is  moderate concentric left ventricular hypertrophy. Left ventricular diastolic parameters are indeterminate. Right Ventricle: The right ventricular size is normal. No increase in right ventricular wall thickness. Right ventricular systolic function is normal. Left Atrium: Left atrial size was normal in size. Right Atrium: Right atrial size was normal in size. Pericardium: There is no evidence of pericardial effusion. Mitral Valve: The mitral valve is normal in structure. Trivial mitral valve regurgitation. No evidence of mitral valve stenosis. Tricuspid Valve: The tricuspid valve is normal in structure. Tricuspid valve regurgitation is trivial. No evidence of tricuspid stenosis. Aortic Valve: The aortic valve is  grossly normal. Aortic valve regurgitation is not visualized. No aortic stenosis is present. Pulmonic Valve: The pulmonic valve was not well visualized. Pulmonic valve regurgitation is not visualized. No evidence of pulmonic stenosis. Aorta: The aortic root, ascending aorta and aortic arch are all structurally normal, with no evidence of dilitation or obstruction. Pulmonary Artery: The pulmonary artery is not well seen. Venous: The inferior vena cava is normal in size with less than 50% respiratory variability, suggesting right atrial pressure of 8 mmHg. IAS/Shunts: No atrial level shunt detected by color flow Doppler. Agitated saline contrast was given intravenously to evaluate for intracardiac shunting. Agitated saline contrast bubble study was negative, with no evidence of any interatrial shunt.  LEFT VENTRICLE PLAX 2D LVIDd:         4.50 cm  Diastology LVIDs:         2.90 cm  LV e' lateral:   8.38 cm/s LV PW:         1.50 cm  LV E/e' lateral: 6.9 LV IVS:        1.50 cm  LV e' medial:    5.11 cm/s LVOT diam:     2.00 cm  LV E/e' medial:  11.4 LV SV:         68 LVOT Area:     3.14 cm  RIGHT VENTRICLE RV Basal diam:  2.90 cm RV S prime:      14.30 cm/s TAPSE (M-mode): 2.4 cm LEFT ATRIUM LA diam:        3.50 cm LA Vol (A2C):   47.4 ml LA Vol (A4C):   38.5 ml LA Biplane Vol: 46.8 ml  AORTIC VALVE LVOT Vmax:   105.00 cm/s LVOT Vmean:  66.500 cm/s LVOT VTI:    0.217 m  AORTA Ao Root diam: 2.90 cm MITRAL VALVE MV Area (PHT): 1.98 cm    SHUNTS MV Decel Time: 384 msec    Systemic VTI:  0.22 m MV E velocity: 58.20 cm/s  Systemic Diam: 2.00 cm MV A velocity: 88.30 cm/s MV E/A ratio:  0.66 Buford Dresser MD Electronically signed by Buford Dresser MD Signature Date/Time: 12/16/2019/4:24:11 PM    Final        Medical Problem List and Plan: 1.  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  Antithrombotics: -DVT/anticoagulation: Lovenox.  -antiplatelet therapy: Aspirin 81 mg daily and Plavix 75 mg daily x3 weeks then aspirin alone (stop date 4/21) 3. Pain Management: Tylenol as needed 4. Mood: Provide emotional support  -antipsychotic agents: N/A 5. Neuropsych: This patient is capable of making decisions on his own behalf. 6. Skin/Wound Care: Routine skin checks 7. Fluids/Electrolytes/Nutrition: Routine in and outs with follow-up chemistries 8.  Hyperlipidemia.  Lipitor 9.  Dysphagia.  Dysphagia #3 nectar liquids.  Follow-up speech therapy 10.  Medical noncompliance.  Counseling      Cathlyn Parsons, PA-C 12/18/2019

## 2019-12-19 ENCOUNTER — Inpatient Hospital Stay (HOSPITAL_COMMUNITY)
Admission: RE | Admit: 2019-12-19 | Discharge: 2019-12-25 | DRG: 057 | Disposition: A | Discharge status: Home Health | Payer: Medicare Other | Attending: Physical Medicine & Rehabilitation | Admitting: Physical Medicine & Rehabilitation

## 2019-12-19 ENCOUNTER — Other Ambulatory Visit: Payer: Self-pay

## 2019-12-19 ENCOUNTER — Encounter (HOSPITAL_COMMUNITY): Payer: Self-pay | Admitting: Physical Medicine & Rehabilitation

## 2019-12-19 DIAGNOSIS — I69354 Hemiplegia and hemiparesis following cerebral infarction affecting left non-dominant side: Secondary | ICD-10-CM | POA: Diagnosis present

## 2019-12-19 DIAGNOSIS — I69319 Unspecified symptoms and signs involving cognitive functions following cerebral infarction: Secondary | ICD-10-CM | POA: Diagnosis not present

## 2019-12-19 DIAGNOSIS — R1310 Dysphagia, unspecified: Secondary | ICD-10-CM | POA: Diagnosis present

## 2019-12-19 DIAGNOSIS — I639 Cerebral infarction, unspecified: Secondary | ICD-10-CM | POA: Diagnosis present

## 2019-12-19 DIAGNOSIS — Z23 Encounter for immunization: Secondary | ICD-10-CM | POA: Diagnosis not present

## 2019-12-19 DIAGNOSIS — I69391 Dysphagia following cerebral infarction: Secondary | ICD-10-CM | POA: Diagnosis not present

## 2019-12-19 DIAGNOSIS — I69322 Dysarthria following cerebral infarction: Secondary | ICD-10-CM | POA: Diagnosis not present

## 2019-12-19 DIAGNOSIS — E785 Hyperlipidemia, unspecified: Secondary | ICD-10-CM | POA: Diagnosis present

## 2019-12-19 DIAGNOSIS — F0150 Vascular dementia without behavioral disturbance: Secondary | ICD-10-CM | POA: Diagnosis present

## 2019-12-19 DIAGNOSIS — Z9114 Patient's other noncompliance with medication regimen: Secondary | ICD-10-CM | POA: Diagnosis not present

## 2019-12-19 DIAGNOSIS — I10 Essential (primary) hypertension: Secondary | ICD-10-CM | POA: Diagnosis present

## 2019-12-19 DIAGNOSIS — I6381 Other cerebral infarction due to occlusion or stenosis of small artery: Secondary | ICD-10-CM | POA: Diagnosis present

## 2019-12-19 HISTORY — DX: Dysarthria following cerebral infarction: I69.322

## 2019-12-19 HISTORY — DX: Malignant neoplasm of connective and soft tissue of right lower limb, including hip: C49.21

## 2019-12-19 HISTORY — DX: Essential (primary) hypertension: I10

## 2019-12-19 LAB — CBC
HCT: 45.7 % (ref 39.0–52.0)
Hemoglobin: 14.5 g/dL (ref 13.0–17.0)
MCH: 29 pg (ref 26.0–34.0)
MCHC: 31.7 g/dL (ref 30.0–36.0)
MCV: 91.4 fL (ref 80.0–100.0)
Platelets: 176 10*3/uL (ref 150–400)
RBC: 5 MIL/uL (ref 4.22–5.81)
RDW: 13.6 % (ref 11.5–15.5)
WBC: 9 10*3/uL (ref 4.0–10.5)
nRBC: 0 % (ref 0.0–0.2)

## 2019-12-19 LAB — CREATININE, SERUM
Creatinine, Ser: 1 mg/dL (ref 0.61–1.24)
GFR calc Af Amer: >60 mL/min (ref >60)
GFR calc non Af Amer: >60 mL/min (ref >60)

## 2019-12-19 MED ORDER — SORBITOL 70 % SOLN: 30.0000 mL | Freq: Every day | Status: DC | PRN

## 2019-12-19 MED ORDER — ENOXAPARIN SODIUM 40 MG/0.4ML ~~LOC~~ SOLN: 40.0000 mg | SUBCUTANEOUS | Status: DC

## 2019-12-19 MED ORDER — CLOPIDOGREL BISULFATE 75 MG PO TABS
75.0000 mg | ORAL_TABLET | Freq: Every day | ORAL | Status: DC
  Administered 2019-12-20 – 2019-12-25 (×6): 75 mg via ORAL
  Filled 2019-12-19 (×6): qty 1

## 2019-12-19 MED ORDER — AMLODIPINE BESYLATE 10 MG PO TABS: 10.0000 mg | ORAL_TABLET | Freq: Every day | ORAL | Status: DC

## 2019-12-19 MED ORDER — ENOXAPARIN SODIUM 40 MG/0.4ML ~~LOC~~ SOLN
40.0000 mg | SUBCUTANEOUS | Status: DC
  Administered 2019-12-19 – 2019-12-24 (×6): 40 mg via SUBCUTANEOUS
  Filled 2019-12-19 (×6): qty 0.4

## 2019-12-19 MED ORDER — ASPIRIN 81 MG PO CHEW: 81.0000 mg | CHEWABLE_TABLET | Freq: Every day | ORAL | Status: AC

## 2019-12-19 MED ORDER — ASPIRIN 81 MG PO CHEW
81.0000 mg | CHEWABLE_TABLET | Freq: Every day | ORAL | Status: DC
  Administered 2019-12-20 – 2019-12-25 (×6): 81 mg via ORAL
  Filled 2019-12-19 (×6): qty 1

## 2019-12-19 MED ORDER — LABETALOL HCL 100 MG PO TABS
100.0000 mg | ORAL_TABLET | Freq: Three times a day (TID) | ORAL | Status: DC | PRN
  Administered 2019-12-19: 100 mg via ORAL
  Filled 2019-12-19: qty 1

## 2019-12-19 MED ORDER — ATORVASTATIN CALCIUM 80 MG PO TABS: 80.0000 mg | ORAL_TABLET | Freq: Every day | ORAL | Status: DC

## 2019-12-19 MED ORDER — AMLODIPINE BESYLATE 10 MG PO TABS
10.0000 mg | ORAL_TABLET | Freq: Every day | ORAL | Status: DC
  Administered 2019-12-19: 10 mg via ORAL
  Filled 2019-12-19: qty 1

## 2019-12-19 MED ORDER — ATORVASTATIN CALCIUM 80 MG PO TABS
80.0000 mg | ORAL_TABLET | Freq: Every day | ORAL | Status: DC
  Administered 2019-12-19 – 2019-12-24 (×6): 80 mg via ORAL
  Filled 2019-12-19 (×6): qty 1

## 2019-12-19 MED ORDER — CLOPIDOGREL BISULFATE 75 MG PO TABS: 75.0000 mg | ORAL_TABLET | Freq: Every day | ORAL | Status: DC

## 2019-12-19 NOTE — Discharge Instructions (Signed)
Cognitive Rehabilitation After a Stroke After a stroke, you may have various problems with thinking (cognitive disability). The types of problems you have will depend on how severe the stroke was and where it was located in the brain. Problems may include:  Problems with short-term memory.  Trouble paying attention.  Trouble communicating or understanding language (aphasia).  A drop in mental ability that may interfere with daily life (dementia).  Trouble with problem-solving and information processing.  Problems with reading, writing, or math.  Problems with your ability to plan and to perform activities in sequence (executive function). These problems can feel overwhelming. However, with rehabilitation and time to heal, many people have improvement in their symptoms. What causes cognitive disability? A stroke happens when blood cannot flow to certain areas of the brain. When this happens, brain cells die in the affected areas because they cannot get oxygen and nutrients from the blood. Cognitive disability is caused by the death of cells in the areas of the brain that control thinking. What is cognitive rehabilitation? Cognitive rehabilitation is a program to help you improve your thinking skills after a stroke. Rehabilitation cannot completely reverse the effects of a stroke, but it can help you with memory, problem-solving, and communication skills. Therapy focuses on:  Improving brain function. This may involve activities such as learning to break down tasks into simple steps.  Helping you learn ways to cope with thinking problems. For example, you might learn memory tricks or do activities that stimulate memory, such as naming objects or describing pictures. Cognitive rehabilitation may include:  Speech-language therapy to help you understand and use language to communicate.  Occupational therapy to help you perform daily activities.  Music therapy to help relieve stress,  anxiety, and depression. This may involve listening to music, singing, or playing instruments.  Physical therapy to help improve your ability to move and perform actions that involve the muscles (motor functions). When will therapy start and where will I have therapy? Your health care provider will decide when it is best for you to start therapy. In some cases, people start rehabilitation as soon as their health is stable, which may be 24-48 hours after the stroke. Rehabilitation can take place in a few different places, based on your needs. It may take place in:  The hospital or an in-patient rehabilitation hospital.  An outpatient rehabilitation facility.  A long-term care facility.  A community rehabilitation clinic.  Your home. What are some tools to help after a stroke? There are a number of tools and apps that you can use on your smartphone, personal computer, or tablet to help improve brain function. Some of these apps include:  Calendar reminders or alarm apps to help with memory.  Note-taking or sketch pad apps to help with memory or communication.  Text-to-speech apps that allow you to listen to what you are reading, which helps your ability to understanding text.  Picture dictionary or picture message apps to help with communication.  E-readers. These can highlight text as it is read aloud, which helps with listening and reading skills. How can my friends or family help during my rehabilitation? During your recovery, it is important that your friends and family members help you work toward more independence. Your caregivers should speak with your health care providers to learn how they can best help you during recovery. This may include working on speech-language or memory exercises at home, or helping with daily tasks and errands. If you have cognitive disability, you may   be at risk for injury or accidents at home, such as forgetting to turn off the stove. Friends and family  members can help ensure home safety by taking steps such as getting appliances with automatic shut-off features or storing dangerous objects in a secure place. What else should I know about cognitive rehabilitation after a stroke? Having trouble with memory and problem-solving can make you feel alone. You may also have mood changes, anxiety, or depression after a stroke. It is important to:  Stay connected with others through social groups, online support groups, or your community.  Talk to your friends, family, and caregivers about any emotional problems you are having.  Go to one-on-one or group therapy as suggested by your health care provider.  Stay physically active and exercise as often as suggested by your health care provider. Summary  After a stroke, some people have problems with thinking that affect attention, memory, language, communication, and problem-solving.  Cognitive rehabilitation is a program to help you regain brain function and learn skills to cope with thinking problems.  Rehabilitation cannot completely reverse the effects of a stroke, but it can help to improve quality of life.  Cognitive rehabilitation may include speech-language therapy, occupational therapy, music therapy, and physical therapy. This information is not intended to replace advice given to you by your health care provider. Make sure you discuss any questions you have with your health care provider. Document Revised: 12/24/2018 Document Reviewed: 12/07/2016 Elsevier Patient Education  [place].   Hypertension, Adult Hypertension is another name for high blood pressure. High blood pressure forces your heart to work harder to pump blood. This can cause problems over time. There are two numbers in a blood pressure reading. There is a top number (systolic) over a bottom number (diastolic). It is best to have a blood pressure that is below 120/80. Healthy choices can help lower your blood  pressure, or you may need medicine to help lower it. What are the causes? The cause of this condition is not known. Some conditions may be related to high blood pressure. What increases the risk?  Smoking.  Having type 2 diabetes mellitus, high cholesterol, or both.  Not getting enough exercise or physical activity.  Being overweight.  Having too much fat, sugar, calories, or salt (sodium) in your diet.  Drinking too much alcohol.  Having long-term (chronic) kidney disease.  Having a family history of high blood pressure.  Age. Risk increases with age.  Race. You may be at higher risk if you are African American.  Gender. Men are at higher risk than women before age 45. After age 86, women are at higher risk than men.  Having obstructive sleep apnea.  Stress. What are the signs or symptoms?  High blood pressure may not cause symptoms. Very high blood pressure (hypertensive crisis) may cause: ? Headache. ? Feelings of worry or nervousness (anxiety). ? Shortness of breath. ? Nosebleed. ? A feeling of being sick to your stomach (nausea). ? Throwing up (vomiting). ? Changes in how you see. ? Very bad chest pain. ? Seizures. How is this treated?  This condition is treated by making healthy lifestyle changes, such as: ? Eating healthy foods. ? Exercising more. ? Drinking less alcohol.  Your health care provider may prescribe medicine if lifestyle changes are not enough to get your blood pressure under control, and if: ? Your top number is above 130. ? Your bottom number is above 80.  Your personal target blood pressure may  vary. Follow these instructions at home: Eating and drinking   If told, follow the DASH eating plan. To follow this plan: ? Fill one half of your plate at each meal with fruits and vegetables. ? Fill one fourth of your plate at each meal with whole grains. Whole grains include whole-wheat pasta, brown rice, and whole-grain bread. ? Eat or  drink low-fat dairy products, such as skim milk or low-fat yogurt. ? Fill one fourth of your plate at each meal with low-fat (lean) proteins. Low-fat proteins include fish, chicken without skin, eggs, beans, and tofu. ? Avoid fatty meat, cured and processed meat, or chicken with skin. ? Avoid pre-made or processed food.  Eat less than 1,500 mg of salt each day.  Do not drink alcohol if: ? Your doctor tells you not to drink. ? You are pregnant, may be pregnant, or are planning to become pregnant.  If you drink alcohol: ? Limit how much you use to:  0-1 drink a day for women.  0-2 drinks a day for men. ? Be aware of how much alcohol is in your drink. In the [place] one drink equals one 12 oz bottle of beer (355 mL), one 5 oz glass of wine (148 mL), or one 1 oz glass of hard liquor (44 mL). Lifestyle   Work with your doctor to stay at a healthy weight or to lose weight. Ask your doctor what the best weight is for you.  Get at least 30 minutes of exercise most days of the week. This may include walking, swimming, or biking.  Get at least 30 minutes of exercise that strengthens your muscles (resistance exercise) at least 3 days a week. This may include lifting weights or doing Pilates.  Do not use any products that contain nicotine or tobacco, such as cigarettes, e-cigarettes, and chewing tobacco. If you need help quitting, ask your doctor.  Check your blood pressure at home as told by your doctor.  Keep all follow-up visits as told by your doctor. This is important. Medicines  Take over-the-counter and prescription medicines only as told by your doctor. Follow directions carefully.  Do not skip doses of blood pressure medicine. The medicine does not work as well if you skip doses. Skipping doses also puts you at risk for problems.  Ask your doctor about side effects or reactions to medicines that you should watch for. Contact a doctor if you:  Think you are having a reaction to  the medicine you are taking.  Have headaches that keep coming back (recurring).  Feel dizzy.  Have swelling in your ankles.  Have trouble with your vision. Get help right away if you:  Get a very bad headache.  Start to feel mixed up (confused).  Feel weak or numb.  Feel faint.  Have very bad pain in your: ? Chest. ? Belly (abdomen).  Throw up more than once.  Have trouble breathing. Summary  Hypertension is another name for high blood pressure.  High blood pressure forces your heart to work harder to pump blood.  For most people, a normal blood pressure is less than 120/80.  Making healthy choices can help lower blood pressure. If your blood pressure does not get lower with healthy choices, you may need to take medicine. This information is not intended to replace advice given to you by your health care provider. Make sure you discuss any questions you have with your health care provider. Document Revised: 05/14/2018 Document Reviewed: 05/14/2018 Elsevier Patient Education  [place] After Stroke A stroke causes damage to the brain cells, which can affect your ability to walk, talk, and even eat. The impact of a stroke is different for everyone, and so is recovery. A good nutrition plan is important for your recovery. It can also lower your risk of another stroke. If you have difficulty chewing and swallowing your food, a dietitian or your stroke care team can help so that you can enjoy eating healthy foods. What are tips for following this plan?  Reading food labels  Choose foods that have less than 300 milligrams (mg) of sodium per serving. Limit your sodium intake to less than 1,500 mg per day.  Avoid foods that have saturated fat and trans fat.  Choose foods that are low in cholesterol. Limit the amount of cholesterol you eat each day to less than 200 mg.  Choose foods that are high in fiber. Eat 20-30 grams (g) of fiber each  day.  Avoid foods with added sugar. Check the food label for ingredients such as sugar, corn syrup, honey, fructose, molasses, and cane juice. Shopping  At the grocery store, buy most of your food from areas near the walls of the store. This includes: ? Fresh fruits and vegetables. ? Dry grains, beans, nuts, and seeds. ? Fresh seafood, poultry, lean meats, and eggs. ? Low-fat dairy products.  Buy whole ingredients instead of prepackaged foods.  Buy fresh, in-season fruits and vegetables from local farmers markets.  Buy frozen fruits and vegetables in resealable bags. Cooking  Prepare foods with very little salt. Use herbs or salt-free spices instead.  Cook with heart-healthy oils, such as olive, avocado, canola, soybean, or sunflower oil.  Avoid frying foods. Bake, grill, or broil foods instead.  Remove visible fat and skin from meat and poultry before eating.  Modify food textures as told by your health care provider. Meal planning  Eat a wide variety of colorful fruits and vegetables. Make sure one-half of your plate is filled with fruits and vegetables at each meal.  Eat fruits and vegetables that are high in potassium, such as: ? Apples, bananas, oranges, and melon. ? Sweet potatoes, spinach, zucchini, and tomatoes.  Eat fish that contain heart-healthy fats (omega-3 fats) at least twice a week. These include salmon, tuna, mackerel, and sardines.  Eat plant foods that are high in omega-3 fats, such as flaxseeds and walnuts. Add these to cereals, yogurt, or pasta dishes.  Eat several servings of high-fiber foods each day, such as fruits, vegetables, whole grains, and beans.  Do not put salt at the table for meals.  When eating out at restaurants: ? Ask the server about low-salt or salt-free food options. ? Avoid fried foods. Look for menu items that are grilled, steamed, broiled, or roasted. ? Ask if your food can be prepared without butter. ? Ask for condiments, such  as salad dressings, gravy, or sauces to be served on the side.  If you have difficulty swallowing: ? Choose foods that are softer and easier to chew and swallow. ? Cut foods into small pieces and chew well before swallowing. ? Thicken liquids as told by your health care provider or dietitian. ? Let your health care provider know if your condition does not improve over time. You may need to work with a speech therapist to re-train the muscles that are used for eating. General recommendations  Involve your family and friends in your recovery, if possible. It may be helpful  to have a slower meal time and to plan meals that include foods everyone in the family can eat.  Brush your teeth with fluoride toothpaste twice a day, and floss once a day. Keeping a clean mouth can help you swallow and can also help your appetite.  Drink enough water each day to keep your urine pale yellow. If needed, set reminders or ask your family to help you remember to drink water.  Limit alcohol intake to no more than 1 drink a day for nonpregnant women and 2 drinks a day for men. One drink equals 12 oz of beer, 5 oz of wine, or 1 oz of hard liquor. Summary  Following this eating plan can help in your stroke recovery and can decrease your risk for another stroke.  Let your health care provider know if you have problems with swallowing. You may need to work with a speech therapist. This information is not intended to replace advice given to you by your health care provider. Make sure you discuss any questions you have with your health care provider. Document Revised: 12/25/2018 Document Reviewed: 11/11/2017 Elsevier Patient Education  [place].

## 2019-12-19 NOTE — Progress Notes (Signed)
Pt arrived to [facility] from [id number]. Report received from Jenny Reichmann, RN and brought to room by Nurse tech. Ppt's wife Jackelyn Poling accompanied pt. Pt oriented to unit and room with expectations to call using call light, visitor and mask policy, and therapy expectations. Vital completed, elevated SBP 180, which from note and RN report is typical for pt. Will continue to monitor. Bed alarm on, in low position with call light in reach. All questions answered. Will start care plan.

## 2019-12-19 NOTE — H&P (Signed)
Physical Medicine and Rehabilitation Admission H&P       HPI: Eric Commerford. 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  Patient currently not taking medications he stopped taking that approxi-1 year ago.  Per chart review lives with spouse.  1 level home 5 steps to entry.  Independent with ADLs ambulates without assistive device and still drives.  Presented 12/15/2019 with acute onset of gait unsteadiness.  Cranial CT scan negative.  Patient did not receive TPA.  MRI showed small acute infarct right thalamocapsular region.  CT angiogram head and neck no emergent large vessel occlusion.  Echocardiogram with ejection fraction of 65% without emboli.  Admission chemistries unremarkable.  Maintained on aspirin and Plavix for CVA prophylaxis x3 weeks then aspirin alone.  Subcutaneous Lovenox for DVT prophylaxis.  Mechanical soft diet with nectar thick liquids.  Therapy evaluations completed and patient was admitted for a comprehensive rehab program.   Review of Systems  Constitutional: Positive for weight loss. Negative for fever.  HENT: Negative for hearing loss.   Eyes: Negative for blurred vision and double vision.  Respiratory: Negative for cough and shortness of breath.   Cardiovascular: Negative for chest pain, palpitations and leg swelling.  Gastrointestinal: Positive for constipation. Negative for heartburn, nausea and vomiting.  Genitourinary: Negative for dysuria, flank pain and hematuria.  Musculoskeletal: Positive for myalgias.  Skin: Negative for rash.  Neurological: Positive for weakness.  All other systems reviewed and are negative.   No past medical history on file.   The histories are not reviewed yet. Please review them in the "History" navigator section and refresh this [place]. No family history on file. Social History:  has no history  on file for tobacco, alcohol, and drug. Allergies: No Known Allergies No medications prior to admission.      Drug Regimen Review Drug regimen was reviewed and remains appropriate with no significant issues identified   Home: Home Living Family/patient expects to be discharged to:: Private residence Living Arrangements: Spouse/significant other(Debby) Available Help at Discharge: Family Type of Home: House Home Access: Stairs to enter Technical brewer of Steps: 5 Entrance Stairs-Rails: Right, Left Home Layout: One level Bathroom Shower/Tub: Chiropodist: Standard Home Equipment: Civil engineer, contracting, Radio producer - single point, Environmental consultant - 2 wheels, Grab bars - tub/shower   Functional History: Prior Function Level of Independence: Independent Comments: Pt independent in ADLs, IADLs, and mobility. Pt ambulates without an assistive device, up to 6 miles a day. 0 falls. Still drives.   Functional Status:  Mobility: Bed Mobility Overal bed mobility: Modified Independent Bed Mobility: Supine to Sit, Sit to Supine Supine to sit: Supervision Sit to supine: Supervision General bed mobility comments: Use of bed rails. Assist for trunk control  Transfers Overall transfer level: Needs assistance Equipment used: None Transfers: Sit to/from Stand Sit to Stand: Min guard Stand pivot transfers: Mod assist General transfer comment: Close min guard to initially steady upon rise from bed and toilet Ambulation/Gait Ambulation/Gait assistance: Min assist, Mod assist Gait Distance (Feet): 100 Feet Assistive device: None Gait Pattern/deviations: Step-to pattern, Step-through pattern, Decreased step length - left, Decreased stance time - left, Decreased stride length, Decreased dorsiflexion - left, Decreased dorsiflexion - right, Shuffle General Gait Details: Pt with initially 2-3 episodes of gross loss of balance laterally, requiring modA to correct. Utilizing step to pattern and  progressing to step through  pattern with minA in hallway. Pt demonstrates decreased bilateral heel strike, decreased LLE stance time/weightbearing, shortened L step length, and decreased reciprocal arm swing. Cues provided for heel strike at initial contact, putting R arm by side (tends to be in high guard position). Gait velocity: decreased   ADL: ADL Overall ADL's : Needs assistance/impaired Eating/Feeding: Set up, Bed level Grooming: Minimal assistance, Standing Grooming Details (indicate cue type and reason): While standing at the sink. Assist for balance Upper Body Bathing: Minimal assistance, Sitting Upper Body Bathing Details (indicate cue type and reason): To maintain balance Lower Body Bathing: Minimal assistance, Moderate assistance, Sit to/from stand Lower Body Bathing Details (indicate cue type and reason): To maintain balance.  Upper Body Dressing : Minimal assistance, Sitting Lower Body Dressing: Minimal assistance, Moderate assistance, Sitting/lateral leans, Sit to/from stand Toilet Transfer: Minimal assistance, Ambulation, Regular Toilet, Grab bars Toileting- Clothing Manipulation and Hygiene: Minimal assistance, Moderate assistance, Sit to/from stand Functional mobility during ADLs: Moderate assistance, Rolling walker General ADL Comments: Pt able to ambulate to/from bathroom with RW and mod assist. Noted several instances of retro and left lateral LOB with pt requiring assist to self-correct.   Cognition: Cognition Overall Cognitive Status: History of cognitive impairments - at baseline Orientation Level: Oriented to person, Oriented to place, Oriented to situation, Disoriented to time Cognition Arousal/Alertness: Awake/alert Behavior During Therapy: WFL for tasks assessed/performed Overall Cognitive Status: History of cognitive impairments - at baseline General Comments: Has difficulty following multi step commands, needs visual demonstration at times and repetition     Physical Exam: Blood pressure (!) 174/89, pulse 63, temperature 97.9 F (36.6 C), temperature source Oral, resp. rate 14, SpO2 98 %. Physical Exam  Constitutional: He is oriented to person, place, and time. He appears well-developed and well-nourished. No distress.  HENT:  Head: Atraumatic.  Mouth/Throat: Oropharynx is clear and moist. No oropharyngeal exudate.  Eyes: Pupils are equal, round, and reactive to light. Conjunctivae and EOM are normal.  Neck: No tracheal deviation present. No thyromegaly present.  Cardiovascular: Normal rate, regular rhythm and normal heart sounds. Exam reveals no friction rub.  No murmur heard. Respiratory: Effort normal and breath sounds normal. No respiratory distress. He has no wheezes.  GI: Soft. He exhibits no distension. There is no abdominal tenderness. There is no rebound.  Musculoskeletal:        General: No edema. Normal range of motion.     Cervical back: Normal range of motion.  Neurological: He is alert and oriented to person, place, and time. No cranial nerve deficit.  Fair insight and awareness. Delayed processing. STM deficits. RUE 5/5. RLE 5/5. LUE 4/5. LLE 4+/5. Left PD and slight dysmetria of LUE,LLE. Senses pain and LT equally right to left.   Skin: Skin is warm and dry. He is not diaphoretic.  Psychiatric: He has a normal mood and affect. His behavior is normal.      Lab Results Last 48 Hours        Results for orders placed or performed during the hospital encounter of 12/15/19 (from the past 48 hour(s))  Basic metabolic panel     Status: Abnormal    Collection Time: 12/18/19  4:15 AM  Result Value Ref Range    Sodium 142 135 - 145 mmol/L    Potassium 3.5 3.5 - 5.1 mmol/L    Chloride 107 98 - 111 mmol/L    CO2 26 22 - 32 mmol/L    Glucose, Bld 101 (H) 70 - 99 mg/dL  Comment: Glucose reference range applies only to samples taken after fasting for at least 8 hours.    BUN 27 (H) 8 - 23 mg/dL    Creatinine, Ser 1.05 0.61 -  1.24 mg/dL    Calcium 9.1 8.9 - 10.3 mg/dL    GFR calc non Af Amer >60 >60 mL/min    GFR calc Af Amer >60 >60 mL/min    Anion gap 9 5 - 15      Comment: Performed at [facility] [address], [place], [place] [address]       Imaging Results (Last 48 hours)  CT ANGIO HEAD W OR WO CONTRAST   Result Date: 12/16/2019 CLINICAL DATA:  Stroke follow-up EXAM: CT ANGIOGRAPHY HEAD AND NECK TECHNIQUE: Multidetector CT imaging of the head and neck was performed using the standard protocol during bolus administration of intravenous contrast. Multiplanar CT image reconstructions and MIPs were obtained to evaluate the vascular anatomy. Carotid stenosis measurements (when applicable) are obtained utilizing NASCET criteria, using the distal internal carotid diameter as the denominator. CONTRAST:  60mL OMNIPAQUE IOHEXOL 350 MG/ML SOLN COMPARISON:  None. FINDINGS: CTA NECK FINDINGS SKELETON: There is no bony spinal canal stenosis. No lytic or blastic lesion. OTHER NECK: Normal pharynx, larynx and major salivary glands. No cervical lymphadenopathy. Unremarkable thyroid gland. UPPER CHEST: No pneumothorax or pleural effusion. No nodules or masses. AORTIC ARCH: There is mild calcific atherosclerosis of the aortic arch. There is no aneurysm, dissection or hemodynamically significant stenosis of the visualized portion of the aorta. Conventional 3 vessel aortic branching pattern. The visualized proximal subclavian arteries are widely patent. RIGHT CAROTID SYSTEM: No dissection, occlusion or aneurysm. There is mixed density atherosclerosis extending into the proximal ICA, resulting in less than 50% stenosis. LEFT CAROTID SYSTEM: No dissection, occlusion or aneurysm. There is mixed density atherosclerosis extending into the proximal ICA, resulting in less than 50% stenosis. VERTEBRAL ARTERIES: Left dominant configuration. Both origins are clearly patent. There is no dissection, occlusion or flow-limiting stenosis to the  skull base (V1-V3 segments). CTA HEAD FINDINGS POSTERIOR CIRCULATION: --Vertebral arteries: Normal V4 segments. --Posterior inferior cerebellar arteries (PICA): Patent origins from the vertebral arteries. --Anterior inferior cerebellar arteries (AICA): Patent origins from the basilar artery. --Basilar artery: Normal. --Superior cerebellar arteries: Normal. --Posterior cerebral arteries: Normal. Both originate from the basilar artery. Posterior communicating arteries (p-comm) are diminutive or absent. ANTERIOR CIRCULATION: --Intracranial internal carotid arteries: Normal. --Anterior cerebral arteries (ACA): Normal. Both A1 segments are present. Patent anterior communicating artery (a-comm). --Middle cerebral arteries (MCA): Normal. VENOUS SINUSES: As permitted by contrast timing, patent. ANATOMIC VARIANTS: None Review of the MIP images confirms the above findings. IMPRESSION: 1. No emergent large vessel occlusion or hemodynamically significant stenosis of the head or neck. 2. Bilateral proximal ICA atherosclerosis with less than 50% stenosis. Electronically Signed   By: Ulyses Jarred M.D.   On: 12/16/2019 19:38    CT ANGIO NECK W OR WO CONTRAST   Result Date: 12/16/2019 CLINICAL DATA:  Stroke follow-up EXAM: CT ANGIOGRAPHY HEAD AND NECK TECHNIQUE: Multidetector CT imaging of the head and neck was performed using the standard protocol during bolus administration of intravenous contrast. Multiplanar CT image reconstructions and MIPs were obtained to evaluate the vascular anatomy. Carotid stenosis measurements (when applicable) are obtained utilizing NASCET criteria, using the distal internal carotid diameter as the denominator. CONTRAST:  44mL OMNIPAQUE IOHEXOL 350 MG/ML SOLN COMPARISON:  None. FINDINGS: CTA NECK FINDINGS SKELETON: There is no bony spinal canal stenosis. No lytic or  blastic lesion. OTHER NECK: Normal pharynx, larynx and major salivary glands. No cervical lymphadenopathy. Unremarkable thyroid gland.  UPPER CHEST: No pneumothorax or pleural effusion. No nodules or masses. AORTIC ARCH: There is mild calcific atherosclerosis of the aortic arch. There is no aneurysm, dissection or hemodynamically significant stenosis of the visualized portion of the aorta. Conventional 3 vessel aortic branching pattern. The visualized proximal subclavian arteries are widely patent. RIGHT CAROTID SYSTEM: No dissection, occlusion or aneurysm. There is mixed density atherosclerosis extending into the proximal ICA, resulting in less than 50% stenosis. LEFT CAROTID SYSTEM: No dissection, occlusion or aneurysm. There is mixed density atherosclerosis extending into the proximal ICA, resulting in less than 50% stenosis. VERTEBRAL ARTERIES: Left dominant configuration. Both origins are clearly patent. There is no dissection, occlusion or flow-limiting stenosis to the skull base (V1-V3 segments). CTA HEAD FINDINGS POSTERIOR CIRCULATION: --Vertebral arteries: Normal V4 segments. --Posterior inferior cerebellar arteries (PICA): Patent origins from the vertebral arteries. --Anterior inferior cerebellar arteries (AICA): Patent origins from the basilar artery. --Basilar artery: Normal. --Superior cerebellar arteries: Normal. --Posterior cerebral arteries: Normal. Both originate from the basilar artery. Posterior communicating arteries (p-comm) are diminutive or absent. ANTERIOR CIRCULATION: --Intracranial internal carotid arteries: Normal. --Anterior cerebral arteries (ACA): Normal. Both A1 segments are present. Patent anterior communicating artery (a-comm). --Middle cerebral arteries (MCA): Normal. VENOUS SINUSES: As permitted by contrast timing, patent. ANATOMIC VARIANTS: None Review of the MIP images confirms the above findings. IMPRESSION: 1. No emergent large vessel occlusion or hemodynamically significant stenosis of the head or neck. 2. Bilateral proximal ICA atherosclerosis with less than 50% stenosis. Electronically Signed   By: Ulyses Jarred M.D.   On: 12/16/2019 19:38    DG Swallowing Func-Speech Pathology   Result Date: 12/16/2019 Objective Swallowing Evaluation: Type of Study: MBS-Modified Barium Swallow Study  Patient Details Name: JAMAICA AGUAYO MRN: [id number] Date of Birth: Jun 23, 1951 Today's Date: 12/16/2019 Time: SLP Start Time (ACUTE ONLY): 0900 -SLP Stop Time (ACUTE ONLY): 0925 SLP Time Calculation (min) (ACUTE ONLY): 25 min Past Medical History: No past medical history on file. Past Surgical History: HPI: 69 yo male adm to [facility] with confusion, disorientation.  Pt has h/o pontine CVA (left) and thalamic cva in 2003.  He is LEFT HANDED.  Pt also with h/o tobacco use.  Imaging study showed right thalamocapsular CVA.  Pt did not pass the [facility] screen and SLP was ordered.  SLP encountered pt awake and reporting hunger.  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  Cervical spine appears to impinge into pharynx/cervical esophagus preventing adequate epiglottic deflection as it contacts posterior pharyngeal wall thus contributing to pharyngeal retention.  Oral deficits with impaired oral bolus manipulation and decreased propulsion with oral residuals.  Pharyngeal swallow c/b decreased tongue base retraction, impaired laryngeal elevation/closure resulting in vallecular residuals and aspiration of thin (mostly silent - delayed cough with larger amount).  Chin tuck and head turn left/right did not prevent penetration or aspiration consistently nor did it decrease retention.  Reflexive dry swallows helpful to clear pharyngeal retention *secretions retained also.  Cued effortful swallow likely improved pharyngeal clearance due to improved muscle recruitment. Barium tablet swallowed with pudding halted in esophagus *appeared near aortic arch but radiologist not present to confirm*.   Of note, pt was not sensate to halted tablet.  Liquid swallows appeared to aid transit of tablet into esophagus.  Pt was educated to  all findings/recommendations live with monitor and with written precaution sign using teach back for clinical reasoning.  Pt reports his swallow ability to be at baseline - stating he has coughed with intake for as long as she can recall.  However given this new cva event with decreased mobilitization, congested cough and h/o smoking - his aspiration pna risk will be higher.  Recommend dys3/nectar liquids and allow thin water between meals after oral care. SLP Visit Diagnosis Dysphagia, oropharyngeal phase (R13.12);Dysphagia, pharyngoesophageal phase (R13.14) Attention and concentration deficit following -- Frontal lobe and executive function deficit following -- Impact on safety and function Moderate aspiration risk   CHL IP TREATMENT RECOMMENDATION 12/16/2019 Treatment Recommendations Therapy as outlined in treatment plan below   Prognosis 12/16/2019 Prognosis for Safe Diet Advancement Fair Barriers to Reach Goals Time post onset Barriers/Prognosis Comment -- CHL IP DIET RECOMMENDATION 12/16/2019 SLP Diet Recommendations Dysphagia 3 (Mech soft) solids;Nectar thick liquid Liquid Administration via Cup;Straw Medication Administration Whole meds with puree Compensations Slow rate;Small sips/bites Postural Changes Remain semi-upright after after feeds/meals (Comment)   CHL IP OTHER RECOMMENDATIONS 12/16/2019 Recommended Consults -- Oral Care Recommendations Oral care BID Other Recommendations --   CHL IP FOLLOW UP RECOMMENDATIONS 12/16/2019 Follow up Recommendations Home health SLP   CHL IP FREQUENCY AND DURATION 12/16/2019 Speech Therapy Frequency (ACUTE ONLY) min 1 x/week Treatment Duration 1 week      CHL IP ORAL PHASE 12/16/2019 Oral Phase Impaired Oral - Pudding Teaspoon -- Oral - Pudding Cup -- Oral - Honey Teaspoon -- Oral - Honey Cup -- Oral - Nectar Teaspoon Reduced posterior  propulsion;Premature spillage;Weak lingual manipulation Oral - Nectar Cup Reduced posterior propulsion;Premature spillage;Weak lingual manipulation Oral - Nectar Straw Reduced posterior propulsion;Premature spillage;Weak lingual manipulation Oral - Thin Teaspoon Reduced posterior propulsion;Premature spillage;Weak lingual manipulation Oral - Thin Cup Reduced posterior propulsion;Premature spillage;Weak lingual manipulation Oral - Thin Straw Reduced posterior propulsion;Premature spillage;Weak lingual manipulation Oral - Puree Reduced posterior propulsion;Weak lingual manipulation;Premature spillage Oral - Mech Soft Reduced posterior propulsion;Weak lingual manipulation;Premature spillage Oral - Regular -- Oral - Multi-Consistency -- Oral - Pill Reduced posterior propulsion;Weak lingual manipulation;Premature spillage Oral Phase - Comment --  CHL IP PHARYNGEAL PHASE 12/16/2019 Pharyngeal Phase Impaired Pharyngeal- Pudding Teaspoon -- Pharyngeal -- Pharyngeal- Pudding Cup -- Pharyngeal -- Pharyngeal- Honey Teaspoon -- Pharyngeal -- Pharyngeal- Honey Cup -- Pharyngeal -- Pharyngeal- Nectar Teaspoon -- Pharyngeal -- Pharyngeal- Nectar Cup Reduced epiglottic inversion;Pharyngeal residue - valleculae;Reduced tongue base retraction Pharyngeal Material does not enter airway Pharyngeal- Nectar Straw Pharyngeal residue - valleculae;Reduced tongue base retraction;Reduced epiglottic inversion Pharyngeal Material does not enter airway Pharyngeal- Thin Teaspoon Reduced laryngeal elevation;Reduced anterior laryngeal mobility;Reduced epiglottic inversion;Reduced airway/laryngeal closure;Penetration/Aspiration during swallow;Delayed swallow initiation-pyriform sinuses;Reduced tongue base retraction Pharyngeal Material enters airway, CONTACTS cords and not ejected out Pharyngeal- Thin Cup Delayed swallow initiation-pyriform sinuses;Penetration/Aspiration during swallow;Trace aspiration;Pharyngeal residue - valleculae;Reduced  epiglottic inversion;Reduced tongue base retraction Pharyngeal Material enters airway, passes BELOW cords without attempt by patient to eject out (silent aspiration) Pharyngeal- Thin Straw Delayed swallow initiation-pyriform sinuses;Penetration/Aspiration during swallow;Trace aspiration;Moderate aspiration;Pharyngeal residue - valleculae;Reduced epiglottic inversion;Reduced tongue base retraction Pharyngeal -- Pharyngeal- Puree Reduced epiglottic inversion;Pharyngeal residue - valleculae;Reduced tongue base retraction Pharyngeal Material does not enter airway Pharyngeal- Mechanical Soft Reduced epiglottic inversion;Pharyngeal residue - valleculae;Reduced tongue base retraction Pharyngeal Material does not enter airway Pharyngeal- Regular -- Pharyngeal -- Pharyngeal- Multi-consistency -- Pharyngeal -- Pharyngeal- Pill Pharyngeal residue - valleculae;Reduced epiglottic inversion Pharyngeal Material does not enter airway Pharyngeal Comment various postures including head turn right/left with and  without chin tuck were not helpful to prevent residuals not penetration/aspiration, cued cough did not clear deeper aspirates, aspiration was mild, anatomical abnormality *appeararance of cervical spine degenerative dx vs osteophytes protruding into pharynx diminished epiglottic deflection and contributed to residuals  CHL IP CERVICAL ESOPHAGEAL PHASE 12/16/2019 Cervical Esophageal Phase Impaired Pudding Teaspoon -- Pudding Cup -- Honey Teaspoon -- Honey Cup -- Nectar Teaspoon -- Nectar Cup -- Nectar Straw -- Thin Teaspoon -- Thin Cup -- Thin Straw -- Puree -- Mechanical Soft -- Regular -- Multi-consistency -- Pill -- Cervical Esophageal Comment Barium tablet swallowed with pudding halted in esophagus *appeared near aortic arch but radiologist not present to confirm*.  Of note, pt was not sensate to halted tablet.  Liquid swallows appeared to aid transit of tablet into esophagus Macario Golds 12/16/2019, 10:24 AM  Kathleen Lime,  MS [facility] SLP Acute Rehab Services Office [phone]              ECHOCARDIOGRAM COMPLETE BUBBLE STUDY   Result Date: 12/16/2019    ECHOCARDIOGRAM REPORT   Patient Name:   KAIMEN BRUSS Date of Exam: 12/16/2019 Medical Rec #:  [id number]        Height: Accession #:    [id number]       Weight: Date of Birth:  03/09/1951       BSA: Patient Age:    43 years         BP:           211/91 mmHg Patient Gender: M                HR:           57 bpm. Exam Location:  Inpatient Procedure: 2D Echo, Cardiac Doppler and Color Doppler Indications:    CVA  History:        Patient has no prior history of Echocardiogram examinations.                 Stroke; Risk Factors:Hypertension.  Sonographer:    Dustin Flock Referring Phys: [place]  1. Left ventricular ejection fraction, by estimation, is 60 to 65%. The left ventricle has normal function. The left ventricle has no regional wall motion abnormalities. There is moderate concentric left ventricular hypertrophy. Left ventricular diastolic parameters are indeterminate.  2. Right ventricular systolic function is normal. The right ventricular size is normal.  3. The mitral valve is normal in structure. Trivial mitral valve regurgitation. No evidence of mitral stenosis.  4. The aortic valve is grossly normal. Aortic valve regurgitation is not visualized. No aortic stenosis is present.  5. The inferior vena cava is normal in size with <50% respiratory variability, suggesting right atrial pressure of 8 mmHg.  6. Agitated saline contrast bubble study was negative, with no evidence of any interatrial shunt. Comparison(s): No prior Echocardiogram. Conclusion(s)/Recommendation(s): No intracardiac source of embolism detected on this transthoracic study. A transesophageal echocardiogram is recommended to exclude cardiac source of embolism if clinically indicated. FINDINGS  Left Ventricle: Left ventricular ejection fraction, by estimation, is 60 to 65%. The left  ventricle has normal function. The left ventricle has no regional wall motion abnormalities. The left ventricular internal cavity size was normal in size. There is  moderate concentric left ventricular hypertrophy. Left ventricular diastolic parameters are indeterminate. Right Ventricle: The right ventricular size is normal. No increase in right ventricular wall thickness. Right ventricular systolic function is normal. Left Atrium: Left atrial size was normal in size. Right Atrium: Right  atrial size was normal in size. Pericardium: There is no evidence of pericardial effusion. Mitral Valve: The mitral valve is normal in structure. Trivial mitral valve regurgitation. No evidence of mitral valve stenosis. Tricuspid Valve: The tricuspid valve is normal in structure. Tricuspid valve regurgitation is trivial. No evidence of tricuspid stenosis. Aortic Valve: The aortic valve is grossly normal. Aortic valve regurgitation is not visualized. No aortic stenosis is present. Pulmonic Valve: The pulmonic valve was not well visualized. Pulmonic valve regurgitation is not visualized. No evidence of pulmonic stenosis. Aorta: The aortic root, ascending aorta and aortic arch are all structurally normal, with no evidence of dilitation or obstruction. Pulmonary Artery: The pulmonary artery is not well seen. Venous: The inferior vena cava is normal in size with less than 50% respiratory variability, suggesting right atrial pressure of 8 mmHg. IAS/Shunts: No atrial level shunt detected by color flow Doppler. Agitated saline contrast was given intravenously to evaluate for intracardiac shunting. Agitated saline contrast bubble study was negative, with no evidence of any interatrial shunt.  LEFT VENTRICLE PLAX 2D LVIDd:         4.50 cm  Diastology LVIDs:         2.90 cm  LV e' lateral:   8.38 cm/s LV PW:         1.50 cm  LV E/e' lateral: 6.9 LV IVS:        1.50 cm  LV e' medial:    5.11 cm/s LVOT diam:     2.00 cm  LV E/e' medial:  11.4 LV  SV:         68 LVOT Area:     3.14 cm  RIGHT VENTRICLE RV Basal diam:  2.90 cm RV S prime:     14.30 cm/s TAPSE (M-mode): 2.4 cm LEFT ATRIUM LA diam:        3.50 cm LA Vol (A2C):   47.4 ml LA Vol (A4C):   38.5 ml LA Biplane Vol: 46.8 ml  AORTIC VALVE LVOT Vmax:   105.00 cm/s LVOT Vmean:  66.500 cm/s LVOT VTI:    0.217 m  AORTA Ao Root diam: 2.90 cm MITRAL VALVE MV Area (PHT): 1.98 cm    SHUNTS MV Decel Time: 384 msec    Systemic VTI:  0.22 m MV E velocity: 58.20 cm/s  Systemic Diam: 2.00 cm MV A velocity: 88.30 cm/s MV E/A ratio:  0.66 Buford Dresser MD Electronically signed by Buford Dresser MD Signature Date/Time: 12/16/2019/4:24:11 PM    Final              Medical Problem List and Plan: 1.  Left-sided weakness with gait deficit secondary to right thalamic infarction as well as history of CVA in the past with residual left-sided weakness             -patient may shower             -ELOS/Goals: 5-7 days, mod I to supervision goals 2.  Antithrombotics: -DVT/anticoagulation: Lovenox.             -antiplatelet therapy: Aspirin 81 mg daily and Plavix 75 mg daily x3 weeks then aspirin alone (stop date 4/21) 3. Pain Management: Tylenol as needed 4. Mood: Provide emotional support             -antipsychotic agents: N/A 5. Neuropsych: This patient is capable of making decisions on his own behalf. 6. Skin/Wound Care: Routine skin checks 7. Fluids/Electrolytes/Nutrition: Routine in and outs with follow-up chemistries ordered for Monday 8.  Hyperlipidemia.  Lipitor 9.  Dysphagia.  Dysphagia #3 nectar liquids.  Follow-up speech therapy  -advance diet as tolerated  -encourage liquids  -check labs Monday  10.  Medical noncompliance.  Counseling         Lavon Paganini Angiulli, PA-C 12/18/2019  I have personally performed a face to face diagnostic evaluation of this patient and formulated the key components of the plan.  Additionally, I have personally reviewed laboratory data, imaging  studies, as well as relevant notes and concur with the physician assistant's documentation above.  The patient's status has not changed from the original H&P.  Any changes in documentation from the acute care chart have been noted above.  Meredith Staggers, MD, [facility]

## 2019-12-19 NOTE — Progress Notes (Signed)
   Subjective:   Pt was seen at the bedside today.  Patient states that he has no questions and understands that he is going to rehab today.  No complaints at this time.  Objective: CBC Latest Ref Rng & Units 12/16/2019 12/15/2019 12/15/2019  WBC 4.0 - 10.5 K/uL 9.9 - 7.3  Hemoglobin 13.0 - 17.0 g/dL 15.5 15.6 15.2  Hematocrit 39.0 - 52.0 % 47.9 46.0 47.4  Platelets 150 - 400 K/uL 197 - 173   BMP Latest Ref Rng & Units 12/18/2019 12/16/2019 12/15/2019  Glucose 70 - 99 mg/dL 101(H) 112(H) 99  BUN 8 - 23 mg/dL 27(H) 14 14  Creatinine 0.61 - 1.24 mg/dL 1.05 0.96 0.80  Sodium 135 - 145 mmol/L 142 140 142  Potassium 3.5 - 5.1 mmol/L 3.5 3.9 3.7  Chloride 98 - 111 mmol/L 107 102 107  CO2 22 - 32 mmol/L 26 27 -  Calcium 8.9 - 10.3 mg/dL 9.1 9.7 -   Vital signs in last 24 hours: Vitals:   12/18/19 1614 12/18/19 1953 12/18/19 2330 12/19/19 0410  BP: (!) 186/80 (!) 187/82 (!) 179/76 (!) 175/85  Pulse: 70 65 75 (!) 59  Resp: 16 17 14 18   Temp: 98.3 F (36.8 C) 98.4 F (36.9 C) 97.7 F (36.5 C) 98.3 F (36.8 C)  TempSrc: Oral Oral Oral Oral  SpO2: 95% 97% 95% 98%   Physical Exam General: Resting in bed comfortably, NAD HEENT: NCAT, EOMI CV: Normal S1-S2, no murmurs rubs or gallops appreciated PULM: Clear in all lung fields ABD: Soft and nontender in all quadrants NEURO: Alert and oriented.  Answers questions appropriately.  5/5 strength in upper and lower extremities.  Sensation grossly intact in all extremities.  Assessment/Plan:  Principal Problem:   CVA (cerebral vascular accident) [facility]) Active Problems:   Essential hypertension   Hyperlipidemia   Prediabetes  Mr. Lorenzano is a 69 y.o. with pertinent PMH of HTN and previous CVA who presented with focal neurologic deficits and admit for acute R thalamic infarct.   #Acute R Thalamic Infarct: #HLD: Patient's deficits are currently with balance, left-sided weakness, and mild cognitive impairment.  MRI brain shows acute small right  thalamic infarct.  CT angiogram of the head and neck was performed yesterday and showed bilateral proximal ICA atherosclerosis with less than 50% stenosis.  Echocardiogram unremarkable.  -Neurology signed off, appreciate recommendations.  F/u in 6 weeks in stroke clinic. -Atorvastatin 80 mg daily -ASA 81 mg and plavix 75 mg x 3 weeks followed by ASA alone (stop date April 21) -PT/OT recommending CIR  #HTN: Does not take any medications at home.  BP persistently in the [id number] systolics.  Patient is now out of the permissive hypertensive window.  We will start antihypertensives with a goal of weaning to normotensive ranges within the 5 to 7 days. -Increase to Amlodipine 10 mg  #Prediabetes: A1c of 6.0  -carb modified diet -f/u with PCP about controlling blood sugars  #FEN/GI -Diet: Dysphagia 3 -Fluids: None  #DVT prophylaxis -Lovenox 40 mg subq injections daily  #CODE STATUS: FULL  #Dispo: CIR today Prior to Admission Living Arrangement: Home Anticipated Discharge Location: CIR Barriers to Discharge: None   Earlene Plater, MD Internal Medicine, PGY1 Pager: [phone]  12/19/2019,8:12 AM

## 2019-12-20 ENCOUNTER — Inpatient Hospital Stay (HOSPITAL_COMMUNITY): Payer: Medicare Other | Admitting: Speech Pathology

## 2019-12-20 ENCOUNTER — Inpatient Hospital Stay (HOSPITAL_COMMUNITY): Payer: Medicare Other | Admitting: Occupational Therapy

## 2019-12-20 ENCOUNTER — Inpatient Hospital Stay (HOSPITAL_COMMUNITY): Payer: Medicare Other

## 2019-12-20 DIAGNOSIS — I10 Essential (primary) hypertension: Secondary | ICD-10-CM

## 2019-12-20 MED ORDER — AMLODIPINE BESYLATE 5 MG PO TABS
5.0000 mg | ORAL_TABLET | Freq: Every day | ORAL | Status: DC
  Administered 2019-12-20 – 2019-12-21 (×2): 5 mg via ORAL
  Filled 2019-12-20 (×3): qty 1

## 2019-12-20 NOTE — Care Management (Signed)
Inpatient Rehabilitation Center Individual Statement of Services  Patient Name:  Eric Barron  Date:  12/20/2019  Welcome to the [facility].  Our goal is to provide you with an individualized program based on your diagnosis and situation, designed to meet your specific needs.  With this comprehensive rehabilitation program, you will be expected to participate in at least 3 hours of rehabilitation therapies Monday-Friday, with modified therapy programming on the weekends.  Your rehabilitation program will include the following services:  Physical Therapy (PT), Occupational Therapy (OT), Speech Therapy (ST), 24 hour per day rehabilitation nursing, Therapeutic Recreaction (TR), Neuropsychology, Case Management (Social Worker), Rehabilitation Medicine, Nutrition Services and Pharmacy Services  Weekly team conferences will be held on Wednesdays to discuss your progress.  Your Social Industrial/product designer will talk with you frequently to get your input and to update you on team discussions.  Team conferences with you and your family in attendance may also be held.  Expected length of stay: 5-7 days  Overall anticipated outcome:Mod I/Supervision  Depending on your progress and recovery, your program may change. Your Social Industrial/product designer will coordinate services and will keep you informed of any changes. Your Social Worker's/Care Manager's name and contact numbers are listed  below.  The following services may also be recommended but are not provided by the [facility]:    [place] will be made to provide these services after discharge if needed.  Arrangements include referral to agencies that provide these services.  Your insurance has been verified to be:  Medicare Your primary doctor is:  No PCP  Pertinent information will be shared with your doctor and your insurance  company.  Care Manager/Social Worker:  Dorien Chihuahua, RN  [phone] or [phone]  Information discussed with and copy given to patient by: Margarito Liner, 12/20/2019, 10:18 AM

## 2019-12-20 NOTE — Evaluation (Signed)
Speech Language Pathology Assessment and Plan  Patient Details  Name: Eric Barron MRN: [id number] Date of Birth: 08-30-1951  SLP Diagnosis: Cognitive Impairments;Dysphagia  Rehab Potential: Good ELOS: 5-7 days    Today's Date: 12/20/2019 SLP Individual Time: 5053-9767 SLP Individual Time Calculation (min): 55 min   Problem List:  Patient Active Problem List   Diagnosis Date Noted  . Right thalamic infarction ([place]) 12/19/2019  . Essential hypertension 12/16/2019  . Hyperlipidemia 12/16/2019  . Prediabetes 12/16/2019  . CVA (cerebral vascular accident) ([facility]) 12/15/2019   Past Medical History:  Past Medical History:  Diagnosis Date  . 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 CVA, old, dysarthria   . HTN (hypertension)    Past Surgical History: History reviewed. No pertinent surgical history.  Assessment / Plan / Recommendation Clinical Impression   Eric Noe. Barron is a 69 year old right-handed male with prior history of CVA at age 41 with residual mild left-sided weakness, hypertension, hyperlipidemia and signs of early vascular dementia. Patient currently not taking medications he stopped taking that approxi-1 year ago. Per chart review lives with spouse. 1 level home 5 steps to entry. Independent with ADLs ambulates without assistive device and still drives. Presented 12/15/2019 with acute onset of gait unsteadiness. Cranial CT scan negative. Patient did not receive TPA. MRI showed small acute infarct right thalamocapsularregion. CT angiogram head and neck no emergent large vessel occlusion. Echocardiogram with ejection fraction of 65% without emboli. Admission chemistries unremarkable. Maintained on aspirin and Plavix for CVA prophylaxis x3 weeks then aspirin alone. Subcutaneous Lovenox for DVT prophylaxis. Mechanical soft diet with nectar thick liquids. Therapy evaluations completed and patient was admitted for a  comprehensive rehab program.  SLP evaluation was completed on 12/20/2019 with the following results:   Bedside Swallow Evaluation: Pt presents with a timely oral phase that was efficient for containing and clearing boluses from the oral cavity without leaving residue behind.  No overt s/s of aspiration were evident with nectar thick liquids, solids, or thin liquids despite pt using none of his recommended swallowing precautions.  Pt silently aspirated on his last MBS; however, he exhibited no clinical evidence of airway compromise as was indicated on his initial bedside swallow evaluation.  For now, I would suggest that pt remain on his currently prescribed diet of dys 3 textures and nectar thick liquids with sips of water in between meals and plan for repeat MBS soon in the hopes of liberalizing pt's diet prior to discharge home.    Cognitive-Linguistic Evaluation: Pt presents with moderately severe cognitive deficits characterized by decreased delayed recall of information, decreased orientation, decreased functional problem solving, and decreased intellectual awareness of his deficits.  As a result, pt currently requires between mod-max assist to complete tasks that are presented in an unfamiliar context.  His speech is also mildly dysarthric, largely due to low vocal intensity; however, pt reports that he is able to make his needs and wants known to his caregivers relatively easily and his speech does not bother him.  Given his short estimated length of stay, I would favor prioritizing dysphagia and cognition goals of speech intelligibility at this time.    Given the abovementioned deficits, pt would benefit from skilled ST while inpatient in order to maximize functional independence and reduce burden of care prior to discharge.  Anticipate that pt will need 24/7 supervision at discharge in addition to [facility] follow up at next level of care.  Skilled Therapeutic Interventions           Cognitive-linguistic and bedside swallow evaluation completed with results and recommendations reviewed with patient.     SLP Assessment  Patient will need skilled Speech Lanaguage Pathology Services during CIR admission    Recommendations  SLP Diet Recommendations: Dysphagia 3 (Mech soft);Free water protocol after oral care;Nectar Medication Administration: Whole meds with puree Compensations: Slow rate;Small sips/bites Postural Changes and/or Swallow Maneuvers: Out of bed for meals;Seated upright 90 degrees;Upright 30-60 min after meal Oral Care Recommendations: Oral care BID Patient destination: Home Follow up Recommendations: Home Health SLP;24 hour supervision/assistance Equipment Recommended: None recommended by SLP    SLP Frequency 3 to 5 out of 7 days   SLP Duration  SLP Intensity  SLP Treatment/Interventions 5-7 days  Minumum of 1-2 x/day, 30 to 90 minutes  Cognitive remediation/compensation;Cueing hierarchy;Functional tasks;Environmental controls;Dysphagia/aspiration precaution training;Internal/external aids;Patient/family education    Pain Pain Assessment Pain Scale: 0-10 Pain Score: 0-No pain  Prior Functioning Cognitive/Linguistic Baseline: Information not available Type of Home: House  Lives With: Spouse Available Help at Discharge: Family;Available 24 hours/day Vocation: Retired  Programmer, systems Overall Cognitive Status: History of cognitive impairments - at baseline Arousal/Alertness: Awake/alert Orientation Level: Oriented to person;Oriented to place;Oriented to situation;Disoriented to time Attention: Sustained Sustained Attention: Appears intact Memory: Impaired Memory Impairment: Retrieval deficit;Decreased recall of new information Immediate Memory Recall: Blue;Bed;Sock Memory Recall Sock: With Cue Memory Recall Blue: With Cue Memory Recall Bed: With Cue Awareness: Impaired Awareness Impairment: Intellectual impairment Problem  Solving: Impaired Problem Solving Impairment: Functional basic Safety/Judgment: Appears intact  Comprehension Auditory Comprehension Overall Auditory Comprehension: Appears within functional limits for tasks assessed Expression Expression Primary Mode of Expression: Verbal Verbal Expression Overall Verbal Expression: Appears within functional limits for tasks assessed Written Expression Dominant Hand: Left Oral Motor Oral Motor/Sensory Function Overall Oral Motor/Sensory Function: Mild impairment Facial ROM: Reduced right Facial Symmetry: Abnormal symmetry right;Suspected CN VII (facial) dysfunction Facial Strength: Reduced right;Suspected CN VII (facial) dysfunction Lingual ROM: Reduced right;Suspected CN XII (hypoglossal) dysfunction Lingual Symmetry: Suspected CN XII (hypoglossal) dysfunction Lingual Strength: Reduced;Suspected CN XII (hypoglossal) dysfunction     Bedside Swallowing Assessment General    Oral Care Assessment   Ice Chips   Thin Liquid Thin Liquid: Within functional limits Nectar Thick Nectar Thick Liquid: Within functional limits Honey Thick   Puree   Solid Solid: Within functional limits BSE Assessment Risk for Aspiration Impact on safety and function: Mild aspiration risk Other Related Risk Factors: History of dysphagia  Short Term Goals: Week 1: SLP Short Term Goal 1 (Week 1): STG=LTG due to ELOS  Refer to Care Plan for Long Term Goals  Recommendations for other services: None   Discharge Criteria: Patient will be discharged from SLP if patient refuses treatment 3 consecutive times without medical reason, if treatment goals not met, if there is a change in medical status, if patient makes no progress towards goals or if patient is discharged from hospital.  The above assessment, treatment plan, treatment alternatives and goals were discussed and mutually agreed upon: by patient  Emilio Math 12/20/2019, 12:22 PM

## 2019-12-20 NOTE — Evaluation (Signed)
Physical Therapy Assessment and Plan  Patient Details  Name: Eric Barron MRN: [id number] Date of Birth: November 19, 1950  PT Diagnosis: Abnormal posture, Abnormality of gait, Cognitive deficits, Coordination disorder, Difficulty walking, Dizziness and giddiness, Hemiplegia non-dominant, Hypertonia, Hypotonia, Impaired cognition and Muscle weakness Rehab Potential: Excellent ELOS: 3-5 days   Today's Date: 12/20/2019 PT Individual Time: 1300-1310 PT Individual Time Calculation (min): 10 min    Problem List:  Patient Active Problem List   Diagnosis Date Noted  . Right thalamic infarction ([place]) 12/19/2019  . Essential hypertension 12/16/2019  . Hyperlipidemia 12/16/2019  . Prediabetes 12/16/2019  . CVA (cerebral vascular accident) ([place]) 12/15/2019    Past Medical History:  Past Medical History:  Diagnosis Date  . 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 CVA, old, dysarthria   . HTN (hypertension)    Past Surgical History: History reviewed. No pertinent surgical history.  Assessment & Plan Clinical Impression: Patient is a 69 y.o. year old right-handed male with prior history of CVA at age 37 with residual mild left-sided weakness, hypertension, hyperlipidemia and signs of early vascular dementia. Patient currently not taking medications he stopped taking that approxi-1 year ago. Per chart review lives with spouse. 1 level home 5 steps to entry. Independent with ADLs ambulates without assistive device and still drives. Presented 12/15/2019 with acute onset of gait unsteadiness. Cranial CT scan negative. Patient did not receive TPA. MRI showed small acute infarct right thalamocapsularregion. CT angiogram head and neck no emergent large vessel occlusion. Echocardiogram with ejection fraction of 65% without emboli. Admission chemistries unremarkable. Maintained on aspirin and Plavix for CVA prophylaxis x3 weeks then aspirin alone.  Subcutaneous Lovenox for DVT prophylaxis. Mechanical soft diet with nectar thick liquids. Therapy evaluations completed and patient was admitted for a comprehensive rehab program. Patient transferred to CIR on 12/19/2019 .   Patient currently requires min with mobility secondary to muscle weakness, decreased cardiorespiratoy endurance, abnormal tone and decreased coordination, decreased memory, central origin and decreased standing balance, decreased postural control, hemiplegia and decreased balance strategies.  Prior to hospitalization, patient was independent  with mobility and lived with Spouse in a House home.  Home access is 3Stairs to enter.  Patient will benefit from skilled PT intervention to maximize safe functional mobility, minimize fall risk and decrease caregiver burden for planned discharge home with intermittent assist.  Anticipate patient will benefit from follow up OP at discharge.  PT - End of Session Activity Tolerance: Tolerates 30+ min activity without fatigue Endurance Deficit: Yes Endurance Deficit Description: Patient ambulated 6 miles per day at baseline, currently ambulating >350 feet PT Assessment Rehab Potential (ACUTE/IP ONLY): Excellent PT Barriers to Discharge: Home environment access/layout;Behavior PT Barriers to Discharge Comments: 5 STE with wide B rails (can only reach 1 at a time), decreased STM PT Patient demonstrates impairments in the following area(s): Balance;Behavior;Endurance;Motor;Nutrition;Safety;Skin Integrity;Other (comment);Sensory;Edema;Pain PT Transfers Functional Problem(s): Bed Mobility;Bed to Chair;Car;Furniture;Floor PT Locomotion Functional Problem(s): Ambulation;Wheelchair Mobility;Stairs PT Plan PT Intensity: Minimum of 1-2 x/day ,45 to 90 minutes PT Frequency: 5 out of 7 days PT Duration Estimated Length of Stay: 3-5 days PT Treatment/Interventions: Ambulation/gait training;Community reintegration;DME/adaptive equipment  instruction;Neuromuscular re-education;Psychosocial support;Stair training;UE/LE Strength taining/ROM;Balance/vestibular training;Discharge planning;Functional electrical stimulation;Pain management;Skin care/wound management;Therapeutic Activities;UE/LE Coordination activities;Cognitive remediation/compensation;Disease management/prevention;Functional mobility training;Patient/family education;Splinting/orthotics;Therapeutic Exercise PT Transfers Anticipated Outcome(s): mod I using LRAD PT Locomotion Anticipated Outcome(s): Mod I household distances and supervision community distances using LRAD PT Recommendation Recommendations for Other Services: Neuropsych consult Follow Up  Recommendations: Outpatient PT Patient destination: Home Equipment Details: Patient has a SPC and RW, will continue to assess DME needs based on patient progress  Skilled Therapeutic Intervention In addition to the PT evaluation below, the patient performed the following skilled PT interventions: Patient in recliner in the room upon PT arrival. Patient alert and agreeable to PT session. Patient denied pain during session. Patient's wife arrived at beginning of session.  Vitals: BP 181/78, HR 66, SPO2 96% on RA. RN made aware of elevated BP and cleared patient to continue with PT evaluation monitoring for symptoms.  Therapeutic Activity: Bed Mobility: Patient performed rolling R/L and supine to/from sit independently in the on a mat table.  Transfers: Patient performed sit to/from stand x6 with supervision with and without use of UEs. Provided verbal cues for reaching back for safety x2. Patient performed a simulated sedan height car transfer with supervision performing step-in technique using car frame for steadying support.  Five times Sit to Stand Test (FTSS) Method: Use a straight back chair with a solid seat that is 16-18" high. Ask participant to sit on the chair with arms folded across their chest.    Instructions: "Stand up and sit down as quickly as possible 5 times, keeping your arms folded across your chest."   Measurement: Stop timing when the participant stands the 5th time.  TIME: ___16.1___ (in seconds)  Times > 13.6 seconds is associated with increased disability and morbidity (Guralnik, 2000) Times > 15 seconds is predictive of recurrent falls in healthy individuals aged 58 and older (Buatois, et al., 2008) Normal performance values in community dwelling individuals aged 23 and older (Bohannon, 2006): o 60-69 years: 11.4 seconds o 70-79 years: 12.6 seconds o 80-89 years: 14.8 seconds  MCID: ? 2.3 seconds for Vestibular Disorders (Meretta, 2006)  Gait Training:  Patient ambulated >350 feet and >150 feet x3 without an AD with CGA and intermittent min A for posterior LOB. Ambulated with decreased gait speed and step-length/height with intermittent shuffling gait. Provided verbal cues for forward weight shift, increased step height, and increased gait speed to improve step length and balance to reduce fall risk. Patient ascending/descended 12-6" steps using 1-2 rails (intermittently grabbed the second rail for balance) with CGA. Performed reciprocal gait pattern with intermittent step-to gait on descent. Had poor foot clearance while ascending steps. Patient ambulated up/down a ramp, over 10 feet of mulch (unlevel surface), and up/down a curb to simulate community ambulation over unlevel surfaces with CGA with intermittent use of a rail for steadying support.   Neuromuscular Re-ed: Patient performed the Berg Balance Scale: Patient demonstrates increased fall risk as noted by score of 41/56 on Berg Balance Scale.  (<36= high risk for falls, close to 100%; 37-45 significant >80%; 46-51 moderate >50%; 52-55 lower >25%)  Patient in recliner with his wife in the room at end of session with breaks locked, seat belt alarm set, and all needs within reach. Vitals: BP 179/77, HR  70.  Instructed pt in results of PT evaluation as detailed below, PT POC, rehab potential, rehab goals, and discharge recommendations. Additionally discussed CIR's policies regarding fall safety and use of chair alarm and/or quick release belt. Pt verbalized understanding and in agreement. Will update pt's family members as they become available.    PT Evaluation Precautions/Restrictions Precautions Precautions: Fall Pain Pain Assessment Pain Scale: 0-10 Pain Score: 0-No pain Home Living/Prior Functioning Home Living Living Arrangements: Spouse/significant other Available Help at Discharge: Family Type of Home: House Home Access: Stairs  to enter Entrance Stairs-Number of Steps: 3 Entrance Stairs-Rails: Right;Left(wide apart) Home Layout: One level Bathroom Shower/Tub: Chiropodist: Standard  Lives With: Spouse Prior Function Level of Independence: Independent with basic ADLs;Independent with homemaking with ambulation;Independent with gait;Independent with transfers;Requires assistive device for independence  Able to Take Stairs?: Reciprically Driving: Yes Vocation: Retired Comments: Pt independent in ADLs, IADLs, and mobility. Pt ambulates without an assistive device, up to 6 miles a day. 0 falls. Still drives. Vision/Perception  Vision - Assessment Eye Alignment: Within Functional Limits Ocular Range of Motion: Within Functional Limits Alignment/Gaze Preference: Within Defined Limits Tracking/Visual Pursuits: Requires cues, head turns, or add eye shifts to track Saccades: Within functional limits Convergence: Impaired (comment) Perception Perception: Within Functional Limits Praxis Praxis: Intact  Cognition Overall Cognitive Status: History of cognitive impairments - at baseline Arousal/Alertness: Awake/alert Orientation Level: Oriented to person;Oriented to situation Attention: Sustained;Focused Focused Attention: Appears intact Sustained  Attention: Appears intact Memory: Impaired Memory Impairment: Retrieval deficit;Decreased recall of new information Awareness: Impaired Awareness Impairment: Intellectual impairment Problem Solving: Impaired Problem Solving Impairment: Functional basic Safety/Judgment: Appears intact Sensation Sensation Light Touch: Appears Intact Coordination Gross Motor Movements are Fluid and Coordinated: No Fine Motor Movements are Fluid and Coordinated: Yes Coordination and Movement Description: decreased speed and posterior LOB/weight shift with standing and ambulation Finger Nose Finger Test: WNL bilaterally Heel Shin Test: WNL bilaterally Motor  Motor Motor: Hemiplegia Motor - Skilled Clinical Observations: Mild Lt hemi with residual Lt hemiparesis from previous CVA, UE>LE  Mobility Bed Mobility Bed Mobility: Rolling Right;Rolling Left;Supine to Sit;Sit to Supine Rolling Right: Independent Rolling Left: Independent Supine to Sit: Independent Sit to Supine: Independent Transfers Transfers: Sit to Stand;Stand to Sit;Stand Pivot Transfers Sit to Stand: Supervision/Verbal cueing Stand to Sit: Supervision/Verbal cueing Stand Pivot Transfers: Supervision/Verbal cueing Transfer (Assistive device): None Locomotion  Gait Ambulation: Yes Gait Distance (Feet): 350 Feet Assistive device: None Gait Gait: Yes Gait Pattern: Step-through pattern;Decreased stride length;Decreased step length - right;Decreased step length - left;Decreased hip/knee flexion - left;Decreased hip/knee flexion - right;Shuffle;Decreased trunk rotation;Narrow base of support;Poor foot clearance - left Gait velocity: significantly decreased High Level Ambulation High Level Ambulation: Head turns Head Turns: reports dizzines and has LOB requiring min A to correct with horizontal head turns Stairs / Additional Locomotion Stairs: Yes Stairs Assistance: Contact Guard/Touching assist Stair Management Technique: Two  rails Number of Stairs: 12 Height of Stairs: 6 Ramp: Contact Guard/touching assist Curb: Contact Guard/Touching assist(with rail) 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, petroleum)  Trunk/Postural Assessment  Cervical Assessment Cervical Assessment: Within Functional Limits Thoracic Assessment Thoracic Assessment: Within Functional Limits Lumbar Assessment Lumbar Assessment: Within Functional Limits Postural Control Postural Control: Within Functional Limits  Balance Balance Balance Assessed: Yes Standardized Balance Assessment Standardized Balance Assessment: Berg Balance Test Berg Balance Test Sit to Stand: Able to stand without using hands and stabilize independently Standing Unsupported: Able to stand safely 2 minutes Sitting with Back Unsupported but Feet Supported on Floor or Stool: Able to sit safely and securely 2 minutes Stand to Sit: Sits safely with minimal use of hands Transfers: Able to transfer safely, minor use of hands Standing Unsupported with Eyes Closed: Able to stand 10 seconds safely Standing Ubsupported with Feet Together: Able to place feet together independently and stand 1 minute safely From Standing, Reach Forward with Outstretched Arm: Can reach forward >5 cm safely (2") From Standing Position, Pick up Object from Floor: Able to pick up shoe, needs supervision From Standing Position, Turn to  Look Behind Over each Shoulder: Turn sideways only but maintains balance Turn 360 Degrees: Needs close supervision or verbal cueing Standing Unsupported, Alternately Place Feet on Step/Stool: Able to complete >2 steps/needs minimal assist Standing Unsupported, One Foot in Front: Able to plae foot ahead of the other independently and hold 30 seconds Standing on One Leg: Tries to lift leg/unable to hold 3 seconds but remains standing independently Total Score: 41/56 Static Sitting Balance Static Sitting - Balance Support: No upper  extremity supported;Feet supported Static Sitting - Level of Assistance: 7: Independent Dynamic Sitting Balance Dynamic Sitting - Balance Support: No upper extremity supported;During functional activity;Feet supported Dynamic Sitting - Level of Assistance: 7: Independent Dynamic Sitting - Balance Activities: Forward lean/weight shifting;Reaching for objects Static Standing Balance Static Standing - Balance Support: No upper extremity supported Static Standing - Level of Assistance: 7: Independent Dynamic Standing Balance Dynamic Standing - Balance Support: No upper extremity supported;During functional activity Dynamic Standing - Level of Assistance: 4: Min assist Dynamic Standing - Balance Activities: Lateral lean/weight shifting;Forward lean/weight shifting;Reaching for objects Extremity Assessment  RUE Assessment RUE Assessment: Within Functional Limits Active Range of Motion (AROM) Comments: WNL General Strength Comments: 4+/5 grossly LUE Assessment LUE Assessment: Within Functional Limits Active Range of Motion (AROM) Comments: WNL General Strength Comments: 4/5 grossly RLE Assessment RLE Assessment: Within Functional Limits Active Range of Motion (AROM) Comments: WFL for all functional mobility, tight hamstrings General Strength Comments: Grossly in sitting 5/5 throughout LLE Assessment LLE Assessment: Within Functional Limits Active Range of Motion (AROM) Comments: WFL for all functional mobility General Strength Comments: Grossly in sitting 5/5 throughout, except hip flexion and PF 4+/5    Refer to Care Plan for Long Term Goals  Recommendations for other services: Neuropsych  Discharge Criteria: Patient will be discharged from PT if patient refuses treatment 3 consecutive times without medical reason, if treatment goals not met, if there is a change in medical status, if patient makes no progress towards goals or if patient is discharged from hospital.  The above  assessment, treatment plan, treatment alternatives and goals were discussed and mutually agreed upon: by patient and by family  Doreene Burke PT, DPT  12/20/2019, 4:45 PM

## 2019-12-20 NOTE — Progress Notes (Signed)
[facility] PHYSICAL MEDICINE & REHABILITATION PROGRESS NOTE   Subjective/Complaints: Had a quiet night. Says he's ready for therapy. No pain  ROS: Patient denies fever, rash, sore throat, blurred vision, nausea, vomiting, diarrhea, cough, shortness of breath or chest pain, joint or back pain, headache, or mood change.    Objective:   No results found. Recent Labs    12/19/19 1724  WBC 9.0  HGB 14.5  HCT 45.7  PLT 176   Recent Labs    12/18/19 0415 12/19/19 1724  NA 142  --   K 3.5  --   CL 107  --   CO2 26  --   GLUCOSE 101*  --   BUN 27*  --   CREATININE 1.05 1.00  CALCIUM 9.1  --     Intake/Output Summary (Last 24 hours) at 12/20/2019 0807 Last data filed at 12/19/2019 1822 Gross per 24 hour  Intake 120 ml  Output --  Net 120 ml     Physical Exam: Vital Signs Blood pressure (!) 182/93, pulse 64, temperature 98.4 F (36.9 C), temperature source Oral, resp. rate 18, height 5\' 9"  (1.753 m), weight 78.6 kg, SpO2 97 %. Constitutional: No distress . Vital signs reviewed. HEENT: EOMI, oral membranes moist Neck: supple Cardiovascular: RRR without murmur. No JVD    Respiratory/Chest: CTA Bilaterally without wheezes or rales. Normal effort    GI/Abdomen: BS +, non-tender, non-distended Ext: no clubbing, cyanosis, or edema Psych: pleasant but pretty flat Musculoskeletal:  General: No edema.Normal range of motion.  Cervical back: Normal range of motion.  Neurological: He isalertand oriented to person, place, and time. Nocranial nerve deficit.soft voice almost dysarthric Fair insight and awareness. Delayed processing. STM deficits. RUE 5/5. RLE 5/5. LUE 4/5. LLE 4+/5. PD LUE, dysmetria LUE,LLE. Senses pain and LT equally right to left. Skin: Skin iswarmand dry. He isnot diaphoretic.       Assessment/Plan: 1. Functional deficits secondary to right thalamic infarct which require 3+ hours per day of interdisciplinary therapy in a comprehensive  inpatient rehab setting.  Physiatrist is providing close team supervision and 24 hour management of active medical problems listed below.  Physiatrist and rehab team continue to assess barriers to discharge/monitor patient progress toward functional and medical goals  Care Tool:  Bathing              Bathing assist       Upper Body Dressing/Undressing Upper body dressing        Upper body assist      Lower Body Dressing/Undressing Lower body dressing            Lower body assist       Toileting Toileting    Toileting assist Assist for toileting: Minimal Assistance - Patient > 75%     Transfers Chair/bed transfer  Transfers assist     Chair/bed transfer assist level: Minimal Assistance - Patient > 75%     Locomotion Ambulation   Ambulation assist              Walk 10 feet activity   Assist           Walk 50 feet activity   Assist           Walk 150 feet activity   Assist           Walk 10 feet on uneven surface  activity   Assist           Wheelchair     Assist  Wheelchair 50 feet with 2 turns activity    Assist            Wheelchair 150 feet activity     Assist          Blood pressure (!) 182/93, pulse 64, temperature 98.4 F (36.9 C), temperature source Oral, resp. rate 18, height 5\' 9"  (1.753 m), weight 78.6 kg, SpO2 97 %.  Medical Problem List and Plan: 1.Left-sided weakness with gait deficitsecondary to right thalamic infarction as well as history of CVA in the past with residual left-sided weakness -patient may shower -ELOS/Goals: 5-7 days, mod I to supervision goals  Patient is beginning CIR therapies today including PT SLP and OT  2. Antithrombotics: -DVT/anticoagulation:Lovenox. -antiplatelet therapy: Aspirin 81 mg daily and Plavix 75 mg daily x3 weeks then aspirin alone(stop date 4/21) 3. Pain  Management:Tylenol as needed 4. Mood:Provide emotional support -antipsychotic agents: N/A 5. Neuropsych: This patientiscapable of making decisions on hisown behalf. 6. Skin/Wound Care:Routine skin checks 7. Fluids/Electrolytes/Nutrition:Routine in and outs with follow-up chemistries ordered for Monday 8. Hyperlipidemia. Lipitor 9. Dysphagia. Dysphagia #3 nectar liquids. Follow-up speech therapy             -advance diet as tolerated             -encourage liquids             -check labs Monday  10. Medical noncompliance. Counseling 11. HTN: pt on norvasc 10mg  at home  -bp's have been elevated thus far  -resume norvasc at 5mg  daily    LOS: 1 days A FACE TO FACE EVALUATION WAS PERFORMED  Meredith Staggers 12/20/2019, 8:07 AM

## 2019-12-20 NOTE — Plan of Care (Signed)
  Problem: RH Balance Goal: LTG Patient will maintain dynamic standing with ADLs (OT) Description: LTG:  Patient will maintain dynamic standing balance with assist during activities of daily living (OT)  Flowsheets (Taken 12/20/2019 1653) LTG: Pt will maintain dynamic standing balance during ADLs with: Supervision/Verbal cueing   Problem: Sit to Stand Goal: LTG:  Patient will perform sit to stand in prep for activites of daily living with assistance level (OT) Description: LTG:  Patient will perform sit to stand in prep for activites of daily living with assistance level (OT) Flowsheets (Taken 12/20/2019 1653) LTG: PT will perform sit to stand in prep for activites of daily living with assistance level: Supervision/Verbal cueing   Problem: RH Grooming Goal: LTG Patient will perform grooming w/assist,cues/equip (OT) Description: LTG: Patient will perform grooming with assist, with/without cues using equipment (OT) Flowsheets (Taken 12/20/2019 1653) LTG: Pt will perform grooming with assistance level of: Supervision/Verbal cueing   Problem: RH Bathing Goal: LTG Patient will bathe all body parts with assist levels (OT) Description: LTG: Patient will bathe all body parts with assist levels (OT) Flowsheets (Taken 12/20/2019 1653) LTG: Pt will perform bathing with assistance level/cueing: Supervision/Verbal cueing   Problem: RH Dressing Goal: LTG Patient will perform upper body dressing (OT) Description: LTG Patient will perform upper body dressing with assist, with/without cues (OT). Flowsheets (Taken 12/20/2019 1653) LTG: Pt will perform upper body dressing with assistance level of: Set up assist Goal: LTG Patient will perform lower body dressing w/assist (OT) Description: LTG: Patient will perform lower body dressing with assist, with/without cues in positioning using equipment (OT) Flowsheets (Taken 12/20/2019 1653) LTG: Pt will perform lower body dressing with assistance level of:  Supervision/Verbal cueing   Problem: RH Toileting Goal: LTG Patient will perform toileting task (3/3 steps) with assistance level (OT) Description: LTG: Patient will perform toileting task (3/3 steps) with assistance level (OT)  Flowsheets (Taken 12/20/2019 1653) LTG: Pt will perform toileting task (3/3 steps) with assistance level: Supervision/Verbal cueing   Problem: RH Toilet Transfers Goal: LTG Patient will perform toilet transfers w/assist (OT) Description: LTG: Patient will perform toilet transfers with assist, with/without cues using equipment (OT) Flowsheets (Taken 12/20/2019 1653) LTG: Pt will perform toilet transfers with assistance level of: Supervision/Verbal cueing   Problem: RH Tub/Shower Transfers Goal: LTG Patient will perform tub/shower transfers w/assist (OT) Description: LTG: Patient will perform tub/shower transfers with assist, with/without cues using equipment (OT) Flowsheets (Taken 12/20/2019 1653) LTG: Pt will perform tub/shower stall transfers with assistance level of: Supervision/Verbal cueing

## 2019-12-20 NOTE — Evaluation (Signed)
Occupational Therapy Assessment and Plan  Patient Details  Name: DEZMIN KITTELSON MRN: [id number] Date of Birth: 1950-11-17  OT Diagnosis: cognitive deficits, hemiplegia affecting dominant side and muscle weakness (generalized) Rehab Potential: Rehab Potential (ACUTE ONLY): Excellent ELOS: 5-7 days   Today's Date: 12/20/2019 OT Individual Time: 9563-8756 OT Individual Time Calculation (min): 71 min     Problem List:  Patient Active Problem List   Diagnosis Date Noted  . Right thalamic infarction ([place]) 12/19/2019  . Essential hypertension 12/16/2019  . Hyperlipidemia 12/16/2019  . Prediabetes 12/16/2019  . CVA (cerebral vascular accident) ([place]) 12/15/2019    Past Medical History:  Past Medical History:  Diagnosis Date  . 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 CVA, old, dysarthria   . HTN (hypertension)    Past Surgical History: History reviewed. No pertinent surgical history.  Assessment & Plan Clinical Impression:  Seaton Hofmann. Switzer is a 69 year old right-handed male with prior history of CVA at age 10 with residual mild left-sided weakness, hypertension, hyperlipidemia and signs of early vascular dementia. Patient currently not taking medications he stopped taking that approxi-1 year ago. Per chart review lives with spouse. 1 level home 5 steps to entry. Independent with ADLs ambulates without assistive device and still drives. Presented 12/15/2019 with acute onset of gait unsteadiness. Cranial CT scan negative. Patient did not receive TPA. MRI showed small acute infarct right thalamocapsularregion. CT angiogram head and neck no emergent large vessel occlusion. Echocardiogram with ejection fraction of 65% without emboli. Admission chemistries unremarkable. Maintained on aspirin and Plavix for CVA prophylaxis x3 weeks then aspirin alone. Subcutaneous Lovenox for DVT prophylaxis. Mechanical soft diet with nectar thick liquids.  Therapy evaluations completed and patient was admitted for a comprehensive rehab program.   Patient currently requires min with basic self-care skills secondary to muscle weakness, decreased coordination, decreased awareness, decreased problem solving, decreased safety awareness and decreased memory and decreased standing balance and hemiplegia.  Prior to hospitalization, patient could complete BADLs with independent .  Patient will benefit from skilled intervention to increase independence with basic self-care skills prior to discharge home with spouse Debby.  Anticipate patient will require 24 hour supervision and follow up home health.  OT Assessment Rehab Potential (ACUTE ONLY): Excellent OT Barriers to Discharge: Medical stability OT Patient demonstrates impairments in the following area(s): Balance;Safety;Cognition;Motor OT Basic ADL's Functional Problem(s): Grooming;Bathing;Dressing;Toileting OT Advanced ADL's Functional Problem(s): Light Housekeeping;Laundry OT Transfers Functional Problem(s): Toilet OT Additional Impairment(s): None OT Plan OT Intensity: Minimum of 1-2 x/day, 45 to 90 minutes OT Frequency: 5 out of 7 days OT Duration/Estimated Length of Stay: 5-7 days OT Treatment/Interventions: Balance/vestibular training;Discharge planning;Pain management;Self Care/advanced ADL retraining;Therapeutic Activities;UE/LE Coordination activities;Therapeutic Exercise;Patient/family education;Functional mobility training;Disease mangement/prevention;Cognitive remediation/compensation;Community reintegration;DME/adaptive equipment instruction;Neuromuscular re-education;Psychosocial support;UE/LE Strength taining/ROM OT Self Feeding Anticipated Outcome(s): No goal OT Basic Self-Care Anticipated Outcome(s): Supervision/cuing OT Toileting Anticipated Outcome(s): Supervision/cuing OT Bathroom Transfers Anticipated Outcome(s): Supervision/cuing OT Recommendation Recommendations for Other  Services: Therapeutic Recreation consult Therapeutic Recreation Interventions: Pet therapy;Outing/community reintergration;Kitchen group Patient destination: Home Follow Up Recommendations: 24 hour supervision/assistance Equipment Recommended: To be determined Skilled Therapeutic Intervention Skilled OT session completed with focus on initial evaluation, education on OT role/POC, and establishment of patient-centered goals.   Pt greeted in bed with no c/o pain. He completed supine<sit from flat bed without bedrails unassisted. Pt then engaged in bathing/dressing tasks while sitting EOB at sit<stand level without device. CGA for dynamic standing balance and assist needed for Teds.  Vcs for utilizing figure 4 position during LB self care to increase safety. Afterwards pt completed ambulatory bathroom transfers with and without RW, pt requiring CGA-Min A respectively. Ambulatory transfer to sink completed without AD where pt then stood to complete oral care and comb hair. He dropped the cap of the toothpaste and stooped down with CGA to retrieve it from floor. Afterwards pt ambulated without AD and CGA to the therapy apartment and transferred to the couch. Pt very eager to go home, feels ready, stating he just wants to go see his little dog (Mugsby). Worked on memory and problem solving by pathfinding his way back to the room, with pt needing max multimodal cuing and increased time to do this successfully. At end of session pt remained in the recliner with all needs within reach and safety belt fastened.   OT Evaluation Precautions/Restrictions  Precautions Precautions: Fall Pain Pain Assessment Pain Scale: 0-10 Pain Score: 0-No pain Home Living/Prior Functioning Home Living Family/patient expects to be discharged to:: Private residence Living Arrangements: Spouse/significant other Available Help at Discharge: Family, Available 24 hours/day Type of Home: House Home Layout: One level Bathroom  Shower/Tub: Chiropodist: Standard  Lives With: Spouse(Debby) IADL History Homemaking Responsibilities: Yes(Per pt, he and spouse mostly went out to eat, shared meal prep and he mostly cleaned) Occupation: Retired Type of Occupation: Building control surveyor Leisure and Hobbies: Walking around his neighborhood Prior Function Level of Independence: Independent with basic ADLs, Independent with homemaking with ambulation Driving: Yes ADL ADL Eating: Set up Grooming: Contact guard Where Assessed-Grooming: Standing at sink Upper Body Bathing: Setup Where Assessed-Upper Body Bathing: Edge of bed Lower Body Bathing: Contact guard Where Assessed-Lower Body Bathing: Edge of bed Upper Body Dressing: Supervision/safety Where Assessed-Upper Body Dressing: Edge of bed Lower Body Dressing: Minimal assistance Where Assessed-Lower Body Dressing: Edge of bed Toileting: Not assessed Toilet Transfer: Therapist, music Method: Ambulating(RW) Gaffer Transfer: Minimal assistance Social research officer, government Method: Ambulating(without AD) Vision Baseline Vision/History: Wears glasses Wears Glasses: At all times Patient Visual Report: No change from baseline Perception  Perception: Within Functional Limits Praxis Praxis: Intact Cognition Arousal/Alertness: Awake/alert Orientation Level: Person;Place;Situation Person: Oriented Place: Oriented Situation: Oriented Year: 2022 Month: June Day of Week: Incorrect Memory: Impaired Immediate Memory Recall: Blue;Bed;Sock Memory Recall Sock: With Cue Memory Recall Blue: With Cue Memory Recall Bed: With Cue Problem Solving: Impaired Safety/Judgment: Appears intact Sensation Sensation Light Touch: Appears Intact Coordination Gross Motor Movements are Fluid and Coordinated: Yes Fine Motor Movements are Fluid and Coordinated: Yes Finger Nose Finger Test: WNL bilaterally Motor  Motor Motor: Hemiplegia Motor - Skilled Clinical  Observations: Mild Lt hemi with residual Lt hemiparesis from previous CVA Mobility    CGA toilet transfer using RW at ambulatory level, Min A for ambulatory shower transfer without AD Trunk/Postural Assessment  Cervical Assessment Cervical Assessment: Within Functional Limits Thoracic Assessment Thoracic Assessment: Within Functional Limits Lumbar Assessment Lumbar Assessment: Within Functional Limits Postural Control Postural Control: Within Functional Limits  Balance Balance Balance Assessed: Yes Dynamic Sitting Balance Dynamic Sitting - Level of Assistance: 5: Stand by assistance(attempting to don Ted stockings himself while seated EOB) Dynamic Standing Balance Dynamic Standing - Level of Assistance: 4: Min assist(ambulatory shower transfer without AD) Extremity/Trunk Assessment RUE Assessment RUE Assessment: Within Functional Limits Active Range of Motion (AROM) Comments: WNL General Strength Comments: 4+/5 grossly LUE Assessment LUE Assessment: Within Functional Limits Active Range of Motion (AROM) Comments: WNL General Strength Comments: 4/5 grossly     Refer  to Care Plan for Long Term Goals  Recommendations for other services: Therapeutic Recreation  Pet therapy, Kitchen group and Outing/community reintegration   Discharge Criteria: Patient will be discharged from OT if patient refuses treatment 3 consecutive times without medical reason, if treatment goals not met, if there is a change in medical status, if patient makes no progress towards goals or if patient is discharged from hospital.  The above assessment, treatment plan, treatment alternatives and goals were discussed and mutually agreed upon: by patient  Skeet Simmer 12/20/2019, 10:58 AM

## 2019-12-21 ENCOUNTER — Inpatient Hospital Stay (HOSPITAL_COMMUNITY): Payer: Medicare Other | Admitting: Occupational Therapy

## 2019-12-21 ENCOUNTER — Inpatient Hospital Stay (HOSPITAL_COMMUNITY): Payer: Medicare Other | Admitting: Speech Pathology

## 2019-12-21 ENCOUNTER — Inpatient Hospital Stay (HOSPITAL_COMMUNITY): Payer: Medicare Other | Admitting: Physical Therapy

## 2019-12-21 LAB — CBC WITH DIFFERENTIAL/PLATELET
Abs Immature Granulocytes: 0.04 10*3/uL (ref 0.00–0.07)
Basophils Absolute: 0 10*3/uL (ref 0.0–0.1)
Basophils Relative: 0 %
Eosinophils Absolute: 0.2 10*3/uL (ref 0.0–0.5)
Eosinophils Relative: 2 %
HCT: 47 % (ref 39.0–52.0)
Hemoglobin: 14.8 g/dL (ref 13.0–17.0)
Immature Granulocytes: 0 %
Lymphocytes Relative: 26 %
Lymphs Abs: 2.7 10*3/uL (ref 0.7–4.0)
MCH: 28.5 pg (ref 26.0–34.0)
MCHC: 31.5 g/dL (ref 30.0–36.0)
MCV: 90.4 fL (ref 80.0–100.0)
Monocytes Absolute: 1 10*3/uL (ref 0.1–1.0)
Monocytes Relative: 10 %
Neutro Abs: 6.3 10*3/uL (ref 1.7–7.7)
Neutrophils Relative %: 62 %
Platelets: 185 10*3/uL (ref 150–400)
RBC: 5.2 MIL/uL (ref 4.22–5.81)
RDW: 13.5 % (ref 11.5–15.5)
WBC: 10.2 10*3/uL (ref 4.0–10.5)
nRBC: 0 % (ref 0.0–0.2)

## 2019-12-21 LAB — COMPREHENSIVE METABOLIC PANEL
ALT: 27 U/L (ref 0–44)
AST: 29 U/L (ref 15–41)
Albumin: 3.5 g/dL (ref 3.5–5.0)
Alkaline Phosphatase: 83 U/L (ref 38–126)
Anion gap: 10 (ref 5–15)
BUN: 19 mg/dL (ref 8–23)
CO2: 27 mmol/L (ref 22–32)
Calcium: 9.1 mg/dL (ref 8.9–10.3)
Chloride: 108 mmol/L (ref 98–111)
Creatinine, Ser: 0.92 mg/dL (ref 0.61–1.24)
GFR calc Af Amer: >60 mL/min (ref >60)
GFR calc non Af Amer: >60 mL/min (ref >60)
Glucose, Bld: 115 mg/dL — ABNORMAL HIGH (ref 70–99)
Potassium: 3.7 mmol/L (ref 3.5–5.1)
Sodium: 145 mmol/L (ref 135–145)
Total Bilirubin: 0.7 mg/dL (ref 0.3–1.2)
Total Protein: 6.6 g/dL (ref 6.5–8.1)

## 2019-12-21 MED ORDER — PNEUMOCOCCAL VAC POLYVALENT 25 MCG/0.5ML IJ INJ
0.5000 mL | INJECTION | INTRAMUSCULAR | Status: AC
  Administered 2019-12-22: 0.5 mL via INTRAMUSCULAR
  Filled 2019-12-21: qty 0.5

## 2019-12-21 NOTE — Progress Notes (Signed)
Physical Therapy Session Note  Patient Details  Name: [name] MRN: [id number] Date of Birth: Mar 08, 1951  Today's Date: 12/21/2019 PT Individual Time: 0805-0900 PT Individual Time Calculation (min): 55 min   Short Term Goals: Week 1:  PT Short Term Goal 1 (Week 1): STG=LTG due to short ELOS.  Skilled Therapeutic Interventions/Progress Updates: Pt presented in bed agreeable to therapy. MD present to perform daily assessment. BP checked prior to OOB activity 201/79. Discussed with nsg then MD and advised to maintain bed level activities. Pt participated in following therex: ankle pumps, SLR, sidelying abd 2 x 10 bilaterally.  Pt also participated in peg board activity with pt able to perform simple challenge (a/b/ab) without assist, but required modA for cross pattern and stacked pegs requiring increased time. Intermittently pt was asked simple questions regarding family and hobbies with pt able to recall long term memory activities (walking, visting SNF when was able) but was unable to recall why he was in hospital. Pt reoriented to situation and left in bed at end of session with bed alarm on, call bell within reach and needs met.      Therapy Documentation Precautions:  Precautions Precautions: Fall Restrictions Weight Bearing Restrictions: No General:   Vital Signs:     Therapy/Group: Individual Therapy  [name] 12/21/2019, 12:55 PM

## 2019-12-21 NOTE — Progress Notes (Signed)
Speech Language Pathology Daily Session Note  Patient Details  Name: Eric Barron MRN: [id number] Date of Birth: [birth year]  Today's Date: 12/21/2019 SLP Individual Time: [id number] SLP Individual Time Calculation (min): 53 min  Short Term Goals: Week 1: SLP Short Term Goal 1 (Week 1): STG=LTG due to ELOS  Skilled Therapeutic Interventions: Pt was seen for skilled ST targeting dysphagia and cognitive goals. Pt unable to recall information related to swallow precautions without Mod-Max A verbal cues. After pt performed oral care (with Supervision A verbal and visual cues for sequencing), pt accepted sips of thin H2O with 1 delayed throat clear noted. Further education provided regarding last MBSS, silent aspiration, aspiration pneumonia, and rationale behind diet and swallow strategies. Following thin intake, pt politely declined opportunity for Dys 3 snack. Recommend continue current diet and ST will continue to assess readiness for advancement; anticipate pt will be able to repeat MBSS within 1-2 days.  Pt was Mod I for problem solving during a 3-step action card sequencing task. He required increased Min A for functional problem solving use of his TV remote. Pt also disoriented to time; calendar provided to assist with orientation throughout his stay. Pt required Max A for recall of 4/4 current medication names, functions, and dosages. List left with pt in room to assess carryover in future sessions. Pt left laying in bed with alarm set and needs within reach. Continue per current plan of care.        Pain Pain Assessment Pain Scale: 0-10 Pain Score: 0-No pain  Therapy/Group: Individual Therapy  Arbutus Leas 12/21/2019, 7:23 AM

## 2019-12-21 NOTE — IPOC Note (Signed)
Overall Plan of Care [facility]) Patient Details Name: Eric Barron MRN: [id number] DOB: April 16, 1951  Admitting Diagnosis: <principal problem not specified>  Hospital Problems: Active Problems:   Right thalamic infarction [facility])     Functional Problem List: Nursing    PT Balance, Behavior, Endurance, Motor, Nutrition, Safety, Skin Integrity, Other (comment), Sensory, Edema, Pain  OT Balance, Safety, Cognition, Motor  SLP Cognition, Nutrition  TR         Basic ADL's: OT Grooming, Bathing, Dressing, Toileting     Advanced  ADL's: OT Light Housekeeping, Laundry     Transfers: PT Bed Mobility, Bed to Chair, Musician, [employer], Floor  OT Toilet     Locomotion: PT Ambulation, Emergency planning/management officer, Stairs     Additional Impairments: OT None  SLP Swallowing, Social Cognition   Memory, Problem Solving, Awareness  TR      Anticipated Outcomes Item Anticipated Outcome  Self Feeding No goal  Swallowing  mod I   Basic self-care  Supervision/cuing  Toileting  Supervision/cuing   Bathroom Transfers Supervision/cuing  Bowel/Bladder     Transfers  mod I using LRAD  Locomotion  Mod I household distances and supervision community distances using LRAD  Communication     Cognition  Min assist  Pain     Safety/Judgment      Therapy Plan: PT Intensity: Minimum of 1-2 x/day ,45 to 90 minutes PT Frequency: 5 out of 7 days PT Duration Estimated Length of Stay: 3-5 days OT Intensity: Minimum of 1-2 x/day, 45 to 90 minutes OT Frequency: 5 out of 7 days OT Duration/Estimated Length of Stay: 5-7 days SLP Intensity: Minumum of 1-2 x/day, 30 to 90 minutes SLP Frequency: 3 to 5 out of 7 days SLP Duration/Estimated Length of Stay: 5-7 days   Due to the current state of emergency, patients may not be receiving their 3-hours of Medicare-mandated therapy.   Team Interventions: Nursing Interventions    PT interventions Ambulation/gait training, Community reintegration,  DME/adaptive equipment instruction, Neuromuscular re-education, Psychosocial support, Stair training, UE/LE Strength taining/ROM, Training and development officer, Discharge planning, Functional electrical stimulation, Pain management, Skin care/wound management, Therapeutic Activities, UE/LE Coordination activities, Cognitive remediation/compensation, Disease management/prevention, Functional mobility training, Patient/family education, Splinting/orthotics, Therapeutic Exercise  OT Interventions Balance/vestibular training, Discharge planning, Pain management, Self Care/advanced ADL retraining, Therapeutic Activities, UE/LE Coordination activities, Therapeutic Exercise, Patient/family education, Functional mobility training, Disease mangement/prevention, Cognitive remediation/compensation, Community reintegration, Engineer, drilling, Neuromuscular re-education, Psychosocial support, UE/LE Strength taining/ROM  SLP Interventions Cognitive remediation/compensation, Cueing hierarchy, Functional tasks, Environmental controls, Dysphagia/aspiration precaution training, Internal/external aids, Patient/family education  TR Interventions    SW/CM Interventions Discharge Planning, Patient/Family Education, Psychosocial Support, Disease Management/Prevention   Barriers to Discharge MD  Medical stability  Nursing      PT Home environment access/layout, Behavior 5 STE with wide B rails (can only reach 1 at a time), decreased STM  OT Medical stability    SLP      SW Medication compliance, Inaccessible home environment, Decreased caregiver support 5 step entry to home   Team Discharge Planning: Destination: PT-Home ,OT- Home , SLP-Home Projected Follow-up: PT-Outpatient PT, OT-  24 hour supervision/assistance, SLP-Home Health SLP, 24 hour supervision/assistance Projected Equipment Needs: PT- , OT- To be determined, SLP-None recommended by SLP Equipment Details: PT-Patient has a SPC and RW, will  continue to assess DME needs based on patient progress, OT-  Patient/family involved in discharge planning: PT- Patient, Family member/caregiver,  OT-Patient, SLP-Patient  MD ELOS: 7d Medical Rehab Prognosis:  Good Assessment:  69 year old right-handed male with prior history of CVA at age 78 with residual mild left-sided weakness, hypertension, hyperlipidemia and signs of early vascular dementia. Patient currently not taking medications he stopped taking that approxi-1 year ago. Per chart review lives with spouse. 1 level home 5 steps to entry. Independent with ADLs ambulates without assistive device and still drives. Presented 12/15/2019 with acute onset of gait unsteadiness. Cranial CT scan negative. Patient did not receive TPA. MRI showed small acute infarct right thalamocapsularregion. CT angiogram head and neck no emergent large vessel occlusion. Echocardiogram with ejection fraction of 65% without emboli. Admission chemistries unremarkable. Maintained on aspirin and Plavix for CVA prophylaxis x3 weeks then aspirin alone. Subcutaneous Lovenox for DVT prophylaxis. Mechanical soft diet with nectar thick liquids. Therapy evaluations completed and patient was admitted for a comprehensive rehab program.   Now requiring 24/7 Rehab RN,MD, as well as CIR level PT, OT and SLP.  Treatment team will focus on ADLs and mobility with goals set at Supervision  See Team Conference Notes for weekly updates to the plan of care

## 2019-12-21 NOTE — Progress Notes (Signed)
Physical Medicine and Rehabilitation Consult Reason for Consult: Unsteady gait Referring Physician: Internal medicine     HPI: Eric Barron is a 69 y.o. 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  Patient currently not take any prescription medications he stopped taking them approximately 1 year ago.  Per chart review lives with spouse.  1 level home 5 steps to entry.  Independent with ADLs ambulates without assistive device and still drives.  Presented 12/15/2019 with acute onset of gait unsteadiness.  Cranial CT scan negative.  Patient did not receive TPA.  MRI showed small acute infarct right thalamocapsular region.  CT angiogram head and neck no emergent large vessel occlusion.  Echocardiogram with ejection fraction of 65% without emboli.  Admission chemistries unremarkable.  Currently maintained on aspirin and Plavix for CVA prophylaxis x3 weeks then aspirin alone.  Subcutaneous Lovenox for DVT prophylaxis.  Mechanical soft nectar thick liquid diet.  Therapy evaluations completed with recommendations of physical medicine rehab consult.   Pt's wife says he's more focused today- more "himself" LBM this AM.  Says was weak when was at home, but wife was clear, pt was walking 4-6 miles/day and was not falling- now if having LOB with PT at 100 ft.  Per wife, since pt lost 40 lbs, decided to stop ALL Meds- including HTN meds and ASA.   Review of Systems  Constitutional: Negative for chills and fever.  HENT: Negative for hearing loss.   Eyes: Negative for blurred vision and double vision.  Respiratory: Negative for cough and shortness of breath.   Cardiovascular: Negative for chest pain and palpitations.  Gastrointestinal: Positive for constipation. Negative for heartburn and nausea.  Genitourinary: Negative for flank pain and hematuria.  Musculoskeletal: Positive for  myalgias.  Skin: Negative for rash.  Neurological: Positive for weakness.  All other systems reviewed and are negative.   No past medical history on file. No family history on file. Social History:  has no history on file for tobacco, alcohol, and drug. Allergies: No Known Allergies No medications prior to admission.      Home: Home Living Family/patient expects to be discharged to:: Private residence Living Arrangements: Spouse/significant other(Debby) Available Help at Discharge: Family Type of Home: House Home Access: Stairs to enter Technical brewer of Steps: 5 Entrance Stairs-Rails: Right, Left Home Layout: One level Bathroom Shower/Tub: Chiropodist: Standard Home Equipment: [place] - single point, Environmental consultant - 2 wheels, Grab bars - tub/shower  Functional History: Prior Function Level of Independence: Independent Comments: Pt independent in ADLs, IADLs, and mobility. Pt ambulates without an assistive device, up to 6 miles a day. 0 falls. Still drives. Functional Status:  Mobility: Bed Mobility Overal bed mobility: Needs Assistance Bed Mobility: Supine to Sit, Sit to Supine Supine to sit: Supervision Sit to supine: Supervision General bed mobility comments: Use of bed rails. Assist for trunk control  Transfers Overall transfer level: Needs assistance Equipment used: None Transfers: Sit to/from Stand, Stand Pivot Transfers Sit to Stand: Min guard Stand pivot transfers: Mod assist General transfer comment: Initial retro LOB upon standing with pt requiring assist to self-correct. Left lateral LOB when pivoting left. Ambulation/Gait Ambulation/Gait assistance: Mod assist, Min assist Gait Distance (Feet): 80 Feet Assistive device: None, Rolling walker (2 wheeled) Gait Pattern/deviations: Step-through pattern, Decreased step length - left, Decreased dorsiflexion - left General Gait Details:  Pt initially ambulating in room with several  gross episodes of loss of balance posteriorly and laterally towards the left. Mildly improved with use of walker in hallway, progressing to a min assist level. Cues for walker proximity. Gait velocity: decreased   ADL: ADL Overall ADL's : Needs assistance/impaired Eating/Feeding: Set up, Bed level Grooming: Minimal assistance, Standing Grooming Details (indicate cue type and reason): While standing at the sink. Assist for balance Upper Body Bathing: Minimal assistance, Sitting Upper Body Bathing Details (indicate cue type and reason): To maintain balance Lower Body Bathing: Minimal assistance, Moderate assistance, Sit to/from stand Lower Body Bathing Details (indicate cue type and reason): To maintain balance.  Upper Body Dressing : Minimal assistance, Sitting Lower Body Dressing: Minimal assistance, Moderate assistance, Sitting/lateral leans, Sit to/from stand Toilet Transfer: Minimal assistance, Ambulation, Regular Toilet, Grab bars Toileting- Clothing Manipulation and Hygiene: Minimal assistance, Moderate assistance, Sit to/from stand Functional mobility during ADLs: Moderate assistance, Rolling walker General ADL Comments: Pt able to ambulate to/from bathroom with RW and mod assist. Noted several instances of retro and left lateral LOB with pt requiring assist to self-correct.   Cognition: Cognition Overall Cognitive Status: History of cognitive impairments - at baseline Orientation Level: Oriented to person, Oriented to place, Disoriented to time, Oriented to situation Cognition Arousal/Alertness: Awake/alert Behavior During Therapy: WFL for tasks assessed/performed Overall Cognitive Status: History of cognitive impairments - at baseline General Comments: A&O x 3. Pt pleasant and willing to participate in therapy. Pt able to follow simple one step instructions with 75% accuracy. Cues for safety.   Blood pressure (!) 175/86, pulse 60, temperature 98.5 F (36.9 C), resp. rate 17,  SpO2 100 %. Physical Exam  Nursing note and vitals reviewed. Constitutional: He appears well-developed and well-nourished.  Sitting up in bedside chair, finished soft D3 thin lunch, wife at bedside, NAD; occ off topic, hard to keep on topic  HENT:  Head: Normocephalic and atraumatic.  Nose: Nose normal.  Mouth/Throat: Oropharynx is clear and moist. No oropharyngeal exudate.  R facial droop- improves with smile sensation intact on face Tongue midline  Eyes: Conjunctivae are normal. Right eye exhibits no discharge. Left eye exhibits no discharge. No scleral icterus.  EOMI B/L No nystagmus  Neck: No tracheal deviation present.  Cardiovascular:  RRR- no JVD  Respiratory: No stridor.  CTA B/L- good air movement  GI:  Soft, NT< ND, (+)BS hyperactive (just ate)  Musculoskeletal:     Cervical back: Normal range of motion and neck supple.     Comments: RUE- deltoid 4+/5, biceps 4+/5, triceps 4+/5, grip 5-/5, finger 5-/5 LUE- deltoid, biceps and triceps 4+/5, grip/finger 5-/5 RLE- HF 4/5, KE 4/5, DF/PF 5-/5 LLE- HF 4+/5, KE 4+/5, DF and PF 5-/5  Effort was touch and go  Neurological:  Patient is alert in no acute distress makes eye contact with examiner follows commands.  Oriented x3.  Pt could not stay on topic Vague, saying things occ that made no sense, but answered questions appropriately Sensation intact to light touch in all 4 extremities Finger to nose slowed B/L- a  Little worse on R  Skin: Skin is warm and dry.  Psychiatric:  Appropriate, but tangential      Lab Results Last 24 Hours  No results found for this or any previous visit (from the past 24 hour(s)).    Imaging Results (Last 48 hours)  CT ANGIO HEAD W OR WO CONTRAST   Result Date: 12/16/2019 CLINICAL DATA:  Stroke follow-up EXAM: CT ANGIOGRAPHY  HEAD AND NECK TECHNIQUE: Multidetector CT imaging of the head and neck was performed using the standard protocol during bolus administration of intravenous contrast.  Multiplanar CT image reconstructions and MIPs were obtained to evaluate the vascular anatomy. Carotid stenosis measurements (when applicable) are obtained utilizing NASCET criteria, using the distal internal carotid diameter as the denominator. CONTRAST:  46mL OMNIPAQUE IOHEXOL 350 MG/ML SOLN COMPARISON:  None. FINDINGS: CTA NECK FINDINGS SKELETON: There is no bony spinal canal stenosis. No lytic or blastic lesion. OTHER NECK: Normal pharynx, larynx and major salivary glands. No cervical lymphadenopathy. Unremarkable thyroid gland. UPPER CHEST: No pneumothorax or pleural effusion. No nodules or masses. AORTIC ARCH: There is mild calcific atherosclerosis of the aortic arch. There is no aneurysm, dissection or hemodynamically significant stenosis of the visualized portion of the aorta. Conventional 3 vessel aortic branching pattern. The visualized proximal subclavian arteries are widely patent. RIGHT CAROTID SYSTEM: No dissection, occlusion or aneurysm. There is mixed density atherosclerosis extending into the proximal ICA, resulting in less than 50% stenosis. LEFT CAROTID SYSTEM: No dissection, occlusion or aneurysm. There is mixed density atherosclerosis extending into the proximal ICA, resulting in less than 50% stenosis. VERTEBRAL ARTERIES: Left dominant configuration. Both origins are clearly patent. There is no dissection, occlusion or flow-limiting stenosis to the skull base (V1-V3 segments). CTA HEAD FINDINGS POSTERIOR CIRCULATION: --Vertebral arteries: Normal V4 segments. --Posterior inferior cerebellar arteries (PICA): Patent origins from the vertebral arteries. --Anterior inferior cerebellar arteries (AICA): Patent origins from the basilar artery. --Basilar artery: Normal. --Superior cerebellar arteries: Normal. --Posterior cerebral arteries: Normal. Both originate from the basilar artery. Posterior communicating arteries (p-comm) are diminutive or absent. ANTERIOR CIRCULATION: --Intracranial internal  carotid arteries: Normal. --Anterior cerebral arteries (ACA): Normal. Both A1 segments are present. Patent anterior communicating artery (a-comm). --Middle cerebral arteries (MCA): Normal. VENOUS SINUSES: As permitted by contrast timing, patent. ANATOMIC VARIANTS: None Review of the MIP images confirms the above findings. IMPRESSION: 1. No emergent large vessel occlusion or hemodynamically significant stenosis of the head or neck. 2. Bilateral proximal ICA atherosclerosis with less than 50% stenosis. Electronically Signed   By: Ulyses Jarred M.D.   On: 12/16/2019 19:38    CT ANGIO NECK W OR WO CONTRAST   Result Date: 12/16/2019 CLINICAL DATA:  Stroke follow-up EXAM: CT ANGIOGRAPHY HEAD AND NECK TECHNIQUE: Multidetector CT imaging of the head and neck was performed using the standard protocol during bolus administration of intravenous contrast. Multiplanar CT image reconstructions and MIPs were obtained to evaluate the vascular anatomy. Carotid stenosis measurements (when applicable) are obtained utilizing NASCET criteria, using the distal internal carotid diameter as the denominator. CONTRAST:  56mL OMNIPAQUE IOHEXOL 350 MG/ML SOLN COMPARISON:  None. FINDINGS: CTA NECK FINDINGS SKELETON: There is no bony spinal canal stenosis. No lytic or blastic lesion. OTHER NECK: Normal pharynx, larynx and major salivary glands. No cervical lymphadenopathy. Unremarkable thyroid gland. UPPER CHEST: No pneumothorax or pleural effusion. No nodules or masses. AORTIC ARCH: There is mild calcific atherosclerosis of the aortic arch. There is no aneurysm, dissection or hemodynamically significant stenosis of the visualized portion of the aorta. Conventional 3 vessel aortic branching pattern. The visualized proximal subclavian arteries are widely patent. RIGHT CAROTID SYSTEM: No dissection, occlusion or aneurysm. There is mixed density atherosclerosis extending into the proximal ICA, resulting in less than 50% stenosis. LEFT CAROTID  SYSTEM: No dissection, occlusion or aneurysm. There is mixed density atherosclerosis extending into the proximal ICA, resulting in less than 50% stenosis. VERTEBRAL ARTERIES: Left dominant configuration. Both origins are  clearly patent. There is no dissection, occlusion or flow-limiting stenosis to the skull base (V1-V3 segments). CTA HEAD FINDINGS POSTERIOR CIRCULATION: --Vertebral arteries: Normal V4 segments. --Posterior inferior cerebellar arteries (PICA): Patent origins from the vertebral arteries. --Anterior inferior cerebellar arteries (AICA): Patent origins from the basilar artery. --Basilar artery: Normal. --Superior cerebellar arteries: Normal. --Posterior cerebral arteries: Normal. Both originate from the basilar artery. Posterior communicating arteries (p-comm) are diminutive or absent. ANTERIOR CIRCULATION: --Intracranial internal carotid arteries: Normal. --Anterior cerebral arteries (ACA): Normal. Both A1 segments are present. Patent anterior communicating artery (a-comm). --Middle cerebral arteries (MCA): Normal. VENOUS SINUSES: As permitted by contrast timing, patent. ANATOMIC VARIANTS: None Review of the MIP images confirms the above findings. IMPRESSION: 1. No emergent large vessel occlusion or hemodynamically significant stenosis of the head or neck. 2. Bilateral proximal ICA atherosclerosis with less than 50% stenosis. Electronically Signed   By: Ulyses Jarred M.D.   On: 12/16/2019 19:38    MR BRAIN WO CONTRAST   Result Date: 12/15/2019 CLINICAL DATA:  Slurred speech, code stroke follow-up EXAM: MRI HEAD WITHOUT CONTRAST TECHNIQUE: Multiplanar, multiecho pulse sequences of the brain and surrounding structures were obtained without intravenous contrast. COMPARISON:  2018 FINDINGS: Brain: There is a small subcentimeter focus of reduced diffusion in the region of the posterior right internal capsule/lateral thalamus. Patchy and confluent areas of T2 hyperintensity in the supratentorial and  pontine white matter are nonspecific but may reflect moderate chronic microvascular ischemic changes. There are chronic small vessel infarcts of the right putamen, left thalamus, and left subinsular white matter. No evidence of intracranial hemorrhage. There is no intracranial mass, mass effect, or edema. There is no hydrocephalus or extra-axial fluid collection. Vascular: Major vessel flow voids at the skull base are preserved. Skull and upper cervical spine: Normal marrow signal is preserved. Sinuses/Orbits: Minor mucosal thickening.  Orbits are unremarkable. Other: Sella is unremarkable.  Mastoid air cells are clear. IMPRESSION: Small acute infarct of the right thalamocapsular region. Chronic microvascular ischemic changes and chronic small vessel infarcts detailed above. Electronically Signed   By: Macy Mis M.D.   On: 12/15/2019 14:50    DG Swallowing Func-Speech Pathology   Result Date: 12/16/2019 Objective Swallowing Evaluation: Type of Study: MBS-Modified Barium Swallow Study  Patient Details Name: Eric Barron MRN: [id number] Date of Birth: 06-Nov-1950 Today's Date: 12/16/2019 Time: SLP Start Time (ACUTE ONLY): 0900 -SLP Stop Time (ACUTE ONLY): 0925 SLP Time Calculation (min) (ACUTE ONLY): 25 min Past Medical History: No past medical history on file. Past Surgical History: HPI: 69 yo male adm to [facility] with confusion, disorientation.  Pt has h/o pontine CVA (left) and thalamic cva in 2003.  He is LEFT HANDED.  Pt also with h/o tobacco use.  Imaging study showed right thalamocapsular CVA.  Pt did not pass the [facility] screen and SLP was ordered.  SLP encountered pt awake and reporting hunger.  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  Cervical spine appears to impinge into pharynx/cervical esophagus preventing adequate epiglottic  deflection as it contacts posterior pharyngeal wall thus contributing to pharyngeal retention.  Oral deficits with impaired oral bolus manipulation and decreased propulsion with oral residuals.  Pharyngeal swallow c/b decreased tongue base retraction, impaired laryngeal elevation/closure resulting in vallecular residuals and aspiration of thin (mostly silent - delayed cough with larger amount).  Chin tuck and head turn left/right did not prevent penetration or aspiration  consistently nor did it decrease retention.  Reflexive dry swallows helpful to clear pharyngeal retention *secretions retained also.  Cued effortful swallow likely improved pharyngeal clearance due to improved muscle recruitment. Barium tablet swallowed with pudding halted in esophagus *appeared near aortic arch but radiologist not present to confirm*.  Of note, pt was not sensate to halted tablet.  Liquid swallows appeared to aid transit of tablet into esophagus.  Pt was educated to all findings/recommendations live with monitor and with written precaution sign using teach back for clinical reasoning.  Pt reports his swallow ability to be at baseline - stating he has coughed with intake for as long as she can recall.  However given this new cva event with decreased mobilitization, congested cough and h/o smoking - his aspiration pna risk will be higher.  Recommend dys3/nectar liquids and allow thin water between meals after oral care. SLP Visit Diagnosis Dysphagia, oropharyngeal phase (R13.12);Dysphagia, pharyngoesophageal phase (R13.14) Attention and concentration deficit following -- Frontal lobe and executive function deficit following -- Impact on safety and function Moderate aspiration risk   CHL IP TREATMENT RECOMMENDATION 12/16/2019 Treatment Recommendations Therapy as outlined in treatment plan below   Prognosis 12/16/2019 Prognosis for Safe Diet Advancement Fair Barriers to Reach Goals Time post onset Barriers/Prognosis Comment -- CHL IP  DIET RECOMMENDATION 12/16/2019 SLP Diet Recommendations Dysphagia 3 (Mech soft) solids;Nectar thick liquid Liquid Administration via Cup;Straw Medication Administration Whole meds with puree Compensations Slow rate;Small sips/bites Postural Changes Remain semi-upright after after feeds/meals (Comment)   CHL IP OTHER RECOMMENDATIONS 12/16/2019 Recommended Consults -- Oral Care Recommendations Oral care BID Other Recommendations --   CHL IP FOLLOW UP RECOMMENDATIONS 12/16/2019 Follow up Recommendations Home health SLP   CHL IP FREQUENCY AND DURATION 12/16/2019 Speech Therapy Frequency (ACUTE ONLY) min 1 x/week Treatment Duration 1 week      CHL IP ORAL PHASE 12/16/2019 Oral Phase Impaired Oral - Pudding Teaspoon -- Oral - Pudding Cup -- Oral - Honey Teaspoon -- Oral - Honey Cup -- Oral - Nectar Teaspoon Reduced posterior propulsion;Premature spillage;Weak lingual manipulation Oral - Nectar Cup Reduced posterior propulsion;Premature spillage;Weak lingual manipulation Oral - Nectar Straw Reduced posterior propulsion;Premature spillage;Weak lingual manipulation Oral - Thin Teaspoon Reduced posterior propulsion;Premature spillage;Weak lingual manipulation Oral - Thin Cup Reduced posterior propulsion;Premature spillage;Weak lingual manipulation Oral - Thin Straw Reduced posterior propulsion;Premature spillage;Weak lingual manipulation Oral - Puree Reduced posterior propulsion;Weak lingual manipulation;Premature spillage Oral - Mech Soft Reduced posterior propulsion;Weak lingual manipulation;Premature spillage Oral - Regular -- Oral - Multi-Consistency -- Oral - Pill Reduced posterior propulsion;Weak lingual manipulation;Premature spillage Oral Phase - Comment --  CHL IP PHARYNGEAL PHASE 12/16/2019 Pharyngeal Phase Impaired Pharyngeal- Pudding Teaspoon -- Pharyngeal -- Pharyngeal- Pudding Cup -- Pharyngeal -- Pharyngeal- Honey Teaspoon -- Pharyngeal -- Pharyngeal- Honey Cup -- Pharyngeal -- Pharyngeal- Nectar Teaspoon --  Pharyngeal -- Pharyngeal- Nectar Cup Reduced epiglottic inversion;Pharyngeal residue - valleculae;Reduced tongue base retraction Pharyngeal Material does not enter airway Pharyngeal- Nectar Straw Pharyngeal residue - valleculae;Reduced tongue base retraction;Reduced epiglottic inversion Pharyngeal Material does not enter airway Pharyngeal- Thin Teaspoon Reduced laryngeal elevation;Reduced anterior laryngeal mobility;Reduced epiglottic inversion;Reduced airway/laryngeal closure;Penetration/Aspiration during swallow;Delayed swallow initiation-pyriform sinuses;Reduced tongue base retraction Pharyngeal Material enters airway, CONTACTS cords and not ejected out Pharyngeal- Thin Cup Delayed swallow initiation-pyriform sinuses;Penetration/Aspiration during swallow;Trace aspiration;Pharyngeal residue - valleculae;Reduced epiglottic inversion;Reduced tongue base retraction Pharyngeal Material enters airway, passes BELOW cords without attempt by patient to eject out (silent aspiration) Pharyngeal- Thin Straw Delayed swallow initiation-pyriform sinuses;Penetration/Aspiration during swallow;Trace aspiration;Moderate aspiration;Pharyngeal residue - valleculae;Reduced epiglottic  inversion;Reduced tongue base retraction Pharyngeal -- Pharyngeal- Puree Reduced epiglottic inversion;Pharyngeal residue - valleculae;Reduced tongue base retraction Pharyngeal Material does not enter airway Pharyngeal- Mechanical Soft Reduced epiglottic inversion;Pharyngeal residue - valleculae;Reduced tongue base retraction Pharyngeal Material does not enter airway Pharyngeal- Regular -- Pharyngeal -- Pharyngeal- Multi-consistency -- Pharyngeal -- Pharyngeal- Pill Pharyngeal residue - valleculae;Reduced epiglottic inversion Pharyngeal Material does not enter airway Pharyngeal Comment various postures including head turn right/left with and without chin tuck were not helpful to prevent residuals not penetration/aspiration, cued cough did not clear deeper  aspirates, aspiration was mild, anatomical abnormality *appeararance of cervical spine degenerative dx vs osteophytes protruding into pharynx diminished epiglottic deflection and contributed to residuals  CHL IP CERVICAL ESOPHAGEAL PHASE 12/16/2019 Cervical Esophageal Phase Impaired Pudding Teaspoon -- Pudding Cup -- Honey Teaspoon -- Honey Cup -- Nectar Teaspoon -- Nectar Cup -- Nectar Straw -- Thin Teaspoon -- Thin Cup -- Thin Straw -- Puree -- Mechanical Soft -- Regular -- Multi-consistency -- Pill -- Cervical Esophageal Comment Barium tablet swallowed with pudding halted in esophagus *appeared near aortic arch but radiologist not present to confirm*.  Of note, pt was not sensate to halted tablet.  Liquid swallows appeared to aid transit of tablet into esophagus Macario Golds 12/16/2019, 10:24 AM  Kathleen Lime, MS [facility] SLP Acute Rehab Services Office [phone]              ECHOCARDIOGRAM COMPLETE BUBBLE STUDY   Result Date: 12/16/2019    ECHOCARDIOGRAM REPORT   Patient Name:   Eric Barron Date of Exam: 12/16/2019 Medical Rec #:  [id number]        Height: Accession #:    [id number]       Weight: Date of Birth:  1951-03-14       BSA: Patient Age:    58 years         BP:           211/91 mmHg Patient Gender: M                HR:           57 bpm. Exam Location:  Inpatient Procedure: 2D Echo, Cardiac Doppler and Color Doppler Indications:    CVA  History:        Patient has no prior history of Echocardiogram examinations.                 Stroke; Risk Factors:Hypertension.  Sonographer:    Dustin Flock Referring Phys: [place]  1. Left ventricular ejection fraction, by estimation, is 60 to 65%. The left ventricle has normal function. The left ventricle has no regional wall motion abnormalities. There is moderate concentric left ventricular hypertrophy. Left ventricular diastolic parameters are indeterminate.  2. Right ventricular systolic function is normal. The right ventricular  size is normal.  3. The mitral valve is normal in structure. Trivial mitral valve regurgitation. No evidence of mitral stenosis.  4. The aortic valve is grossly normal. Aortic valve regurgitation is not visualized. No aortic stenosis is present.  5. The inferior vena cava is normal in size with <50% respiratory variability, suggesting right atrial pressure of 8 mmHg.  6. Agitated saline contrast bubble study was negative, with no evidence of any interatrial shunt. Comparison(s): No prior Echocardiogram. Conclusion(s)/Recommendation(s): No intracardiac source of embolism detected on this transthoracic study. A transesophageal echocardiogram is recommended to exclude cardiac source of embolism if clinically indicated. FINDINGS  Left Ventricle: Left ventricular ejection fraction, by estimation, is  60 to 65%. The left ventricle has normal function. The left ventricle has no regional wall motion abnormalities. The left ventricular internal cavity size was normal in size. There is  moderate concentric left ventricular hypertrophy. Left ventricular diastolic parameters are indeterminate. Right Ventricle: The right ventricular size is normal. No increase in right ventricular wall thickness. Right ventricular systolic function is normal. Left Atrium: Left atrial size was normal in size. Right Atrium: Right atrial size was normal in size. Pericardium: There is no evidence of pericardial effusion. Mitral Valve: The mitral valve is normal in structure. Trivial mitral valve regurgitation. No evidence of mitral valve stenosis. Tricuspid Valve: The tricuspid valve is normal in structure. Tricuspid valve regurgitation is trivial. No evidence of tricuspid stenosis. Aortic Valve: The aortic valve is grossly normal. Aortic valve regurgitation is not visualized. No aortic stenosis is present. Pulmonic Valve: The pulmonic valve was not well visualized. Pulmonic valve regurgitation is not visualized. No evidence of pulmonic stenosis.  Aorta: The aortic root, ascending aorta and aortic arch are all structurally normal, with no evidence of dilitation or obstruction. Pulmonary Artery: The pulmonary artery is not well seen. Venous: The inferior vena cava is normal in size with less than 50% respiratory variability, suggesting right atrial pressure of 8 mmHg. IAS/Shunts: No atrial level shunt detected by color flow Doppler. Agitated saline contrast was given intravenously to evaluate for intracardiac shunting. Agitated saline contrast bubble study was negative, with no evidence of any interatrial shunt.  LEFT VENTRICLE PLAX 2D LVIDd:         4.50 cm  Diastology LVIDs:         2.90 cm  LV e' lateral:   8.38 cm/s LV PW:         1.50 cm  LV E/e' lateral: 6.9 LV IVS:        1.50 cm  LV e' medial:    5.11 cm/s LVOT diam:     2.00 cm  LV E/e' medial:  11.4 LV SV:         68 LVOT Area:     3.14 cm  RIGHT VENTRICLE RV Basal diam:  2.90 cm RV S prime:     14.30 cm/s TAPSE (M-mode): 2.4 cm LEFT ATRIUM LA diam:        3.50 cm LA Vol (A2C):   47.4 ml LA Vol (A4C):   38.5 ml LA Biplane Vol: 46.8 ml  AORTIC VALVE LVOT Vmax:   105.00 cm/s LVOT Vmean:  66.500 cm/s LVOT VTI:    0.217 m  AORTA Ao Root diam: 2.90 cm MITRAL VALVE MV Area (PHT): 1.98 cm    SHUNTS MV Decel Time: 384 msec    Systemic VTI:  0.22 m MV E velocity: 58.20 cm/s  Systemic Diam: 2.00 cm MV A velocity: 88.30 cm/s MV E/A ratio:  0.66 Buford Dresser MD Electronically signed by Buford Dresser MD Signature Date/Time: 12/16/2019/4:24:11 PM    Final     CT HEAD CODE STROKE WO CONTRAST   Result Date: 12/15/2019 CLINICAL DATA:  Code stroke.  Slurred speech EXAM: CT HEAD WITHOUT CONTRAST TECHNIQUE: Contiguous axial images were obtained from the base of the skull through the vertex without intravenous contrast. COMPARISON:  Correlation made with prior brain MRI from 2018 FINDINGS: Brain: There is no acute intracranial hemorrhage, mass effect, or edema. Gray-white differentiation is  preserved. There are chronic small vessel infarcts of the left thalamus and right putamen. Additional patchy and confluent areas of hypoattenuation in the supratentorial white  matter are nonspecific but probably reflect moderate chronic microvascular ischemic changes. Prominence of the ventricles and sulci reflects minor generalized parenchymal volume loss. There is no extra-axial fluid collection. Vascular: No hyperdense vessel. There is atherosclerotic calcification at the skull base. Skull: Unremarkable. Sinuses/Orbits: No acute abnormality. Other: Mastoid air cells are clear. ASPECTS ([place] Stroke Program Early CT Score) - Ganglionic level infarction (caudate, lentiform nuclei, internal capsule, insula, M1-M3 cortex): 7 - Supraganglionic infarction (M4-M6 cortex): 3 Total score (0-10 with 10 being normal): 10 IMPRESSION: No acute intracranial hemorrhage or evidence of acute infarction. ASPECT score is 10. Moderate chronic microvascular ischemic changes. Small chronic infarcts of left thalamus and right putamen. These results were communicated to Dr. Cheral Marker at 1:40 pmon 3/30/2021by text page via the [facility] messaging system. Electronically Signed   By: Macy Mis M.D.   On: 12/15/2019 13:43         Assessment/Plan: Diagnosis: R thalamic infarct 1. Does the need for close, 24 hr/day medical supervision in concert with the patient's rehab needs make it unreasonable for this patient to be served in a less intensive setting? Yes 2. Co-Morbidities requiring supervision/potential complications: HTN- noncompliant, previous CVA 20 yrs ago; prediabetes A1c 6.0; dysphagia; loss of balance 3. Due to bowel management, safety, skin/wound care, disease management, medication administration and patient education, does the patient require 24 hr/day rehab nursing? Yes 4. Does the patient require coordinated care of a physician, rehab nurse, therapy disciplines of PT, OT and SLP to address physical and functional  deficits in the context of the above medical diagnosis(es)? Yes Addressing deficits in the following areas: balance, endurance, locomotion, strength, transferring, bathing, dressing, feeding, grooming, toileting, cognition and swallowing 5. Can the patient actively participate in an intensive therapy program of at least 3 hrs of therapy per day at least 5 days per week? Yes 6. The potential for patient to make measurable gains while on inpatient rehab is good 7. Anticipated functional outcomes upon discharge from inpatient rehab are modified independent  with PT, modified independent with OT, modified independent with SLP. 8. Estimated rehab length of stay to reach the above functional goals is: 5-7 days 9. Anticipated discharge destination: Home 10. Overall Rehab/Functional Prognosis: excellent   RECOMMENDATIONS: This patient's condition is appropriate for continued rehabilitative care in the following setting: CIR and HH Therapy Patient has agreed to participate in recommended program. Potentially Note that insurance prior authorization may be required for reimbursement for recommended care.   Comment:  1. Pt wants to go home, however think AT THIS MOMENT, could benefit from CIR- however if progresses greatly in next 24-48 hours, might get too good for CIR.  2. Educated pt it's necessary to continue meds for life time- or increases risk of another CVA.  3. Will see pt is doing in next 24-48 hrs- minimum cannot admit to CIR until tomorrow currently.  4. Thank you for this consult.    Lavon Paganini Angiulli, PA-C 12/17/2019

## 2019-12-21 NOTE — Care Management (Signed)
Patient Details  Name: Eric Barron MRN: [id number] Date of Birth: [birth year]  Today's Date: 12/21/2019  Problem List:  Patient Active Problem List   Diagnosis Date Noted  . Right thalamic infarction ([place]) 12/19/2019  . Essential hypertension 12/16/2019  . Hyperlipidemia 12/16/2019  . Prediabetes 12/16/2019  . CVA (cerebral vascular accident) ([place]) 12/15/2019   Past Medical History:  Past Medical History:  Diagnosis Date  . 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 CVA, old, dysarthria   . HTN (hypertension)    Past Surgical History: History reviewed. No pertinent surgical history. Social History:  reports that he quit smoking about 19 years ago. His smoking use included cigarettes. He started smoking about 26 years ago. He has a 6.00 pack-year smoking history. He has never used smokeless tobacco. No history on file for alcohol and drug.  Family / Support Systems Marital Status: Married Patient Roles: Spouse Anticipated Caregiver: spouse, Jackelyn Poling, and grand daughter Ability/Limitations of Caregiver: min assist Caregiver Availability: 24/7  Social History Preferred language: English Religion: Christian Reformed     Abuse/Neglect Abuse/Neglect Assessment Can Be Completed: Yes Physical Abuse: Denies Verbal Abuse: Denies Sexual Abuse: Denies Exploitation of patient/patient's resources: Denies Self-Neglect: Denies  Emotional Status Pt's affect, behavior and adjustment status: Pleasant, normal mood, affect and behavior  Patient / Family Perceptions, Expectations & Goals Pt/Family understanding of illness & functional limitations: Patient alert to self but somewhat disoriented to place, time and situation, reporting he doesn't remember anything Premorbid pt/family roles/activities: Independent prior to admission, active and walking 6-8 miles a day, hangs out at a SNF and was not driving Anticipated changes in  roles/activities/participation: May need assistance/supervision at discharge Pt/family expectations/goals: Patient would like to return to his pre-admission status  Occupational psychologist available at discharge: Wife to provide transportation at discharge Resource referrals recommended: Neuropsychology  Discharge Planning Living Arrangements: Spouse/significant other Support Systems: Spouse/significant other, Other relatives Type of Residence: Private residence Insurance Resources: [employer] Money Management: Spouse Does the patient have any problems obtaining your medications?: No(Stopped taking medications x 1 year) Home Management: Wife to manage the home Patient/Family Preliminary Plans: Discharge home with wife and grand-daughter to assist prn Sw Barriers to Discharge: Medication compliance, Inaccessible home environment, Decreased caregiver support Sw Barriers to Discharge Comments: 5 step entry to home Social Work Anticipated Follow Up Needs: HH/OP Expected length of stay: 5-7 days  Clinical Impression Patient was pleasant, cooperative however unaware of what was going on. Did not know how long he had been in the hospital or why. Reported nothing is different than before he was admitted. Asked several times how long he had been in the hospital and no recall of information reviewed until asked what he did during the day and where he went PTA. Reported he walked to a SNF and "hung out" "just to bother them" and then walked to the  "[employer] and then would walk back home. Could not recall where exactly he lived "don't know where I live, just know it is [place], or what side of town but I could show you". Unable to recall previous health issues or problems with blood pressure. Doesn't recall reason why he quit taking his medication (1 year ago) noted "he didn't think he needed it". Reported he could go to see his wife's doctor after discharge if needed. Currently with  HTN asymptomatic but ambulation is on hold. Reported he has had his morning medication for HTN. Requested  questions be deferred to his wife. Unable to recall her number: "I don't call her, I don't have a cell phone and don't want one". Encouraged to continue with therapy and I would contact wife. Unable to contact wife; several lines noted not available at this time and voice mail is full.  Dorien Chihuahua B 12/21/2019, 9:21 AM

## 2019-12-21 NOTE — Progress Notes (Signed)
PHYSICAL MEDICINE & REHABILITATION PROGRESS NOTE   Subjective/Complaints:   Resting witout c/os, does not remember if he had liquid BM, no abd pain. Oriented to self only   ROS: Patient denies CP, SOB, N/V/D  Objective:   No results found. Recent Labs    12/19/19 1724 12/21/19 0510  WBC 9.0 10.2  HGB 14.5 14.8  HCT 45.7 47.0  PLT 176 185   Recent Labs    12/19/19 1724 12/21/19 0510  NA  --  145  K  --  3.7  CL  --  108  CO2  --  27  GLUCOSE  --  115*  BUN  --  19  CREATININE 1.00 0.92  CALCIUM  --  9.1    Intake/Output Summary (Last 24 hours) at 12/21/2019 0801 Last data filed at 12/20/2019 1833 Gross per 24 hour  Intake 240 ml  Output --  Net 240 ml     Physical Exam: Vital Signs Blood pressure (!) 197/91, pulse 65, temperature 98.2 F (36.8 C), resp. rate 18, height 5\' 9"  (1.753 m), weight 78.6 kg, SpO2 96 %. Constitutional: No distress . Vital signs reviewed.  General: No acute distress Mood and affect are appropriate Heart: Regular rate and rhythm no rubs murmurs or extra sounds Lungs: Clear to auscultation, breathing unlabored, no rales or wheezes Abdomen: Positive bowel sounds, soft nontender to palpation, nondistended Extremities: No clubbing, cyanosis, or edema Skin: No evidence of breakdown, no evidence of rash  Sensory exam normal sensation to light touch and proprioception in bilateral upper and lower extremities  Musculoskeletal: Full range of motion in all 4 extremities. No joint swelling Fair insight and awareness. Delayed processing. STM deficits. RUE 5/5. RLE 5/5. LUE 4/5. LLE 4+/5. PD LUE, dysmetria LUE,LLE. Senses pain and LT equally right to left. Skin: Skin iswarmand dry. He isnot diaphoretic.       Assessment/Plan: 1. Functional deficits secondary to right thalamic infarct which require 3+ hours per day of interdisciplinary therapy in a comprehensive inpatient rehab setting.  Physiatrist is providing close team  supervision and 24 hour management of active medical problems listed below.  Physiatrist and rehab team continue to assess barriers to discharge/monitor patient progress toward functional and medical goals  Care Tool:  Bathing    Body parts bathed by patient: Right arm, Left arm, Chest, Abdomen, Front perineal area, Buttocks, Right upper leg, Left upper leg, Right lower leg, Left lower leg, Face         Bathing assist Assist Level: Contact Guard/Touching assist     Upper Body Dressing/Undressing Upper body dressing   What is the patient wearing?: Pull over shirt    Upper body assist Assist Level: Supervision/Verbal cueing    Lower Body Dressing/Undressing Lower body dressing      What is the patient wearing?: Underwear/pull up, Pants     Lower body assist Assist for lower body dressing: Contact Guard/Touching assist     Toileting Toileting Toileting Activity did not occur (Clothing management and hygiene only): N/A (no void or bm)  Toileting assist Assist for toileting: Contact Guard/Touching assist     Transfers Chair/bed transfer  Transfers assist     Chair/bed transfer assist level: Supervision/Verbal cueing     Locomotion Ambulation   Ambulation assist      Assist level: Minimal Assistance - Patient > 75% Assistive device: No Device Max distance: >350 ft   Walk 10 feet activity   Assist     Assist level: Minimal  Assistance - Patient > 75% Assistive device: No Device   Walk 50 feet activity   Assist    Assist level: Minimal Assistance - Patient > 75% Assistive device: No Device    Walk 150 feet activity   Assist    Assist level: Minimal Assistance - Patient > 75% Assistive device: No Device    Walk 10 feet on uneven surface  activity   Assist     Assist level: Minimal Assistance - Patient > 75% Assistive device: Other (comment)(intermittent use of 1 rail)   Wheelchair     Assist Will patient use wheelchair at  discharge?: No(patient is a functional ambulator)   Wheelchair activity did not occur: N/A         Wheelchair 50 feet with 2 turns activity    Assist    Wheelchair 50 feet with 2 turns activity did not occur: N/A       Wheelchair 150 feet activity     Assist  Wheelchair 150 feet activity did not occur: N/A       Blood pressure (!) 197/91, pulse 65, temperature 98.2 F (36.8 C), resp. rate 18, height 5\' 9"  (1.753 m), weight 78.6 kg, SpO2 96 %.  Medical Problem List and Plan: 1.Left-sided weakness with gait deficitsecondary to right thalamic infarction onset 12/15/19 as well as history of CVA in the past with residual left-sided weakness -patient may shower -ELOS/Goals: 5-7 days, mod I to supervision goals  Patient is beginning CIR therapies today including PT SLP and OT  2. Antithrombotics: -DVT/anticoagulation:Lovenox. -antiplatelet therapy: Aspirin 81 mg daily and Plavix 75 mg daily x3 weeks then aspirin alone(stop date 4/21) 3. Pain Management:Tylenol as needed 4. Mood:Provide emotional support -antipsychotic agents: N/A 5. Neuropsych: This patientiscapable of making decisions on hisown behalf. 6. Skin/Wound Care:Routine skin checks 7. Fluids/Electrolytes/Nutrition:Routine in and outs with follow-up chemistries ordered for Monday 8. Hyperlipidemia. Lipitor 9. Dysphagia. Dysphagia #3 nectar liquids. Follow-up speech therapy             -advance diet as tolerated             -encourage liquids             -BUN/Creat nl  10. Medical noncompliance. Counseling 11. HTN: pt on norvasc 10mg  at home  -bp's have been elevated thus far  -resume norvasc at 5mg  daily, will allow permissive HTN for another 1-2 d then will increase Norvasc    Vitals:   12/21/19 0447 12/21/19 0755  BP: (!) 187/88 (!) 197/91  Pulse: 81 65  Resp: 18   Temp: 98.2 F (36.8 C)   SpO2: 96%     LOS: 2 days A FACE TO  FACE EVALUATION WAS PERFORMED  Eric Barron 12/21/2019, 8:01 AM

## 2019-12-21 NOTE — Progress Notes (Signed)
Occupational Therapy Session Note  Patient Details  Name: Eric Barron MRN: [id number] Date of Birth: [birth year]  Today's Date: 12/21/2019 OT Individual Time: 1000-1055 OT Individual Time Calculation (min): 55 min   Short Term Goals: Week 1:  OT Short Term Goal 1 (Week 1): STGs=LTGs due to ELOS  Skilled Therapeutic Interventions/Progress Updates:    Pt greeted in bed with no c/o pain. Per pt "I feel fine." BP assessed with reading of 179/67. Relayed this to RN who reported MD wants bedlevel therapies today due to elevated BP. Pt is asymptomatic and reports his BP typically "runs high." With bed placed in chair position set pt up to complete oral care with nectar thickened water. He then requested water to drink so OT provided him with a small carton of nectar thickened water. Pt needed max cuing for recall of his swallowing strategies. No s/s aspiration. Per swallowing precautions, ok to allow pt to consume thin water between meals after oral care, therefore set pt up with 8 oz of thin water. He drank this intermittently during exercises with no s/s aspiration, cues for small sips and extra swallows. Guided pt through therapeutic exercises involving 4 limb bicycles for 10 seconds 5 sets, and 2# bar x30 reps. Pt remained in bed at end of session, told him to keep [facility] elevated for at least 30 minutes per swallowing instructions. He was agreeable, also notified RN. Left pt with all needs within reach and bed alarm set.   Therapy Documentation Precautions:  Precautions Precautions: Fall Restrictions Weight Bearing Restrictions: No ADL: ADL Eating: Set up Grooming: Contact guard Where Assessed-Grooming: Standing at sink Upper Body Bathing: Setup Where Assessed-Upper Body Bathing: Edge of bed Lower Body Bathing: Contact guard Where Assessed-Lower Body Bathing: Edge of bed Upper Body Dressing: Supervision/safety Where Assessed-Upper Body Dressing: Edge of bed Lower Body Dressing: Minimal  assistance Where Assessed-Lower Body Dressing: Edge of bed Toileting: Not assessed Toilet Transfer: Therapist, music Method: Ambulating(RW) Gaffer Transfer: Minimal assistance Social research officer, government Method: Ambulating(without AD)      Therapy/Group: Individual Therapy  Jennye Runquist A Darby Shadwick 12/21/2019, 12:18 PM

## 2019-12-21 NOTE — Progress Notes (Addendum)
0755 Notified provider of patient's high BP, 197/91, MAP 120, prior to Norvasc administration.   PT notified RN pt's SBP was 202 and she notified the provider. Bed level therapy was recommended per therapist and provider.  Oncoming RN was updated.

## 2019-12-21 NOTE — Progress Notes (Signed)
PMR Admission Coordinator Pre-Admission Assessment   Patient: Eric Barron is an 69 y.o., male MRN: [id number] DOB: 10-20-1950 Height:   Weight:                                                                                                                                                    Insurance Information HMO:     PPO:      PCP:      IPA:      80/20:      OTHER:  PRIMARY: Medicare A and B      Policy#: [id number]      Subscriber: pt CM Name:       Phone#:      Fax#:  Pre-Cert#: verified eligibility online      Employer:  Benefits:  Phone #:      Name:  Eff. Date: A 06/17/04, B 03/17/16     Deduct: $1484      Out of Pocket Max: n/a      Life Max: n/a CIR: 100%      SNF: 20 full days Outpatient: 80%     Co-Pay: 20% Home Health: 100%      Co-Pay:  DME: 80%     Co-Pay: 20% Providers: pt choice SECONDARY:       Policy#:       Subscriber:  CM Name:       Phone#:      Fax#:  Pre-Cert#:       Employer:  Benefits:  Phone #:      Name:  Eff. Date:      Deduct:       Out of Pocket Max:       Life Max:  CIR:       SNF:  Outpatient:      Co-Pay:  Home Health:       Co-Pay:  DME:      Co-Pay:    Medicaid Application Date:       Case Manager:  Disability Application Date:       Case Worker:    The "Data Collection Information Summary" for patients in Inpatient Rehabilitation Facilities with attached "Privacy Act [place] Records" was provided and verbally reviewed with: Patient and Family   Emergency Contact Information         Contact Information     Name Relation Home Work [place]   [id number]   [place]   [phone]           Current Medical History  Patient Admitting Diagnosis: CVA   History of Present Illness: Dvaughn Gwyn. 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  Patient  currently not  taking medications he stopped taking that approximately 1 year ago.  Presented 12/15/2019 with acute onset of gait unsteadiness.  Cranial CT scan negative.  Patient did not receive TPA.  MRI showed small acute infarct right thalamocapsular region.  CT angiogram head and neck no emergent large vessel occlusion.  Echocardiogram with ejection fraction of 65% without emboli.  Admission chemistries unremarkable.  Maintained on aspirin and Plavix for CVA prophylaxis x3 weeks then aspirin alone.  Subcutaneous Lovenox for DVT prophylaxis.  Mechanical soft diet with nectar thick liquids.  Therapy evaluations completed and patient was recommended for a comprehensive rehab program.   Complete NIHSS TOTAL: 4   Past Medical History  No past medical history on file.   Family History  family history is not on file.   Prior Rehab/Hospitalizations:  Has the patient had prior rehab or hospitalizations prior to admission? No   Has the patient had major surgery during 100 days prior to admission? No   Current Medications    Current Facility-Administered Medications:  .  amLODipine (NORVASC) tablet 5 mg, 5 mg, Oral, Daily, Earlene Plater, MD, 5 mg at 12/18/19 0930 .  aspirin chewable tablet 81 mg, 81 mg, Oral, Daily, Agyei, Obed K, MD, 81 mg at 12/18/19 0930 .  atorvastatin (LIPITOR) tablet 80 mg, 80 mg, Oral, q1800, Agyei, Obed K, MD, 80 mg at 12/17/19 1736 .  clopidogrel (PLAVIX) tablet 75 mg, 75 mg, Oral, Daily, Biby, Sharon L, NP, 75 mg at 12/18/19 0930 .  enoxaparin (LOVENOX) injection 40 mg, 40 mg, Subcutaneous, Q24H, Agyei, Obed K, MD, 40 mg at 12/17/19 2117 .  labetalol (NORMODYNE) injection 10 mg, 10 mg, Intravenous, PRN, Marianna Payment, MD   Patients Current Diet:     Diet Order                      DIET DYS 3 Room service appropriate? Yes; Fluid consistency: Nectar Thick  Diet effective now                   Precautions / Restrictions Precautions Precautions:  Fall Precaution Comments: CVA with residual left sided deficits Restrictions Weight Bearing Restrictions: No    Has the patient had 2 or more falls or a fall with injury in the past year?No   Prior Activity Level Community (5-7x/wk): very active PTA, walking without AD several miles per day, not driving   Prior Functional Level Prior Function Level of Independence: Independent Comments: Pt independent in ADLs, IADLs, and mobility. Pt ambulates without an assistive device, up to 6 miles a day. 0 falls. Still drives.   Self Care: Did the patient need help bathing, dressing, using the toilet or eating?  Independent   Indoor Mobility: Did the patient need assistance with walking from room to room (with or without device)? Independent   Stairs: Did the patient need assistance with internal or external stairs (with or without device)? Independent   Functional Cognition: Did the patient need help planning regular tasks such as shopping or remembering to take medications? Independent   Home Assistive Devices / Equipment Home Assistive Devices/Equipment: Dentures (specify type), Eyeglasses, Hand-held shower hose, Scales Home Equipment: Shower seat, Cane - single point, Walker - 2 wheels, Grab bars - tub/shower   Prior Device Use: Indicate devices/aids used by the patient prior to current illness, exacerbation or injury? None of the above   Current Functional Level Cognition   Overall Cognitive Status: History of cognitive impairments - at baseline Orientation  Level: Oriented to person, Oriented to place, Oriented to situation, Disoriented to time General Comments: Has difficulty following multi step commands, needs visual demonstration at times and repetition    Extremity Assessment (includes Sensation/Coordination)   Upper Extremity Assessment: Defer to OT evaluation RUE Deficits / Details: BUE ROM WFL. Weak bilaterally. Equal strength.  RUE Sensation: WNL LUE Deficits / Details: BUE  ROM WFL. Weak bilaterally. Equal strength LUE Sensation: WNL  Lower Extremity Assessment: RLE deficits/detail, LLE deficits/detail RLE Deficits / Details: Strength 5/5 RLE Sensation: WNL RLE Coordination: WNL LLE Deficits / Details: Strength 4/5 LLE Sensation: WNL LLE Coordination: WNL     ADLs   Overall ADL's : Needs assistance/impaired Eating/Feeding: Set up, Bed level Grooming: Minimal assistance, Standing Grooming Details (indicate cue type and reason): While standing at the sink. Assist for balance Upper Body Bathing: Minimal assistance, Sitting Upper Body Bathing Details (indicate cue type and reason): To maintain balance Lower Body Bathing: Minimal assistance, Moderate assistance, Sit to/from stand Lower Body Bathing Details (indicate cue type and reason): To maintain balance.  Upper Body Dressing : Minimal assistance, Sitting Lower Body Dressing: Minimal assistance, Moderate assistance, Sitting/lateral leans, Sit to/from stand Toilet Transfer: Minimal assistance, Ambulation, Regular Toilet, Grab bars Toileting- Clothing Manipulation and Hygiene: Minimal assistance, Moderate assistance, Sit to/from stand Functional mobility during ADLs: Moderate assistance, Rolling walker General ADL Comments: Pt able to ambulate to/from bathroom with RW and mod assist. Noted several instances of retro and left lateral LOB with pt requiring assist to self-correct.     Mobility   Overal bed mobility: Modified Independent Bed Mobility: Supine to Sit, Sit to Supine Supine to sit: Supervision Sit to supine: Supervision General bed mobility comments: Use of bed rails. Assist for trunk control      Transfers   Overall transfer level: Needs assistance Equipment used: None Transfers: Sit to/from Stand Sit to Stand: Min guard Stand pivot transfers: Mod assist General transfer comment: Close min guard to initially steady upon rise from bed and toilet     Ambulation / Gait / Stairs / Wheelchair  Mobility   Ambulation/Gait Ambulation/Gait assistance: Min assist, Mod assist Gait Distance (Feet): 100 Feet Assistive device: None Gait Pattern/deviations: Step-to pattern, Step-through pattern, Decreased step length - left, Decreased stance time - left, Decreased stride length, Decreased dorsiflexion - left, Decreased dorsiflexion - right, Shuffle General Gait Details: Pt with initially 2-3 episodes of gross loss of balance laterally, requiring modA to correct. Utilizing step to pattern and progressing to step through pattern with minA in hallway. Pt demonstrates decreased bilateral heel strike, decreased LLE stance time/weightbearing, shortened L step length, and decreased reciprocal arm swing. Cues provided for heel strike at initial contact, putting R arm by side (tends to be in high guard position). Gait velocity: decreased     Posture / Balance Dynamic Sitting Balance Sitting balance - Comments: Pt tolerated sitting EOB 10+ min with variable supervision to min assist. Occasional retro lean with pt requiring multimodal cues and assist to self-correct.  Static Standing Balance Tandem Stance - Right Leg: 0 Tandem Stance - Left Leg: 10 Rhomberg - Eyes Opened: 10 Balance Overall balance assessment: Needs assistance Sitting-balance support: Feet supported Sitting balance-Leahy Scale: Good Sitting balance - Comments: Pt tolerated sitting EOB 10+ min with variable supervision to min assist. Occasional retro lean with pt requiring multimodal cues and assist to self-correct.  Standing balance support: No upper extremity supported, During functional activity Standing balance-Leahy Scale: Fair Tandem Stance - Right Leg: 0 Tandem  Stance - Left Leg: 10 Rhomberg - Eyes Opened: 10     Special needs/care consideration BiPAP/CPAP no CPM no Continuous Drip IV no Dialysis no        Days n/a Life Vest no Oxygen no  Special Bed no Trach Size no Wound Vac (area) no      Location n/a Skin intact                         Bowel mgmt: continent Bladder mgmt: continent Diabetic mgmt no Behavioral consideration  no Chemo/radiation no        Previous Home Environment (from acute therapy documentation) Living Arrangements: Spouse/significant other(Debby) Available Help at Discharge: Family Type of Home: House Home Layout: One level Home Access: Stairs to enter Entrance Stairs-Rails: Right, Left Entrance Stairs-Number of Steps: 5 Bathroom Shower/Tub: Chiropodist: Standard Home Care Services: No   Discharge Living Setting Plans for Discharge Living Setting: Patient's home Type of Home at Discharge: House Discharge Home Layout: One level Discharge Home Access: Stairs to enter Entrance Stairs-Rails: Can reach both Entrance Stairs-Number of Steps: 5 Discharge Bathroom Shower/Tub: Tub/shower unit Discharge Bathroom Toilet: Standard Discharge Bathroom Accessibility: Yes How Accessible: Accessible via walker Does the patient have any problems obtaining your medications?: (pt previously non-compliant with medications)   Social/Family/Support Systems Patient Roles: Spouse Anticipated Caregiver: spouse, Jackelyn Poling, and grand daughter Anticipated Caregiver's Contact Information: Jackelyn Poling [phone] Ability/Limitations of Caregiver: min assist Caregiver Availability: 24/7 Discharge Plan Discussed with Primary Caregiver: Yes Is Caregiver In Agreement with Plan?: Yes Does Caregiver/Family have Issues with Lodging/Transportation while Pt is in Rehab?: No     Goals/Additional Needs Patient/Family Goal for Rehab: PT/OT/SLP supervision to mod I Expected length of stay: 5-7 days Dietary Needs: d3/nectar Pt/Family Agrees to Admission and willing to participate: Yes Program Orientation Provided & Reviewed with Pt/Caregiver Including Roles  & Responsibilities: Yes     Decrease burden of Care through IP rehab admission: n/a     Possible need for SNF placement upon  discharge: not anticipated.  Pt with excellent rehab prognosis and good family support.      Patient Condition: This patient's condition remains as documented in the consult dated 12/17/2019, in which the Rehabilitation Physician determined and documented that the patient's condition is appropriate for intensive rehabilitative care in an inpatient rehabilitation facility. Will admit to inpatient rehab Saturday, 12/19/19.   Preadmission Screen Completed By:  Michel Santee, PT, DPT 12/18/2019 10:49 AM ______________________________________________________________________   Discussed status with Dr. Naaman Plummer on 12/18/19 at 10:52 AM  and received approval for admission today.   Admission Coordinator:  Michel Santee, PT, DPT, time 10:52 AM Sudie Grumbling 12/18/19

## 2019-12-21 NOTE — Progress Notes (Signed)
Patient information reviewed and entered into eRehab System by Becky Aidel Davisson, PPS coordinator. Information including medical coding, function ability, and quality indicators will be reviewed and updated through discharge.   

## 2019-12-22 ENCOUNTER — Inpatient Hospital Stay (HOSPITAL_COMMUNITY): Payer: Medicare Other | Admitting: Speech Pathology

## 2019-12-22 ENCOUNTER — Inpatient Hospital Stay (HOSPITAL_COMMUNITY): Payer: Medicare Other | Admitting: Physical Therapy

## 2019-12-22 ENCOUNTER — Inpatient Hospital Stay (HOSPITAL_COMMUNITY): Payer: Medicare Other | Admitting: Occupational Therapy

## 2019-12-22 MED ORDER — AMLODIPINE BESYLATE 10 MG PO TABS
10.0000 mg | ORAL_TABLET | Freq: Every day | ORAL | Status: DC
  Administered 2019-12-22 – 2019-12-25 (×4): 10 mg via ORAL
  Filled 2019-12-22 (×4): qty 1

## 2019-12-22 NOTE — Progress Notes (Signed)
Occupational Therapy Session Note  Patient Details  Name: Eric Barron MRN: [id number] Date of Birth: [birth year]  Today's Date: 12/22/2019 OT Individual Time: 1405-1502 OT Individual Time Calculation (min): 57 min    Short Term Goals: Week 1:  OT Short Term Goal 1 (Week 1): STGs=LTGs due to ELOS  Skilled Therapeutic Interventions/Progress Updates:    Treatment session with focus on functional mobility and dynamic standing balance during therapeutic activities.  Pt received upright in recliner declining bathing/dressing but agreeable to therapy session.  Pt ambulated >150' with SPC with CGA and min cues for wayfinding.  Engaged in dynamic standing activity with focus on trunk rotation and balance reactions when reaching outside BOS and cross midline.  Incorporated fine motor control activities in to standing activity, pt reports no difference Rt to Lt UE.  Pt frequently confusing Rt and Lt when cued to utilize one vs the other.  Engaged in [place] in standing with pt demonstrating decreased recall of instructions and easily frustrated/impatient with task.  Pt required increased cues for sequencing: Right - red and Left - green lights and easily frustrated and confusing Rt and Lt UE.  Pt returned to room with need to toilet.  Ambulated back to room and in room with [facility] with supervision.  Pt completed toileting tasks with distant supervision.  Returned to recliner and left with seat belt alarm on and all needs in reach.  Therapy Documentation Precautions:  Precautions Precautions: Fall Restrictions Weight Bearing Restrictions: No General:   Vital Signs: Therapy Vitals Temp: 98.6 F (37 C) Temp Source: Oral Pulse Rate: 84 Resp: 19 BP: (!) 167/79 Patient Position (if appropriate): Sitting Oxygen Therapy SpO2: 98 % O2 Device: Room Air Pain: Pain Assessment Pain Scale: 0-10 Pain Score: 0-No pain   Therapy/Group: Individual Therapy  Simonne Come 12/22/2019, 3:19 PM

## 2019-12-22 NOTE — Plan of Care (Signed)
  Problem: Consults Goal: RH STROKE PATIENT EDUCATION Description: See Patient Education module for education specifics  Outcome: Progressing   Problem: RH KNOWLEDGE DEFICIT Goal: RH STG INCREASE KNOWLEDGE OF DYSPHAGIA/FLUID INTAKE Outcome: Progressing Goal: RH STG INCREASE KNOWLEGDE OF HYPERLIPIDEMIA Outcome: Progressing Goal: RH STG INCREASE KNOWLEDGE OF STROKE PROPHYLAXIS Outcome: Progressing

## 2019-12-22 NOTE — Progress Notes (Signed)
Physical Therapy Session Note  Patient Details  Name: Eric Barron MRN: [id number] Date of Birth: Dec 31, 1950  Today's Date: 12/22/2019 PT Individual Time: 1050-1200 PT Individual Time Calculation (min): 70 min   Short Term Goals: Week 1:  PT Short Term Goal 1 (Week 1): STG=LTG due to short ELOS.  Skilled Therapeutic Interventions/Progress Updates: Pt presented in recliner agreeable to thearpy. Pt denies pain at start of session. Pt donned shoes with set up. Ambulated to rehab gym with RW and CGA fading to close S. Pt participated in gait weaving through cones with and without RW. Pt noted mild instability when performed without AD. Trialed use of SPC ambulating 180f, stepping over thresholds and weaving through cones, all performed at CGA improving to close S. Pt then ambulated to room and transferred to w[facility]c. [facility] and participated in gait training with [place] [place] and in/out entrance. Pt noted to intermittently scrape LLE on ground more notable with fatigue. Pt educated on energy conservation while take seated rest breaks during ambulation as advised he was still in recovery process. Pt ambulated from outside to elevators and back to unit with SPC. Pt then participated in backwards walking and side stepping 248fx 2 ea without wall support. Pt ambulated back to room CGA with SPC and returned to recliner. Pt left in recliner with belt alarm on, call bell within reach and needs met.      Therapy Documentation Precautions:  Precautions Precautions: Fall Restrictions Weight Bearing Restrictions: No General:   Vital Signs:  Pain: Pain Assessment Pain Scale: 0-10 Pain Score: 0-No pain Mobility:   Locomotion :    Trunk/Postural Assessment :    Balance:   Exercises:   Other Treatments:      Therapy/Group: Individual Therapy  Netty Sullivant  Vuong Musa, PTA  12/22/2019, 2:04 PM

## 2019-12-22 NOTE — Progress Notes (Signed)
Patient BP 184/93 this morning. Communication put in for the provider for evaluation. No signs of acute distress. No c/o headache. He ambulated to the restroom without difficulty with the assist of a rolling walker & stand by assist. Will continue to monitor & pass on to oncoming nurse.

## 2019-12-22 NOTE — Care Management (Signed)
Patient ID: Eric Barron, male   DOB: 04/04/1951, 69 y.o.   MRN: [id number] Able to make contact with wife this afternoon after several calls yesterday and unable to leave message as in box is full. Wife reports multiple medical issues and back issue that she is dealing with and recent "bad news". Acknowledged report by patient that her mother was ill as well as husband being in the hospital. Reports "that is just part of it". Reported she was expecting a call to note husband is ready for discharge and is appreciative of early notice of discharge at the end of the week. She is agreeable to attend fam education on Thursday afternoon with possible discharge Friday. Patient is anxious to go home "today" if possible; "I'm ready". Wife reported that husband walked all over/ 5 miles or so a day PTA and he will not be able to do that at discharge for a while. She has to care for him and her mother and herself. Information reviewed with the patient.

## 2019-12-22 NOTE — Progress Notes (Signed)
[place] PHYSICAL MEDICINE & REHABILITATION PROGRESS NOTE   Subjective/Complaints:  No C/os The pt knows he is in hospital but does not recognize me from yesterday. After cueing , pt did say "Doctor"  Systolic HTN   ROS: Patient denies CP, SOB, N/V/D  Objective:   No results found. Recent Labs    12/19/19 1724 12/21/19 0510  WBC 9.0 10.2  HGB 14.5 14.8  HCT 45.7 47.0  PLT 176 185   Recent Labs    12/19/19 1724 12/21/19 0510  NA  --  145  K  --  3.7  CL  --  108  CO2  --  27  GLUCOSE  --  115*  BUN  --  19  CREATININE 1.00 0.92  CALCIUM  --  9.1    Intake/Output Summary (Last 24 hours) at 12/22/2019 0733 Last data filed at 12/21/2019 1900 Gross per 24 hour  Intake 360 ml  Output --  Net 360 ml     Physical Exam: Vital Signs Blood pressure (!) 184/93, pulse 62, temperature 98.5 F (36.9 C), temperature source Oral, resp. rate 18, height 5\' 9"  (1.753 m), weight 78.6 kg, SpO2 98 %. Constitutional: No distress . Vital signs reviewed.   General: No acute distress Mood and affect are appropriate Heart: Regular rate and rhythm no rubs murmurs or extra sounds Lungs: Clear to auscultation, breathing unlabored, no rales or wheezes Abdomen: Positive bowel sounds, soft nontender to palpation, nondistended Extremities: No clubbing, cyanosis, or edema Skin: No evidence of breakdown, no evidence of rash  Musculoskeletal: Full range of motion in all 4 extremities. No joint swelling Fair insight and awareness. Delayed processing. STM deficits. RUE 5/5. RLE 5/5. LUE 4/5. LLE 4+/5. PD LUE, dysmetria LUE,LLE. Senses pain and LT equally right to left. Skin: Skin iswarmand dry. He isnot diaphoretic.       Assessment/Plan: 1. Functional deficits secondary to right thalamic infarct which require 3+ hours per day of interdisciplinary therapy in a comprehensive inpatient rehab setting.  Physiatrist is providing close team supervision and 24 hour management of active  medical problems listed below.  Physiatrist and rehab team continue to assess barriers to discharge/monitor patient progress toward functional and medical goals  Care Tool:  Bathing    Body parts bathed by patient: Right arm, Left arm, Chest, Abdomen, Front perineal area, Buttocks, Right upper leg, Left upper leg, Right lower leg, Left lower leg, Face         Bathing assist Assist Level: Contact Guard/Touching assist     Upper Body Dressing/Undressing Upper body dressing   What is the patient wearing?: Pull over shirt    Upper body assist Assist Level: Supervision/Verbal cueing    Lower Body Dressing/Undressing Lower body dressing      What is the patient wearing?: Pants     Lower body assist Assist for lower body dressing: Contact Guard/Touching assist     Toileting Toileting Toileting Activity did not occur (Clothing management and hygiene only): N/A (no void or bm)  Toileting assist Assist for toileting: Contact Guard/Touching assist     Transfers Chair/bed transfer  Transfers assist     Chair/bed transfer assist level: Supervision/Verbal cueing     Locomotion Ambulation   Ambulation assist      Assist level: Minimal Assistance - Patient > 75% Assistive device: No Device Max distance: >350 ft   Walk 10 feet activity   Assist     Assist level: Minimal Assistance - Patient > 75% Assistive device:  No Device   Walk 50 feet activity   Assist    Assist level: Minimal Assistance - Patient > 75% Assistive device: No Device    Walk 150 feet activity   Assist    Assist level: Minimal Assistance - Patient > 75% Assistive device: No Device    Walk 10 feet on uneven surface  activity   Assist     Assist level: Minimal Assistance - Patient > 75% Assistive device: Other (comment)(intermittent use of 1 rail)   Wheelchair     Assist Will patient use wheelchair at discharge?: No(patient is a functional ambulator)   Wheelchair  activity did not occur: N/A         Wheelchair 50 feet with 2 turns activity    Assist    Wheelchair 50 feet with 2 turns activity did not occur: N/A       Wheelchair 150 feet activity     Assist  Wheelchair 150 feet activity did not occur: N/A       Blood pressure (!) 184/93, pulse 62, temperature 98.5 F (36.9 C), temperature source Oral, resp. rate 18, height 5\' 9"  (1.753 m), weight 78.6 kg, SpO2 98 %.  Medical Problem List and Plan: 1.Left-sided weakness with gait deficitsecondary to right thalamic infarction onset 12/15/19 as well as history of CVA in the past with residual left-sided weakness -patient may shower -ELOS/Goals: 5-7 days, mod I to supervision goals  Team conf in am  2. Antithrombotics: -DVT/anticoagulation:Lovenox. -antiplatelet therapy: Aspirin 81 mg daily and Plavix 75 mg daily x3 weeks then aspirin alone(stop date 4/21) 3. Pain Management:Tylenol as needed 4. Mood:Provide emotional support -antipsychotic agents: N/A 5. Neuropsych: This patientiscapable of making decisions on hisown behalf. 6. Skin/Wound Care:Routine skin checks 7. Fluids/Electrolytes/Nutrition:Routine in and outs with follow-up chemistries ordered for Monday 8. Hyperlipidemia. Lipitor 9. Dysphagia. Dysphagia #3 nectar liquids. Follow-up speech therapy             -advance diet as tolerated             -encourage liquids             -BUN/Creat nl  10. Medical noncompliance. Counseling 11. HTN: pt on norvasc 10mg  at home  -bp's have been elevated thus far  -resume norvasc at 5mg  daily,increase to 10mg   Vitals:   12/21/19 1934 12/22/19 0536  BP: (!) 189/85 (!) 184/93  Pulse: 70 62  Resp: 18 18  Temp: 98.3 F (36.8 C) 98.5 F (36.9 C)  SpO2: 96% 98%    LOS: 3 days A FACE TO FACE EVALUATION WAS PERFORMED  Charlett Blake 12/22/2019, 7:33 AM

## 2019-12-22 NOTE — Progress Notes (Signed)
Patient noted in a recliner at the beginning of the shift. He asked to go to the restroom & walked with a cane & stand by assist to the restroom. No c/o pain or discomfort. His blood pressure was elevated again this shift. Norvasc was increased to 10mg  this morning. No c/o dizziness or headache. He is a little unsteady with standing. Did not have to cue him to hold the handrails or to get his cane when he finished. He is slow to respond but answers appropriately. No acute distress noted. Will continue to monitor

## 2019-12-22 NOTE — Progress Notes (Signed)
Speech Language Pathology Daily Session Note  Patient Details  Name: Eric Barron MRN: [id number] Date of Birth: 12-07-1950  Today's Date: 12/22/2019 SLP Individual Time: 2284-0698 SLP Individual Time Calculation (min): 57 min  Short Term Goals: Week 1: SLP Short Term Goal 1 (Week 1): STG=LTG due to ELOS  Skilled Therapeutic Interventions: Pt was seen for skilled ST targeting dysphagia and cognition. SLP provided upgraded thin H2O trials in compliance with water protocol. Pt completed oral care at sink and then accepted ~4oz thin H2O from cup without overt s/sx aspiration and vocal quality remained clear throughout intake. Recommend repeat MBSS 4/7 at 0900 to reassess oropharyngeal swallow function and potential for liquid advancement. Recommend continue current diet for now.  Pt recalled 1/4 medication functions when provided with name. SLP introduced chunking strategy to assist with recall of total number of meds and general functions. Pt completed basic money management tasks from ALFA with only 2 verbal cues for error awareness and an initial verbal and visual cue for organization and problem solving when totaling money of various types (bills and different coins). When pt implemented verbal problem solving prior to manipulating coins, accuracy increased. Pt required Min A verbal cues to interpret information from medication labels within the ALFA, and Mod A to translate that information to a basic QID pill chart task (also from ALFA). Pt unable to interpret daily therapy schedule in current format, therefore SLP assisted by rewriting schedule in simplified form, which pt used to verbalize future therapy appointments with Min A verbal/visual cues.  Pt left sitting in recliner with seatbelt alarm in place, call bell in lap, needs met to his satisfaction. Continue per current plan of care.        Pain Pain Assessment Pain Scale: 0-10 Pain Score: 0-No pain  Therapy/Group: Individual  Therapy  Arbutus Leas 12/22/2019, 7:09 AM

## 2019-12-23 ENCOUNTER — Inpatient Hospital Stay (HOSPITAL_COMMUNITY): Payer: Medicare Other | Admitting: Physical Therapy

## 2019-12-23 ENCOUNTER — Encounter (HOSPITAL_COMMUNITY): Payer: Medicare Other | Admitting: Speech Pathology

## 2019-12-23 ENCOUNTER — Inpatient Hospital Stay (HOSPITAL_COMMUNITY): Payer: Medicare Other | Admitting: Occupational Therapy

## 2019-12-23 ENCOUNTER — Inpatient Hospital Stay (HOSPITAL_COMMUNITY): Payer: Medicare Other

## 2019-12-23 NOTE — Progress Notes (Signed)
Speech Language Pathology Discharge Summary  Patient Details  Name: Eric Barron MRN: [id number] Date of Birth: 24-Feb-1951  Today's Date: 12/24/2019 SLP Individual Time: 5974-7185 SLP Individual Time Calculation (min): 29 min   Skilled Therapeutic Interventions:  Pt was seen for skilled ST targeting education with pt and his wife. Verbal review and handouts provided regarding pt's current diet recommendation (dysphagia 3/thins) and strategies, as well as compensatory memory strategies. Review of MBSS also covered, detailing pharyngeal impairments and supporting rationale for extra dry swallow strategy. Reinforced recommendation for 24/7 supervision and assistance with complex ADLs such as medication management. Emphasis on routines and external aids for memory and orientation. Pt left sitting in recliner with seatbelt alarm in place and needs met, wife still present. Continue per current plan of care.       Patient has met 5 of 6 long term goals.  Patient to discharge at overall Modified Independent;Min;Mod level.  Reasons goals not met: Mod-Max A required for recall of new information   Clinical Impression/Discharge Summary:   Pt made functional gains and met 5 out of 6 long term goals this admission. Pt currently requires Min-Max assist for basic cognitive tasks and will require 24/7 supervision. Pt is Min A for basic problem solving and intellectual awareness of deficits during familiar tasks, however increased Mod-Max A required for recall of new information. Pt is consuming upgraded Dysphagia 3 (mechanical soft) diet with thin liquids and is Mod I for use of compensatory swallow strategies. Pt has demonstrated improved basic problem solving and can use aids to assist with recall with Mod assist during ST. Due to very short length of stay, interventions for speech intelligibility (due to dysarthria) were not targeted, however he would benefit from speech and language interventions in follow  up ST. Given dysphagia, dysarthria, and cognitive impairments deficits still present, recommend pt continue to receive skilled ST services upon discharge. Pt and family education is complete at this time.   Care Partner:  Caregiver Able to Provide Assistance: Yes  Type of Caregiver Assistance: Cognitive  Recommendation:  Home Health SLP;24 hour supervision/assistance  Rationale for SLP Follow Up: Maximize functional communication;Maximize swallowing safety;Maximize cognitive function and independence;Reduce caregiver burden   Equipment: none   Reasons for discharge: Discharged from hospital   Patient/Family Agrees with Progress Made and Goals Achieved: Yes    Arbutus Leas 12/24/2019, 7:15 AM

## 2019-12-23 NOTE — Progress Notes (Signed)
Physical Therapy Session Note  Patient Details  Name: Eric Barron MRN: [id number] Date of Birth: 03-22-1951  Today's Date: 12/23/2019 PT Individual Time: 1620-1700 PT Individual Time Calculation (min): 40 min   Short Term Goals: Week 1:  PT Short Term Goal 1 (Week 1): STG=LTG due to short ELOS.  Skilled Therapeutic Interventions/Progress Updates:   Pt received sitting in WC and agreeable to PT. Gait training through hospital and in simulated community environment of cement sidewalk near [facility] entrance with [facility] and supervision assist. Pt able to perform gait training for 532f, 60105fand 25021fetween rest breaks with only mild increase in R foot drag with fatigue; and he was AbeCarlyle Lipa correct with cues from PT. Dynamic balance training and force use of RUE on Dyn[employer] x 2 bouts with supervision assist and min cues for posture to improve shoulder ROM in the RUE.  Patient returned to room and performed ambulatory to recliner with supervision assist. Pt left sitting in recliner with call bell in reach and all needs met.         Therapy Documentation Precautions:  Precautions Precautions: Fall Restrictions Weight Bearing Restrictions: No Vital Signs: Therapy Vitals Temp: 98.8 F (37.1 C) Temp Source: Oral Pulse Rate: 70 Resp: 16 BP: (!) 157/70(RN notified ) Patient Position (if appropriate): Sitting Oxygen Therapy SpO2: 95 % O2 Device: Room Air Pain:   denies   Therapy/Group: Individual Therapy  AusLorie Phenix7/2021, 5:01 PM

## 2019-12-23 NOTE — Progress Notes (Signed)
Occupational Therapy Session Note  Patient Details  Name: [name] MRN: [id number] Date of Birth: 02-01-1951  Today's Date: 12/23/2019 OT Individual Time: 0705-0720 OT Individual Time Calculation (min): 15 min  and Today's Date: 12/23/2019 OT Missed Time: 38 Minutes Missed Time Reason: Patient fatigue   Short Term Goals: Week 1:  OT Short Term Goal 1 (Week 1): STGs=LTGs due to ELOS  Skilled Therapeutic Interventions/Progress Updates:    Upon entering the room, pt with covers pulled up close under neck and reports, " I'm really cold." OT checking temperature with results of 98.5 . Pt reports "just not feeling well" and requesting additional blankets. OT notified RN and pt declined further OT intervention. All needs within reach and bed alarm activated.   Therapy Documentation Precautions:  Precautions Precautions: Fall Restrictions Weight Bearing Restrictions: No General: General OT Amount of Missed Time: 45 Minutes Vital Signs: Therapy Vitals Temp: 98.4 F (36.9 C) Temp Source: Oral Pulse Rate: 73 Resp: 18 BP: (!) 165/72 Patient Position (if appropriate): Lying Oxygen Therapy SpO2: 97 % O2 Device: Room Air Pain:   ADL: ADL Eating: Set up Grooming: Contact guard Where Assessed-Grooming: Standing at sink Upper Body Bathing: Setup Where Assessed-Upper Body Bathing: Edge of bed Lower Body Bathing: Contact guard Where Assessed-Lower Body Bathing: Edge of bed Upper Body Dressing: Supervision/safety Where Assessed-Upper Body Dressing: Edge of bed Lower Body Dressing: Minimal assistance Where Assessed-Lower Body Dressing: Edge of bed Toileting: Not assessed Toilet Transfer: Therapist, music Method: Ambulating(RW) Gaffer Transfer: Minimal assistance Social research officer, government Method: Ambulating(without AD)   Therapy/Group: Individual Therapy  Gypsy Decant 12/23/2019, 7:45 AM

## 2019-12-23 NOTE — Progress Notes (Signed)
Physical Therapy Session Note  Patient Details  Name: Eric Barron MRN: [id number] Date of Birth: Jan 18, 1951  Today's Date: 12/23/2019 PT Individual Time: 1130-1200 PT Individual Time Calculation (min): 30 min   Short Term Goals: Week 1:  PT Short Term Goal 1 (Week 1): STG=LTG due to short ELOS.  Skilled Therapeutic Interventions/Progress Updates: Pt presented in bed agreeable to therapy. Pt denies pain during session. BP assessed prior to mobility 180/85 (110), discussed with nsg agreeable to proceed with tx and to monitor BP. Pt ambulated to rehab gym with CGA and SPC. Pt participated in dynamic balance and coordination with use of agility ladder. Pt performed forward steps, side stepping, and zig zags leading with both L and R. Pt required increased cues and facilitation with forward/backward motion vs sidestepping. BP checked after activity 185/87 (115). After brief rest pt ambulated back to room with [facility] and close S. Pt agreeable to sit in recliner at lunch arriving soon. BP checked once pt in chair 169/82 (105). Pt left in recliner at end of session with belt alarm on, call bell within reach and needs met.      Therapy Documentation Precautions:  Precautions Precautions: Fall Restrictions Weight Bearing Restrictions: No General:   Vital Signs:  Pain:   Mobility:   Locomotion :    Trunk/Postural Assessment :    Balance:   Exercises:   Other Treatments:      Therapy/Group: Individual Therapy  Kanaan Kagawa 12/23/2019, 12:16 PM

## 2019-12-23 NOTE — Discharge Summary (Signed)
Physician Discharge Summary  Patient ID: Eric Barron MRN: [id number] DOB/AGE: 20-May-1951 69 y.o.  Admit date: 12/19/2019 Discharge date: 12/25/2019  Discharge Diagnoses:  Principal Problem:   Right thalamic infarction [facility]) DVT prophylaxis Hyperlipidemia Dysphagia Hypertension Medical noncompliance  Discharged Condition: Stable  Significant Diagnostic Studies: CT ANGIO HEAD W OR WO CONTRAST  Result Date: 12/16/2019 CLINICAL DATA:  Stroke follow-up EXAM: CT ANGIOGRAPHY HEAD AND NECK TECHNIQUE: Multidetector CT imaging of the head and neck was performed using the standard protocol during bolus administration of intravenous contrast. Multiplanar CT image reconstructions and MIPs were obtained to evaluate the vascular anatomy. Carotid stenosis measurements (when applicable) are obtained utilizing NASCET criteria, using the distal internal carotid diameter as the denominator. CONTRAST:  83mL OMNIPAQUE IOHEXOL 350 MG/ML SOLN COMPARISON:  None. FINDINGS: CTA NECK FINDINGS SKELETON: There is no bony spinal canal stenosis. No lytic or blastic lesion. OTHER NECK: Normal pharynx, larynx and major salivary glands. No cervical lymphadenopathy. Unremarkable thyroid gland. UPPER CHEST: No pneumothorax or pleural effusion. No nodules or masses. AORTIC ARCH: There is mild calcific atherosclerosis of the aortic arch. There is no aneurysm, dissection or hemodynamically significant stenosis of the visualized portion of the aorta. Conventional 3 vessel aortic branching pattern. The visualized proximal subclavian arteries are widely patent. RIGHT CAROTID SYSTEM: No dissection, occlusion or aneurysm. There is mixed density atherosclerosis extending into the proximal ICA, resulting in less than 50% stenosis. LEFT CAROTID SYSTEM: No dissection, occlusion or aneurysm. There is mixed density atherosclerosis extending into the proximal ICA, resulting in less than 50% stenosis. VERTEBRAL ARTERIES: Left dominant  configuration. Both origins are clearly patent. There is no dissection, occlusion or flow-limiting stenosis to the skull base (V1-V3 segments). CTA HEAD FINDINGS POSTERIOR CIRCULATION: --Vertebral arteries: Normal V4 segments. --Posterior inferior cerebellar arteries (PICA): Patent origins from the vertebral arteries. --Anterior inferior cerebellar arteries (AICA): Patent origins from the basilar artery. --Basilar artery: Normal. --Superior cerebellar arteries: Normal. --Posterior cerebral arteries: Normal. Both originate from the basilar artery. Posterior communicating arteries (p-comm) are diminutive or absent. ANTERIOR CIRCULATION: --Intracranial internal carotid arteries: Normal. --Anterior cerebral arteries (ACA): Normal. Both A1 segments are present. Patent anterior communicating artery (a-comm). --Middle cerebral arteries (MCA): Normal. VENOUS SINUSES: As permitted by contrast timing, patent. ANATOMIC VARIANTS: None Review of the MIP images confirms the above findings. IMPRESSION: 1. No emergent large vessel occlusion or hemodynamically significant stenosis of the head or neck. 2. Bilateral proximal ICA atherosclerosis with less than 50% stenosis. Electronically Signed   By: Ulyses Jarred M.D.   On: 12/16/2019 19:38   CT ANGIO NECK W OR WO CONTRAST  Result Date: 12/16/2019 CLINICAL DATA:  Stroke follow-up EXAM: CT ANGIOGRAPHY HEAD AND NECK TECHNIQUE: Multidetector CT imaging of the head and neck was performed using the standard protocol during bolus administration of intravenous contrast. Multiplanar CT image reconstructions and MIPs were obtained to evaluate the vascular anatomy. Carotid stenosis measurements (when applicable) are obtained utilizing NASCET criteria, using the distal internal carotid diameter as the denominator. CONTRAST:  60mL OMNIPAQUE IOHEXOL 350 MG/ML SOLN COMPARISON:  None. FINDINGS: CTA NECK FINDINGS SKELETON: There is no bony spinal canal stenosis. No lytic or blastic lesion. OTHER  NECK: Normal pharynx, larynx and major salivary glands. No cervical lymphadenopathy. Unremarkable thyroid gland. UPPER CHEST: No pneumothorax or pleural effusion. No nodules or masses. AORTIC ARCH: There is mild calcific atherosclerosis of the aortic arch. There is no aneurysm, dissection or hemodynamically significant stenosis of the visualized portion of the aorta. Conventional 3 vessel aortic branching  pattern. The visualized proximal subclavian arteries are widely patent. RIGHT CAROTID SYSTEM: No dissection, occlusion or aneurysm. There is mixed density atherosclerosis extending into the proximal ICA, resulting in less than 50% stenosis. LEFT CAROTID SYSTEM: No dissection, occlusion or aneurysm. There is mixed density atherosclerosis extending into the proximal ICA, resulting in less than 50% stenosis. VERTEBRAL ARTERIES: Left dominant configuration. Both origins are clearly patent. There is no dissection, occlusion or flow-limiting stenosis to the skull base (V1-V3 segments). CTA HEAD FINDINGS POSTERIOR CIRCULATION: --Vertebral arteries: Normal V4 segments. --Posterior inferior cerebellar arteries (PICA): Patent origins from the vertebral arteries. --Anterior inferior cerebellar arteries (AICA): Patent origins from the basilar artery. --Basilar artery: Normal. --Superior cerebellar arteries: Normal. --Posterior cerebral arteries: Normal. Both originate from the basilar artery. Posterior communicating arteries (p-comm) are diminutive or absent. ANTERIOR CIRCULATION: --Intracranial internal carotid arteries: Normal. --Anterior cerebral arteries (ACA): Normal. Both A1 segments are present. Patent anterior communicating artery (a-comm). --Middle cerebral arteries (MCA): Normal. VENOUS SINUSES: As permitted by contrast timing, patent. ANATOMIC VARIANTS: None Review of the MIP images confirms the above findings. IMPRESSION: 1. No emergent large vessel occlusion or hemodynamically significant stenosis of the head or  neck. 2. Bilateral proximal ICA atherosclerosis with less than 50% stenosis. Electronically Signed   By: Ulyses Jarred M.D.   On: 12/16/2019 19:38   MR BRAIN WO CONTRAST  Result Date: 12/15/2019 CLINICAL DATA:  Slurred speech, code stroke follow-up EXAM: MRI HEAD WITHOUT CONTRAST TECHNIQUE: Multiplanar, multiecho pulse sequences of the brain and surrounding structures were obtained without intravenous contrast. COMPARISON:  2018 FINDINGS: Brain: There is a small subcentimeter focus of reduced diffusion in the region of the posterior right internal capsule/lateral thalamus. Patchy and confluent areas of T2 hyperintensity in the supratentorial and pontine white matter are nonspecific but may reflect moderate chronic microvascular ischemic changes. There are chronic small vessel infarcts of the right putamen, left thalamus, and left subinsular white matter. No evidence of intracranial hemorrhage. There is no intracranial mass, mass effect, or edema. There is no hydrocephalus or extra-axial fluid collection. Vascular: Major vessel flow voids at the skull base are preserved. Skull and upper cervical spine: Normal marrow signal is preserved. Sinuses/Orbits: Minor mucosal thickening.  Orbits are unremarkable. Other: Sella is unremarkable.  Mastoid air cells are clear. IMPRESSION: Small acute infarct of the right thalamocapsular region. Chronic microvascular ischemic changes and chronic small vessel infarcts detailed above. Electronically Signed   By: Macy Mis M.D.   On: 12/15/2019 14:50   DG Swallowing Func-Speech Pathology  Result Date: 12/23/2019 Objective Swallowing Evaluation: Type of Study: MBS-Modified Barium Swallow Study  Patient Details Name: Eric Barron MRN: [id number] Date of Birth: November 18, 1950 Today's Date: 12/23/2019 Past Medical History: Past Medical History: Diagnosis Date . 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 CVA, old, dysarthria  . HTN (hypertension)   Past Surgical History: No past surgical history on file. HPI: 69 yo male adm to [facility] with confusion, disorientation.  Pt has h/o pontine CVA (left) and thalamic cva in 2003.  He is LEFT HANDED.  Pt also with h/o tobacco use.  Imaging study showed right thalamocapsular CVA.  Pt did not pass the [facility] screen and SLP was ordered.  MBSS 12/16/19 recc Dys 3 nectar thick liquids. Pt admitted to [facility] 12/19/19.  Assessment / Plan / Recommendation CHL IP CLINICAL IMPRESSIONS 12/23/2019 Clinical Impression Pt's presents with mild pharyngeal dysphagia with improved ability to protect his airway with thin liquids today  in comparison to previous MBSS on 12/16/19. Pt's cervical spine appears abnormal, in that it protrudes into pharyngeal space resulting in decreased eppiglottic inversion and overall subjectively smaller pharyngeal space than would be expected. Despite incomplete airway closure, no aspiration was observed throughout thin barium trials vial cup and straw, or when consuming thin with barium tablet. Shallow penetration of thin barium above the level of the vocal folds observed in <25% trials, and all penetrates were ejected during the swallow. Due to incomplete inversion of epiglottis, pt with intermittent and minimal residue in vallecular space and coating the underside of the epiglottis, which pt intermittently sensed and spontaneously triggered volitional dry swallow which assisted with clearance of residue. Given improvements in airway protection as well as increase in mobility since admission to [facility] and on clinical s/sx respiratory distress, recommend pt upgrade to thin liquids and use extra dry swallow to ensure clearance of any pharyngeal residue. Continue Dysphagia 3 (mech soft) solids, medications may be administered whole with thins or applesauce. ST will continue to follow to provide skilled interventions to ensure diet safety and efficiency as well as additional education regarding dysphagia while inpatient. SLP  Visit Diagnosis Dysphagia, pharyngeal phase (R13.13) Attention and concentration deficit following -- Frontal lobe and executive function deficit following -- Impact on safety and function Mild aspiration risk   CHL IP TREATMENT RECOMMENDATION 12/16/2019 Treatment Recommendations Therapy as outlined in treatment plan below   Prognosis 12/16/2019 Prognosis for Safe Diet Advancement Fair Barriers to Reach Goals Time post onset Barriers/Prognosis Comment -- CHL IP DIET RECOMMENDATION 12/23/2019 SLP Diet Recommendations Dysphagia 3 (Mech soft) solids;Thin liquid Liquid Administration via Cup;Straw Medication Administration Whole meds with liquid Compensations Slow rate;Small sips/bites;Multiple dry swallows after each bite/sip Postural Changes Remain semi-upright after after feeds/meals (Comment);Seated upright at 90 degrees   CHL IP OTHER RECOMMENDATIONS 12/23/2019 Recommended Consults -- Oral Care Recommendations Oral care BID Other Recommendations --   CHL IP FOLLOW UP RECOMMENDATIONS 12/23/2019 Follow up Recommendations Home health SLP;24 hour supervision/assistance   CHL IP FREQUENCY AND DURATION 12/16/2019 Speech Therapy Frequency (ACUTE ONLY) min 1 x/week Treatment Duration 1 week      CHL IP ORAL PHASE 12/23/2019 Oral Phase WFL Oral - Pudding Teaspoon -- Oral - Pudding Cup -- Oral - Honey Teaspoon -- Oral - Honey Cup -- Oral - Nectar Teaspoon -- Oral - Nectar Cup -- Oral - Nectar Straw -- Oral - Thin Teaspoon -- Oral - Thin Cup -- Oral - Thin Straw -- Oral - Puree -- Oral - Mech Soft -- Oral - Regular -- Oral - Multi-Consistency -- Oral - Pill -- Oral Phase - Comment --  CHL IP PHARYNGEAL PHASE 12/23/2019 Pharyngeal Phase Impaired Pharyngeal- Pudding Teaspoon -- Pharyngeal -- Pharyngeal- Pudding Cup -- Pharyngeal -- Pharyngeal- Honey Teaspoon -- Pharyngeal -- Pharyngeal- Honey Cup -- Pharyngeal -- Pharyngeal- Nectar Teaspoon -- Pharyngeal -- Pharyngeal- Nectar Cup NT Pharyngeal -- Pharyngeal- Nectar Straw NT Pharyngeal --  Pharyngeal- Thin Teaspoon NT Pharyngeal -- Pharyngeal- Thin Cup Reduced epiglottic inversion;Penetration/Aspiration during swallow;Pharyngeal residue - valleculae;Compensatory strategies attempted (with notebox);Reduced airway/laryngeal closure Pharyngeal Material enters airway, remains ABOVE vocal cords then ejected out Pharyngeal- Thin Straw Reduced epiglottic inversion;Reduced airway/laryngeal closure;Pharyngeal residue - valleculae;Penetration/Aspiration during swallow Pharyngeal Material enters airway, remains ABOVE vocal cords then ejected out Pharyngeal- Puree NT Pharyngeal -- Pharyngeal- Mechanical Soft NT Pharyngeal -- Pharyngeal- Regular -- Pharyngeal -- Pharyngeal- Multi-consistency -- Pharyngeal -- Pharyngeal- Pill WFL Pharyngeal Material does not enter airway Pharyngeal Comment --  CHL IP CERVICAL ESOPHAGEAL  PHASE 12/23/2019 Cervical Esophageal Phase WFL Pudding Teaspoon -- Pudding Cup -- Honey Teaspoon -- Honey Cup -- Nectar Teaspoon -- Nectar Cup -- Nectar Straw -- Thin Teaspoon -- Thin Cup -- Thin Straw -- Puree -- Mechanical Soft -- Regular -- Multi-consistency -- Pill -- Cervical Esophageal Comment -- Arbutus Leas 12/23/2019, 9:47 AM              DG Swallowing Func-Speech Pathology  Result Date: 12/16/2019 Objective Swallowing Evaluation: Type of Study: MBS-Modified Barium Swallow Study  Patient Details Name: Eric Barron MRN: [id number] Date of Birth: 02/13/1951 Today's Date: 12/16/2019 Time: SLP Start Time (ACUTE ONLY): 0900 -SLP Stop Time (ACUTE ONLY): 0925 SLP Time Calculation (min) (ACUTE ONLY): 25 min Past Medical History: No past medical history on file. Past Surgical History: HPI: 69 yo male adm to [facility] with confusion, disorientation.  Pt has h/o pontine CVA (left) and thalamic cva in 2003.  He is LEFT HANDED.  Pt also with h/o tobacco use.  Imaging study showed right thalamocapsular CVA.  Pt did not pass the [facility] screen and SLP was ordered.  SLP encountered pt awake and reporting hunger.   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  Cervical spine appears to impinge into pharynx/cervical esophagus preventing adequate epiglottic deflection as it contacts posterior pharyngeal wall thus contributing to pharyngeal retention.  Oral deficits with impaired oral bolus manipulation and decreased propulsion with oral residuals.  Pharyngeal swallow c/b decreased tongue base retraction, impaired laryngeal elevation/closure resulting in vallecular residuals and aspiration of thin (mostly silent - delayed cough with larger amount).  Chin tuck and head turn left/right did not prevent penetration or aspiration consistently nor did it decrease retention.  Reflexive dry swallows helpful to clear pharyngeal retention *secretions retained also.  Cued effortful swallow likely improved pharyngeal clearance due to improved muscle recruitment. Barium tablet swallowed with pudding halted in esophagus *appeared near aortic arch but radiologist not present to confirm*.  Of note, pt was not sensate to halted tablet.  Liquid swallows appeared to aid transit of tablet into esophagus.  Pt was educated to all findings/recommendations live with monitor and with written precaution sign using teach back for clinical reasoning.  Pt reports his swallow ability to be at baseline - stating he has coughed with intake for as long as she can recall.  However given this new cva event with decreased mobilitization, congested cough and h/o smoking - his aspiration pna risk will be higher.  Recommend dys3/nectar liquids and allow thin water between meals after oral care. SLP Visit Diagnosis Dysphagia, oropharyngeal phase (R13.12);Dysphagia, pharyngoesophageal phase (R13.14) Attention and concentration deficit following -- Frontal lobe and executive function deficit following --  Impact on safety and function Moderate aspiration risk   CHL IP TREATMENT RECOMMENDATION 12/16/2019 Treatment Recommendations Therapy as outlined in treatment plan below   Prognosis 12/16/2019 Prognosis for Safe Diet Advancement Fair Barriers to Reach Goals Time post onset Barriers/Prognosis Comment -- CHL IP DIET RECOMMENDATION 12/16/2019 SLP Diet Recommendations Dysphagia 3 (Mech soft) solids;Nectar thick liquid Liquid Administration via Cup;Straw Medication Administration Whole meds with puree Compensations Slow rate;Small sips/bites Postural Changes Remain semi-upright after after feeds/meals (Comment)   CHL IP OTHER RECOMMENDATIONS 12/16/2019 Recommended Consults -- Oral Care Recommendations Oral care BID Other Recommendations --   CHL IP FOLLOW UP RECOMMENDATIONS 12/16/2019 Follow up Recommendations Home health SLP   CHL IP FREQUENCY AND  DURATION 12/16/2019 Speech Therapy Frequency (ACUTE ONLY) min 1 x/week Treatment Duration 1 week      CHL IP ORAL PHASE 12/16/2019 Oral Phase Impaired Oral - Pudding Teaspoon -- Oral - Pudding Cup -- Oral - Honey Teaspoon -- Oral - Honey Cup -- Oral - Nectar Teaspoon Reduced posterior propulsion;Premature spillage;Weak lingual manipulation Oral - Nectar Cup Reduced posterior propulsion;Premature spillage;Weak lingual manipulation Oral - Nectar Straw Reduced posterior propulsion;Premature spillage;Weak lingual manipulation Oral - Thin Teaspoon Reduced posterior propulsion;Premature spillage;Weak lingual manipulation Oral - Thin Cup Reduced posterior propulsion;Premature spillage;Weak lingual manipulation Oral - Thin Straw Reduced posterior propulsion;Premature spillage;Weak lingual manipulation Oral - Puree Reduced posterior propulsion;Weak lingual manipulation;Premature spillage Oral - Mech Soft Reduced posterior propulsion;Weak lingual manipulation;Premature spillage Oral - Regular -- Oral - Multi-Consistency -- Oral - Pill Reduced posterior propulsion;Weak lingual  manipulation;Premature spillage Oral Phase - Comment --  CHL IP PHARYNGEAL PHASE 12/16/2019 Pharyngeal Phase Impaired Pharyngeal- Pudding Teaspoon -- Pharyngeal -- Pharyngeal- Pudding Cup -- Pharyngeal -- Pharyngeal- Honey Teaspoon -- Pharyngeal -- Pharyngeal- Honey Cup -- Pharyngeal -- Pharyngeal- Nectar Teaspoon -- Pharyngeal -- Pharyngeal- Nectar Cup Reduced epiglottic inversion;Pharyngeal residue - valleculae;Reduced tongue base retraction Pharyngeal Material does not enter airway Pharyngeal- Nectar Straw Pharyngeal residue - valleculae;Reduced tongue base retraction;Reduced epiglottic inversion Pharyngeal Material does not enter airway Pharyngeal- Thin Teaspoon Reduced laryngeal elevation;Reduced anterior laryngeal mobility;Reduced epiglottic inversion;Reduced airway/laryngeal closure;Penetration/Aspiration during swallow;Delayed swallow initiation-pyriform sinuses;Reduced tongue base retraction Pharyngeal Material enters airway, CONTACTS cords and not ejected out Pharyngeal- Thin Cup Delayed swallow initiation-pyriform sinuses;Penetration/Aspiration during swallow;Trace aspiration;Pharyngeal residue - valleculae;Reduced epiglottic inversion;Reduced tongue base retraction Pharyngeal Material enters airway, passes BELOW cords without attempt by patient to eject out (silent aspiration) Pharyngeal- Thin Straw Delayed swallow initiation-pyriform sinuses;Penetration/Aspiration during swallow;Trace aspiration;Moderate aspiration;Pharyngeal residue - valleculae;Reduced epiglottic inversion;Reduced tongue base retraction Pharyngeal -- Pharyngeal- Puree Reduced epiglottic inversion;Pharyngeal residue - valleculae;Reduced tongue base retraction Pharyngeal Material does not enter airway Pharyngeal- Mechanical Soft Reduced epiglottic inversion;Pharyngeal residue - valleculae;Reduced tongue base retraction Pharyngeal Material does not enter airway Pharyngeal- Regular -- Pharyngeal -- Pharyngeal- Multi-consistency --  Pharyngeal -- Pharyngeal- Pill Pharyngeal residue - valleculae;Reduced epiglottic inversion Pharyngeal Material does not enter airway Pharyngeal Comment various postures including head turn right/left with and without chin tuck were not helpful to prevent residuals not penetration/aspiration, cued cough did not clear deeper aspirates, aspiration was mild, anatomical abnormality *appeararance of cervical spine degenerative dx vs osteophytes protruding into pharynx diminished epiglottic deflection and contributed to residuals  CHL IP CERVICAL ESOPHAGEAL PHASE 12/16/2019 Cervical Esophageal Phase Impaired Pudding Teaspoon -- Pudding Cup -- Honey Teaspoon -- Honey Cup -- Nectar Teaspoon -- Nectar Cup -- Nectar Straw -- Thin Teaspoon -- Thin Cup -- Thin Straw -- Puree -- Mechanical Soft -- Regular -- Multi-consistency -- Pill -- Cervical Esophageal Comment Barium tablet swallowed with pudding halted in esophagus *appeared near aortic arch but radiologist not present to confirm*.  Of note, pt was not sensate to halted tablet.  Liquid swallows appeared to aid transit of tablet into esophagus Macario Golds 12/16/2019, 10:24 AM  Kathleen Lime, MS [facility] SLP Acute Rehab Services Office [phone]             ECHOCARDIOGRAM COMPLETE BUBBLE STUDY  Result Date: 12/16/2019    ECHOCARDIOGRAM REPORT   Patient Name:   Eric Barron Date of Exam: 12/16/2019 Medical Rec #:  [id number]        Height: Accession #:    [id number]       Weight: Date of Birth:  1951/02/04       BSA: Patient Age:    45 years         BP:           211/91 mmHg Patient Gender: M                HR:           57 bpm. Exam Location:  Inpatient Procedure: 2D Echo, Cardiac Doppler and Color Doppler Indications:    CVA  History:        Patient has no prior history of Echocardiogram examinations.                 Stroke; Risk Factors:Hypertension.  Sonographer:    Dustin Flock Referring Phys: [place]  1. Left ventricular ejection  fraction, by estimation, is 60 to 65%. The left ventricle has normal function. The left ventricle has no regional wall motion abnormalities. There is moderate concentric left ventricular hypertrophy. Left ventricular diastolic parameters are indeterminate.  2. Right ventricular systolic function is normal. The right ventricular size is normal.  3. The mitral valve is normal in structure. Trivial mitral valve regurgitation. No evidence of mitral stenosis.  4. The aortic valve is grossly normal. Aortic valve regurgitation is not visualized. No aortic stenosis is present.  5. The inferior vena cava is normal in size with <50% respiratory variability, suggesting right atrial pressure of 8 mmHg.  6. Agitated saline contrast bubble study was negative, with no evidence of any interatrial shunt. Comparison(s): No prior Echocardiogram. Conclusion(s)/Recommendation(s): No intracardiac source of embolism detected on this transthoracic study. A transesophageal echocardiogram is recommended to exclude cardiac source of embolism if clinically indicated. FINDINGS  Left Ventricle: Left ventricular ejection fraction, by estimation, is 60 to 65%. The left ventricle has normal function. The left ventricle has no regional wall motion abnormalities. The left ventricular internal cavity size was normal in size. There is  moderate concentric left ventricular hypertrophy. Left ventricular diastolic parameters are indeterminate. Right Ventricle: The right ventricular size is normal. No increase in right ventricular wall thickness. Right ventricular systolic function is normal. Left Atrium: Left atrial size was normal in size. Right Atrium: Right atrial size was normal in size. Pericardium: There is no evidence of pericardial effusion. Mitral Valve: The mitral valve is normal in structure. Trivial mitral valve regurgitation. No evidence of mitral valve stenosis. Tricuspid Valve: The tricuspid valve is normal in structure. Tricuspid valve  regurgitation is trivial. No evidence of tricuspid stenosis. Aortic Valve: The aortic valve is grossly normal. Aortic valve regurgitation is not visualized. No aortic stenosis is present. Pulmonic Valve: The pulmonic valve was not well visualized. Pulmonic valve regurgitation is not visualized. No evidence of pulmonic stenosis. Aorta: The aortic root, ascending aorta and aortic arch are all structurally normal, with no evidence of dilitation or obstruction. Pulmonary Artery: The pulmonary artery is not well seen. Venous: The inferior vena cava is normal in size with less than 50% respiratory variability, suggesting right atrial pressure of 8 mmHg. IAS/Shunts: No atrial level shunt detected by color flow Doppler. Agitated saline contrast was given intravenously to evaluate for intracardiac shunting. Agitated saline contrast bubble study was negative, with no evidence of any interatrial shunt.  LEFT VENTRICLE PLAX 2D LVIDd:         4.50 cm  Diastology LVIDs:         2.90 cm  LV e' lateral:   8.38 cm/s LV PW:  1.50 cm  LV E/e' lateral: 6.9 LV IVS:        1.50 cm  LV e' medial:    5.11 cm/s LVOT diam:     2.00 cm  LV E/e' medial:  11.4 LV SV:         68 LVOT Area:     3.14 cm  RIGHT VENTRICLE RV Basal diam:  2.90 cm RV S prime:     14.30 cm/s TAPSE (M-mode): 2.4 cm LEFT ATRIUM LA diam:        3.50 cm LA Vol (A2C):   47.4 ml LA Vol (A4C):   38.5 ml LA Biplane Vol: 46.8 ml  AORTIC VALVE LVOT Vmax:   105.00 cm/s LVOT Vmean:  66.500 cm/s LVOT VTI:    0.217 m  AORTA Ao Root diam: 2.90 cm MITRAL VALVE MV Area (PHT): 1.98 cm    SHUNTS MV Decel Time: 384 msec    Systemic VTI:  0.22 m MV E velocity: 58.20 cm/s  Systemic Diam: 2.00 cm MV A velocity: 88.30 cm/s MV E/A ratio:  0.66 Buford Dresser MD Electronically signed by Buford Dresser MD Signature Date/Time: 12/16/2019/4:24:11 PM    Final    CT HEAD CODE STROKE WO CONTRAST  Result Date: 12/15/2019 CLINICAL DATA:  Code stroke.  Slurred speech EXAM: CT  HEAD WITHOUT CONTRAST TECHNIQUE: Contiguous axial images were obtained from the base of the skull through the vertex without intravenous contrast. COMPARISON:  Correlation made with prior brain MRI from 2018 FINDINGS: Brain: There is no acute intracranial hemorrhage, mass effect, or edema. Gray-white differentiation is preserved. There are chronic small vessel infarcts of the left thalamus and right putamen. Additional patchy and confluent areas of hypoattenuation in the supratentorial white matter are nonspecific but probably reflect moderate chronic microvascular ischemic changes. Prominence of the ventricles and sulci reflects minor generalized parenchymal volume loss. There is no extra-axial fluid collection. Vascular: No hyperdense vessel. There is atherosclerotic calcification at the skull base. Skull: Unremarkable. Sinuses/Orbits: No acute abnormality. Other: Mastoid air cells are clear. ASPECTS ([place] Stroke Program Early CT Score) - Ganglionic level infarction (caudate, lentiform nuclei, internal capsule, insula, M1-M3 cortex): 7 - Supraganglionic infarction (M4-M6 cortex): 3 Total score (0-10 with 10 being normal): 10 IMPRESSION: No acute intracranial hemorrhage or evidence of acute infarction. ASPECT score is 10. Moderate chronic microvascular ischemic changes. Small chronic infarcts of left thalamus and right putamen. These results were communicated to Dr. Cheral Marker at 1:40 pmon 3/30/2021by text page via the [facility] messaging system. Electronically Signed   By: Macy Mis M.D.   On: 12/15/2019 13:43    Labs:  Basic Metabolic Panel: Recent Labs  Lab 12/19/19 1724 12/21/19 0510  NA  --  145  K  --  3.7  CL  --  108  CO2  --  27  GLUCOSE  --  115*  BUN  --  19  CREATININE 1.00 0.92  CALCIUM  --  9.1    CBC: Recent Labs  Lab 12/19/19 1724 12/21/19 0510  WBC 9.0 10.2  NEUTROABS  --  6.3  HGB 14.5 14.8  HCT 45.7 47.0  MCV 91.4 90.4  PLT 176 185    CBG: No results for  input(s): GLUCAP in the last 168 hours.  Family history.  Mother and father with hypertension as well as hyperlipidemia.  Denies any colon cancer esophageal cancer or rectal cancer  Brief HPI:   Eric Barron is a 69 y.o. 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  Patient currently not taking his medications he stopped approximately 1 year ago.  Per chart review lives with spouse independent with ADLs and still drives.  Presented 12/15/2019 with acute onset of gait unsteadiness.  Cranial CT scan negative.  Patient did not receive TPA.  MRI showed small acute infarct right thalamocapsular region.  CT angiogram head and neck no emergent large vessel occlusion.  Echocardiogram with ejection fraction 65% without emboli.  Admission chemistries unremarkable.  Maintained on aspirin and Plavix for 3 weeks for CVA prophylaxis then aspirin alone.  Subcutaneous Lovenox for DVT prophylaxis.  Mechanical soft diet nectar liquids.  Therapy evaluations completed and patient was admitted for a comprehensive rehab program   Hospital Course: Eric Barron was admitted to rehab 12/19/2019 for inpatient therapies to consist of PT, ST and OT at least three hours five days a week. Past admission physiatrist, therapy team and rehab RN have worked together to provide customized collaborative inpatient rehab.  Pertaining to patient's right thalamic infarction remained stable he would continue aspirin and Plavix x3 weeks then aspirin alone as well as follow-up neurology services.  Subcutaneous Lovenox for DVT prophylaxis no bleeding episodes.  He continued on Lipitor for hyperlipidemia.  Low-dose Norvasc was resumed for hypertension.  He did have a history of medical noncompliance received a full count regards to maintaining his medical regimen.  He was currently on a mechanical soft diet advanced to thin liquids.   Blood  pressures were monitored on TID basis and controlled  He/ is continent of bowel and bladder.  He/has made gains during rehab stay and is   He/ will continue to receive follow up therapies   after discharge  Rehab course: During patient's stay in rehab weekly team conferences were held to monitor patient's progress, set goals and discuss barriers to discharge. At admission, patient required.  Min mod assist 100 feet without assistive device, moderate assist stand pivot transfers, supervision sit to supine.  Minimal assist upper body bathing minimal assist lower body bathing minimal assist upper body dressing minimal assist lower body dressing  Physical exam.  Blood pressure 174/89 pulse 63 temperature 97.9 respirations 14 oxygen saturation 90% room air Constitutional alert oriented no acute distress HEENT Head.  Normocephalic and atraumatic Eyes.  Pupils round and reactive to light no discharge without nystagmus Neck.  Supple nontender no JVD without thyromegaly Cardiac.  Regular rate rhythm no extra sounds or murmur heard Respiratory effort normal no respiratory distress without wheeze GI.  Soft nontender positive bowel sounds without rebound Musculoskeletal.  No edema normal range of motion Neurological.  He is alert and oriented to person place and time no cranial nerve deficits fair insight and awareness some delay in processing with some short-term memory deficits. Right upper extremity 5 out of 5, right lower extremity 5 out of 5.  Left upper extremity 4 out of 5.  Left lower extremity 4+ out of 5.  Left PD and slight dysmetria of left upper left lower extremity. Skin.  Warm and dry  He/  has had improvement in activity tolerance, balance, postural control as well as ability to compensate for deficits. He/ has had improvement in functional use RUE/LUE  and RLE/LLE as well as improvement in awareness.  Patient ambulates to the rehab gym with contact-guard assist straight point cane.   Participated in dynamic balance and coordination with use of agility ladder.  Perform forward step  sidestepping and zigzags leading with both left and right.  He gather belongings for activities day living and homemaking.  Full family teaching was completed plan discharged home       Disposition: Discharge to home    Diet: Mechanical soft  Special Instructions: No driving smoking or alcohol  Aspirin 81 mg daily and Plavix 75 mg daily until 01/06/2020 then aspirin alone  Medications at discharge. 1.  Norvasc 10 mg p.o. daily 2.  Aspirin 81 mg p.o. daily 3.  Plavix 85 mg daily until 01/06/2020 and stop   Discharge Instructions    Ambulatory referral to Neurology   Complete by: As directed    An appointment is requested in approximately 4 weeks right thalamic infarction   Ambulatory referral to Physical Medicine Rehab   Complete by: As directed    Moderate complexity follow-up 1 to 2 weeks right thalamic infarction     Allergies as of 12/25/2019   No Known Allergies     Medication List    TAKE these medications   amLODipine 10 MG tablet Commonly known as: NORVASC Take 1 tablet (10 mg total) by mouth daily.   aspirin 81 MG chewable tablet Chew 1 tablet (81 mg total) by mouth daily.   atorvastatin 80 MG tablet Commonly known as: LIPITOR Take 1 tablet (80 mg total) by mouth daily at 6 PM.   clopidogrel 75 MG tablet Commonly known as: PLAVIX Take 1 tablet (75 mg total) by mouth daily.   lisinopril 2.5 MG tablet Commonly known as: ZESTRIL Take 1 tablet (2.5 mg total) by mouth daily.      Follow-up Information    Kirsteins, Luanna Salk, MD Follow up.   Specialty: Physical Medicine and Rehabilitation Why: Office to call for appointment Contact information: [place] [address] [phone]           Signed: Cathlyn Parsons 12/25/2019, 5:01 AM

## 2019-12-23 NOTE — Progress Notes (Signed)
Occupational Therapy Session Note  Patient Details  Name: Eric Barron MRN: [id number] Date of Birth: [birth year]  Today's Date: 12/23/2019 OT Individual Time: [id number] OT Individual Time Calculation (min): 56 min  C  Short Term Goals: Week 1:  OT Short Term Goal 1 (Week 1): STGs=LTGs due to ELOS  Skilled Therapeutic Interventions/Progress Updates:    Upon entering the room, pt seated in recliner chair awaiting OT arrival with no c/o pain. Pt requesting to shower. Pt obtaining clean clothing items from seated position in chair with supervision overall. Pt ambulating with SPC into bathroom with supervision and seated on TTB for bathing tasks. Pt bathing while seated with intermittent supervision for safety. Pt drying self while seated and then donning underwear and shirt before exiting the bathroom. Pt returning to sit in recliner chair to don LB clothing items with supervision overall. Pt ambulating to sink for grooming tasks while standing with close supervision. Pt then ambulating 100' to tub room and OT demonstrated transfer onto TTB in tub shower combo and pt returning with close supervision but reports having shower chair at home. Pt demonstrated stepping into tub over ledge with CGA for balance. Pt requesting to use shower chair at home with assistance from family member. Pt returning back to recliner chair with chair alarm belt donned and call bell within reach.   Therapy Documentation Precautions:  Precautions Precautions: Fall Restrictions Weight Bearing Restrictions: No General:   Vital Signs: Therapy Vitals Temp: 98.8 F (37.1 C) Temp Source: Oral Pulse Rate: 70 Resp: 16 BP: (!) 157/70(RN notified ) Patient Position (if appropriate): Sitting Oxygen Therapy SpO2: 95 % O2 Device: Room Air ADL: ADL Eating: Set up Grooming: Contact guard Where Assessed-Grooming: Standing at sink Upper Body Bathing: Setup Where Assessed-Upper Body Bathing: Edge of bed Lower Body  Bathing: Contact guard Where Assessed-Lower Body Bathing: Edge of bed Upper Body Dressing: Supervision/safety Where Assessed-Upper Body Dressing: Edge of bed Lower Body Dressing: Minimal assistance Where Assessed-Lower Body Dressing: Edge of bed Toileting: Not assessed Toilet Transfer: Therapist, music Method: Ambulating(RW) Gaffer Transfer: Minimal assistance Social research officer, government Method: Ambulating(without AD)   Therapy/Group: Individual Therapy  Gypsy Decant 12/23/2019, 2:42 PM

## 2019-12-23 NOTE — Patient Care Conference (Signed)
Inpatient RehabilitationTeam Conference and Plan of Care Update Date: 12/23/2019   Time: 10:10 AM    Patient Name: Eric Barron      Medical Record Number: [id number]  Date of Birth: 12/20/1950 Sex: Male         Room/Bed: 4M07C/4M07C-01 Payor Info: Payor: MEDICARE / Plan: MEDICARE PART A AND B / Product Type: *No Product type* /    Admit Date/Time:  12/19/2019  4:06 PM  Primary Diagnosis:  Right thalamic infarction [facility])  Patient Active Problem List   Diagnosis Date Noted  . Right thalamic infarction ([place]) 12/19/2019  . Essential hypertension 12/16/2019  . Hyperlipidemia 12/16/2019  . Prediabetes 12/16/2019  . CVA (cerebral vascular accident) ([place]) 12/15/2019    Expected Discharge Date: Expected Discharge Date: 12/25/19  Team Members Present: Physician leading conference: Dr. Alysia Penna Care Coodinator Present: Nestor Lewandowsky, RN, BSN, CRRN;Genie Masashi Snowdon, RN, MSN Nurse Present: Other (comment)(Daniel Russella Dar, RN) PT Present: Barrie Folk, PT OT Present: Darleen Crocker, OT SLP Present: Jettie Booze, CF-SLP PPS Coordinator present : Ileana Ladd, PT     Current Status/Progress Goal Weekly Team Focus  Bowel/Bladder   continent of bowel & bladder, LBM 4/6  remain continent  assist as needed & monitor   Swallow/Nutrition/ Hydration   Dys 3, thin liquids (upgraded after MBSS), full supervision due to fluctuations in cognition  Mod I  carryover swallow strategies, tolerance Dys 3/thin, education   ADL's   CGA ambulatory toilet transfer using RW, Min A ambulatory shower transfer without AD, CGA bathing sit<stand from EOB, Supervision UB dressing, Min A LB dressing, high BP concerns  Supervision/cuing  Dynamic balance, functional transfers, pt/family education   Mobility   independent bed mobilty, supervision transfers, supervision gait with RW, CGA gait with SPC  supervision gait, mod I transfers  higher balance, d/c planning   Communication             Safety/Cognition/  Behavioral Observations  Min A basic problem solving during basic familiar tasks, Mod-Max A recall  Min A  recall with aids/strategies, basic functional problem solving, intellectual awareness, education   Pain   no c/o pain, no pain meds ordered  remain pain free  assess & treat as needed   Skin   multiple areas of bruising, old scar to the left leg, no skin break down noted  no new areas of skin break down  assess q shift    Rehab Goals Patient on target to meet rehab goals: Yes *See Care Plan and progress notes for long and short-term goals.     Barriers to Discharge  Current Status/Progress Possible Resolutions Date Resolved   Nursing  Medication compliance  wife reports patient can be noncompliant with taking B/P meds at home            PT  Home environment access/layout;Behavior  5 STE with wide B rails (can only reach 1 at a time), decreased STM              OT Medical stability                SLP                SW Other (comments)(Wife has medical issues and back problems) 5 step entry to home Wife planning to take patient home; she does not work, able to provide transportation/supervision 24/7          Discharge Planning/Teaching Needs:  Home with wife  TBD; Toileting, transfers, medications,  etc   Team Discussion: MD poor memory, R thalamic infarct, previous CVA, BP up, monitoring BP, meds adjusted.  RN meds whole, cont B/B, bruising.  OT CGA toileting, transfers CGA no device, S UB self care, min A LB self care, S goals.  SLP MBS done, thin liquids, full S with meals, min A tasks, mod/max recall, min a goals.  Fam ed tomorrow.  Wife and GDtr to assist at DC.   Revisions to Treatment Plan: N/A     Medical Summary Current Status: Patient with confusion, poor balance, elevated blood pressure Weekly Focus/Goal: Adjust blood pressure medications, educated family  Barriers to Discharge: Medical stability   Possible Resolutions to Barriers: Family education, adjust blood  pressure medications   Continued Need for Acute Rehabilitation Level of Care: The patient requires daily medical management by a physician with specialized training in physical medicine and rehabilitation for the following reasons: Direction of a multidisciplinary physical rehabilitation program to maximize functional independence : Yes Medical management of patient stability for increased activity during participation in an intensive rehabilitation regime.: Yes Analysis of laboratory values and/or radiology reports with any subsequent need for medication adjustment and/or medical intervention. : Yes   I attest that I was present, lead the team conference, and concur with the assessment and plan of the team.   Jodell Cipro M 12/23/2019, 4:06 PM   Team conference was held via web/ teleconference due to [place] - 19

## 2019-12-23 NOTE — Progress Notes (Signed)
[place] PHYSICAL MEDICINE & REHABILITATION PROGRESS NOTE   Subjective/Complaints:  No issues overnite, remembers visit yesterday  No problems with bowel or bladder , no coughing with meals   ROS: Patient denies CP, SOB, N/V/D  Objective:   No results found. Recent Labs    12/21/19 0510  WBC 10.2  HGB 14.8  HCT 47.0  PLT 185   Recent Labs    12/21/19 0510  NA 145  K 3.7  CL 108  CO2 27  GLUCOSE 115*  BUN 19  CREATININE 0.92  CALCIUM 9.1    Intake/Output Summary (Last 24 hours) at 12/23/2019 0735 Last data filed at 12/23/2019 0017 Gross per 24 hour  Intake 680 ml  Output 350 ml  Net 330 ml     Physical Exam: Vital Signs Blood pressure (!) 165/72, pulse 73, temperature 98.4 F (36.9 C), temperature source Oral, resp. rate 18, height 5' 9"  (1.753 m), weight 78.6 kg, SpO2 97 %. Constitutional: No distress . Vital signs reviewed.   General: No acute distress Mood and affect are appropriate Heart: Regular rate and rhythm no rubs murmurs or extra sounds Lungs: Clear to auscultation, breathing unlabored, no rales or wheezes Abdomen: Positive bowel sounds, soft nontender to palpation, nondistended Extremities: No clubbing, cyanosis, or edema Skin: No evidence of breakdown, no evidence of rash  Musculoskeletal: Full range of motion in all 4 extremities. No joint swelling Fair insight and awareness. Delayed processing. STM deficits. RUE 5/5. RLE 5/5. LUE 4/5. LLE 4+/5. PD LUE, dysmetria LUE,LLE. Senses pain and LT equally right to left. Skin: Skin iswarmand dry. He isnot diaphoretic.       Assessment/Plan: 1. Functional deficits secondary to right thalamic infarct which require 3+ hours per day of interdisciplinary therapy in a comprehensive inpatient rehab setting.  Physiatrist is providing close team supervision and 24 hour management of active medical problems listed below.  Physiatrist and rehab team continue to assess barriers to discharge/monitor  patient progress toward functional and medical goals  Care Tool:  Bathing    Body parts bathed by patient: Right arm, Left arm, Chest, Abdomen, Front perineal area, Buttocks, Right upper leg, Left upper leg, Right lower leg, Left lower leg, Face         Bathing assist Assist Level: Contact Guard/Touching assist     Upper Body Dressing/Undressing Upper body dressing   What is the patient wearing?: Pull over shirt    Upper body assist Assist Level: Supervision/Verbal cueing    Lower Body Dressing/Undressing Lower body dressing      What is the patient wearing?: Pants     Lower body assist Assist for lower body dressing: Contact Guard/Touching assist     Toileting Toileting Toileting Activity did not occur (Clothing management and hygiene only): N/A (no void or bm)  Toileting assist Assist for toileting: Contact Guard/Touching assist     Transfers Chair/bed transfer  Transfers assist     Chair/bed transfer assist level: Supervision/Verbal cueing     Locomotion Ambulation   Ambulation assist      Assist level: Supervision/Verbal cueing Assistive device: Cane-straight Max distance: >332f   Walk 10 feet activity   Assist     Assist level: Contact Guard/Touching assist Assistive device: Cane-straight   Walk 50 feet activity   Assist    Assist level: Contact Guard/Touching assist Assistive device: No Device    Walk 150 feet activity   Assist    Assist level: Contact Guard/Touching assist Assistive device: No Device  Walk 10 feet on uneven surface  activity   Assist     Assist level: Contact Guard/Touching assist Assistive device: Cane-straight   Wheelchair     Assist Will patient use wheelchair at discharge?: No(patient is a functional ambulator)   Wheelchair activity did not occur: N/A         Wheelchair 50 feet with 2 turns activity    Assist    Wheelchair 50 feet with 2 turns activity did not occur:  N/A       Wheelchair 150 feet activity     Assist  Wheelchair 150 feet activity did not occur: N/A       Blood pressure (!) 165/72, pulse 73, temperature 98.4 F (36.9 C), temperature source Oral, resp. rate 18, height 5' 9"  (1.753 m), weight 78.6 kg, SpO2 97 %.  Medical Problem List and Plan: 1.Left-sided weakness with gait deficitsecondary to right thalamic infarction onset 12/15/19 as well as history of CVA in the past with residual left-sided weakness -patient may shower -ELOS/Goals: 5-7 days, mod I to supervision goals  Team conference today please see physician documentation under team conference tab, met with team  to discuss problems,progress, and goals. Formulized individual treatment plan based on medical history, underlying problem and comorbidities. 2. Antithrombotics: -DVT/anticoagulation:Lovenox. -antiplatelet therapy: Aspirin 81 mg daily and Plavix 75 mg daily x3 weeks then aspirin alone(stop date 4/21) 3. Pain Management:Tylenol as needed 4. Mood:Provide emotional support -antipsychotic agents: N/A 5. Neuropsych: This patientiscapable of making decisions on hisown behalf. 6. Skin/Wound Care:Routine skin checks 7. Fluids/Electrolytes/Nutrition:Routine in and outs with follow-up chemistries ordered for Monday 8. Hyperlipidemia. Lipitor 9. Dysphagia. Dysphagia #3 nectar liquids. Follow-up speech therapy- no signs of aspiration              -advance diet as tolerated             -encourage liquids             -BUN/Creat nl  10. Medical noncompliance. Counseling 11. HTN: pt on norvasc 8m at home  -bp's have been elevated thus far  -resume norvasc at 572mdaily,increase to 1053mn 4/6 Vitals:   12/22/19 1940 12/23/19 0508  BP: (!) 181/70 (!) 165/72  Pulse: 71 73  Resp: 17 18  Temp: 98.6 F (37 C) 98.4 F (36.9 C)  SpO2: 97% 97%    LOS: 4 days A FACE TO FACE EVALUATION WAS  PERFORMED  AndCharlett Blake7/2021, 7:35 AM

## 2019-12-23 NOTE — Progress Notes (Signed)
Modified Barium Swallow Progress Note  Patient Details  Name: Eric Barron MRN: [id number] Date of Birth: 19-Sep-1950  Today's Date: 12/23/2019  Modified Barium Swallow completed.  Full report located under Chart Review in the Imaging Section.  Brief recommendations include the following:  Clinical Impression   Pt's presents with mild pharyngeal dysphagia with improved ability to protect his airway with thin liquids today in comparison to previous MBSS on 12/16/19. Pt's cervical spine appears abnormal, in that it protrudes into pharyngeal space resulting in decreased eppiglottic inversion and overall subjectively smaller pharyngeal space than would be expected. Despite incomplete airway closure, no aspiration was observed throughout thin barium trials vial cup and straw, or when consuming thin with barium tablet. Shallow penetration of thin barium above the level of the vocal folds observed in <25% trials, and all penetrates were ejected during the swallow. Due to incomplete inversion of epiglottis, pt with intermittent and minimal residue in vallecular space and coating the underside of the epiglottis, which pt intermittently sensed and spontaneously triggered volitional dry swallow which assisted with clearance of residue. Given improvements in airway protection as well as increase in mobility since admission to CIR and on clinical s/sx respiratory distress, recommend pt upgrade to thin liquids and use extra dry swallow to ensure clearance of any pharyngeal residue. Continue Dysphagia 3 (mech soft) solids, medications may be administered whole with thins or applesauce. ST will continue to follow to provide skilled interventions to ensure diet safety and efficiency as well as additional education regarding dysphagia while inpatient.   Swallow Evaluation Recommendations       SLP Diet Recommendations: Dysphagia 3 (Mech soft) solids;Thin liquid   Liquid Administration via: Cup;Straw   Medication Administration: Whole meds with liquid   Supervision: Patient able to self feed   Compensations: Slow rate;Small sips/bites;Multiple dry swallows after each bite/sip   Postural Changes: Remain semi-upright after after feeds/meals (Comment);Seated upright at 90 degrees   Oral Care Recommendations: Oral care BID        Arbutus Leas 12/23/2019,9:46 AM

## 2019-12-24 ENCOUNTER — Ambulatory Visit (HOSPITAL_COMMUNITY): Payer: Medicare Other | Admitting: Physical Therapy

## 2019-12-24 ENCOUNTER — Inpatient Hospital Stay (HOSPITAL_COMMUNITY): Payer: Medicare Other | Admitting: Speech Pathology

## 2019-12-24 ENCOUNTER — Encounter (HOSPITAL_COMMUNITY): Payer: Medicare Other | Admitting: Speech Pathology

## 2019-12-24 ENCOUNTER — Inpatient Hospital Stay (HOSPITAL_COMMUNITY): Payer: Medicare Other | Admitting: Physical Therapy

## 2019-12-24 ENCOUNTER — Encounter (HOSPITAL_COMMUNITY): Payer: Medicare Other | Admitting: Occupational Therapy

## 2019-12-24 MED ORDER — ATORVASTATIN CALCIUM 80 MG PO TABS: 80.0000 mg | ORAL_TABLET | Freq: Every day | ORAL | 0 refills | Status: DC

## 2019-12-24 MED ORDER — LISINOPRIL 5 MG PO TABS
2.5000 mg | ORAL_TABLET | Freq: Every day | ORAL | Status: DC
  Administered 2019-12-24 – 2019-12-25 (×2): 2.5 mg via ORAL
  Filled 2019-12-24 (×2): qty 1

## 2019-12-24 MED ORDER — AMLODIPINE BESYLATE 10 MG PO TABS: 10.0000 mg | ORAL_TABLET | Freq: Every day | ORAL | 0 refills | Status: DC

## 2019-12-24 MED ORDER — LISINOPRIL 2.5 MG PO TABS: 2.5000 mg | ORAL_TABLET | Freq: Every day | ORAL | 0 refills | Status: DC

## 2019-12-24 MED ORDER — CLOPIDOGREL BISULFATE 75 MG PO TABS: 75.0000 mg | ORAL_TABLET | Freq: Every day | ORAL | 0 refills | Status: AC

## 2019-12-24 NOTE — Progress Notes (Signed)
Team Conference Report to Patient/Family  Team Conference discussion was reviewed with the patient and wife, including goals, any changes in plan of care and target discharge date.  Patient and wife expressed understanding and are in agreement.  The patient has a target discharge date of 12/25/19. HH follow up set up and family education set for 12/24/19 @ 1 p.m.  Dorien Chihuahua B 12/24/2019, 12:04 PM

## 2019-12-24 NOTE — Progress Notes (Signed)
[place] PHYSICAL MEDICINE & REHABILITATION PROGRESS NOTE   Subjective/Complaints: No complaints this morning. Had a good session of therapy this morning where he ambulated and worked on stairs. Felt he did well.   ROS: Patient denies CP, SOB, N/V/D  Objective:   DG Swallowing Func-Speech Pathology  Result Date: 12/23/2019 Objective Swallowing Evaluation: Type of Study: MBS-Modified Barium Swallow Study  Patient Details Name: NAETOCHUKWU MASTANDREA MRN: [id number] Date of Birth: 08/01/1951 Today's Date: 12/23/2019 Past Medical History: Past Medical History: Diagnosis Date . 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 CVA, old, dysarthria  . HTN (hypertension)  Past Surgical History: No past surgical history on file. HPI: 69 yo male adm to [facility] with confusion, disorientation.  Pt has h/o pontine CVA (left) and thalamic cva in 2003.  He is LEFT HANDED.  Pt also with h/o tobacco use.  Imaging study showed right thalamocapsular CVA.  Pt did not pass the [facility] screen and SLP was ordered.  MBSS 12/16/19 recc Dys 3 nectar thick liquids. Pt admitted to [facility] 12/19/19.  Assessment / Plan / Recommendation CHL IP CLINICAL IMPRESSIONS 12/23/2019 Clinical Impression Pt's presents with mild pharyngeal dysphagia with improved ability to protect his airway with thin liquids today in comparison to previous MBSS on 12/16/19. Pt's cervical spine appears abnormal, in that it protrudes into pharyngeal space resulting in decreased eppiglottic inversion and overall subjectively smaller pharyngeal space than would be expected. Despite incomplete airway closure, no aspiration was observed throughout thin barium trials vial cup and straw, or when consuming thin with barium tablet. Shallow penetration of thin barium above the level of the vocal folds observed in <25% trials, and all penetrates were ejected during the swallow. Due to incomplete inversion of epiglottis, pt with intermittent and minimal residue in  vallecular space and coating the underside of the epiglottis, which pt intermittently sensed and spontaneously triggered volitional dry swallow which assisted with clearance of residue. Given improvements in airway protection as well as increase in mobility since admission to CIR and on clinical s/sx respiratory distress, recommend pt upgrade to thin liquids and use extra dry swallow to ensure clearance of any pharyngeal residue. Continue Dysphagia 3 (mech soft) solids, medications may be administered whole with thins or applesauce. ST will continue to follow to provide skilled interventions to ensure diet safety and efficiency as well as additional education regarding dysphagia while inpatient. SLP Visit Diagnosis Dysphagia, pharyngeal phase (R13.13) Attention and concentration deficit following -- Frontal lobe and executive function deficit following -- Impact on safety and function Mild aspiration risk   CHL IP TREATMENT RECOMMENDATION 12/16/2019 Treatment Recommendations Therapy as outlined in treatment plan below   Prognosis 12/16/2019 Prognosis for Safe Diet Advancement Fair Barriers to Reach Goals Time post onset Barriers/Prognosis Comment -- CHL IP DIET RECOMMENDATION 12/23/2019 SLP Diet Recommendations Dysphagia 3 (Mech soft) solids;Thin liquid Liquid Administration via Cup;Straw Medication Administration Whole meds with liquid Compensations Slow rate;Small sips/bites;Multiple dry swallows after each bite/sip Postural Changes Remain semi-upright after after feeds/meals (Comment);Seated upright at 90 degrees   CHL IP OTHER RECOMMENDATIONS 12/23/2019 Recommended Consults -- Oral Care Recommendations Oral care BID Other Recommendations --   CHL IP FOLLOW UP RECOMMENDATIONS 12/23/2019 Follow up Recommendations Home health SLP;24 hour supervision/assistance   CHL IP FREQUENCY AND DURATION 12/16/2019 Speech Therapy Frequency (ACUTE ONLY) min 1 x/week Treatment Duration 1 week      CHL IP ORAL PHASE 12/23/2019 Oral Phase WFL  Oral - Pudding Teaspoon --  Oral - Pudding Cup -- Oral - Honey Teaspoon -- Oral - Honey Cup -- Oral - Nectar Teaspoon -- Oral - Nectar Cup -- Oral - Nectar Straw -- Oral - Thin Teaspoon -- Oral - Thin Cup -- Oral - Thin Straw -- Oral - Puree -- Oral - Mech Soft -- Oral - Regular -- Oral - Multi-Consistency -- Oral - Pill -- Oral Phase - Comment --  CHL IP PHARYNGEAL PHASE 12/23/2019 Pharyngeal Phase Impaired Pharyngeal- Pudding Teaspoon -- Pharyngeal -- Pharyngeal- Pudding Cup -- Pharyngeal -- Pharyngeal- Honey Teaspoon -- Pharyngeal -- Pharyngeal- Honey Cup -- Pharyngeal -- Pharyngeal- Nectar Teaspoon -- Pharyngeal -- Pharyngeal- Nectar Cup NT Pharyngeal -- Pharyngeal- Nectar Straw NT Pharyngeal -- Pharyngeal- Thin Teaspoon NT Pharyngeal -- Pharyngeal- Thin Cup Reduced epiglottic inversion;Penetration/Aspiration during swallow;Pharyngeal residue - valleculae;Compensatory strategies attempted (with notebox);Reduced airway/laryngeal closure Pharyngeal Material enters airway, remains ABOVE vocal cords then ejected out Pharyngeal- Thin Straw Reduced epiglottic inversion;Reduced airway/laryngeal closure;Pharyngeal residue - valleculae;Penetration/Aspiration during swallow Pharyngeal Material enters airway, remains ABOVE vocal cords then ejected out Pharyngeal- Puree NT Pharyngeal -- Pharyngeal- Mechanical Soft NT Pharyngeal -- Pharyngeal- Regular -- Pharyngeal -- Pharyngeal- Multi-consistency -- Pharyngeal -- Pharyngeal- Pill WFL Pharyngeal Material does not enter airway Pharyngeal Comment --  CHL IP CERVICAL ESOPHAGEAL PHASE 12/23/2019 Cervical Esophageal Phase WFL Pudding Teaspoon -- Pudding Cup -- Honey Teaspoon -- Honey Cup -- Nectar Teaspoon -- Nectar Cup -- Nectar Straw -- Thin Teaspoon -- Thin Cup -- Thin Straw -- Puree -- Mechanical Soft -- Regular -- Multi-consistency -- Pill -- Cervical Esophageal Comment -- Arbutus Leas 12/23/2019, 9:47 AM              No results for input(s): WBC, HGB, HCT, PLT in the last 72  hours. No results for input(s): NA, K, CL, CO2, GLUCOSE, BUN, CREATININE, CALCIUM in the last 72 hours.  Intake/Output Summary (Last 24 hours) at 12/24/2019 0914 Last data filed at 12/24/2019 0830 Gross per 24 hour  Intake 480 ml  Output --  Net 480 ml     Physical Exam: Vital Signs Blood pressure (!) 187/88, pulse 60, temperature 97.8 F (36.6 C), temperature source Oral, resp. rate 18, height 5\' 9"  (1.753 m), weight 78.6 kg, SpO2 98 %. Constitutional: No distress . Vital signs reviewed. Sitting up in chair comfortably.  General: No acute distress Mood and affect are appropriate Heart: Regular rate and rhythm no rubs murmurs or extra sounds Lungs: Clear to auscultation, breathing unlabored, no rales or wheezes Abdomen: Positive bowel sounds, soft nontender to palpation, nondistended Extremities: No clubbing, cyanosis, or edema Skin: No evidence of breakdown, no evidence of rash Musculoskeletal: Full range of motion in all 4 extremities. No joint swelling Fair insight and awareness. Delayed processing. STM deficits. RUE 5/5. RLE 5/5. LUE 4/5. LLE 4+/5. PD LUE, dysmetria LUE,LLE. Senses pain and LT equally right to left. Skin: Skin iswarmand dry. He isnot diaphoretic.    Assessment/Plan: 1. Functional deficits secondary to right thalamic infarct which require 3+ hours per day of interdisciplinary therapy in a comprehensive inpatient rehab setting.  Physiatrist is providing close team supervision and 24 hour management of active medical problems listed below.  Physiatrist and rehab team continue to assess barriers to discharge/monitor patient progress toward functional and medical goals  Care Tool:  Bathing    Body parts bathed by patient: Right arm, Left arm, Chest, Abdomen, Front perineal area, Buttocks, Right upper leg, Left upper leg, Right lower leg, Left lower leg, Face  Bathing assist Assist Level: Supervision/Verbal cueing     Upper Body  Dressing/Undressing Upper body dressing   What is the patient wearing?: Pull over shirt    Upper body assist Assist Level: Supervision/Verbal cueing    Lower Body Dressing/Undressing Lower body dressing      What is the patient wearing?: Pants     Lower body assist Assist for lower body dressing: Supervision/Verbal cueing     Toileting Toileting Toileting Activity did not occur (Clothing management and hygiene only): N/A (no void or bm)  Toileting assist Assist for toileting: Contact Guard/Touching assist     Transfers Chair/bed transfer  Transfers assist     Chair/bed transfer assist level: Supervision/Verbal cueing     Locomotion Ambulation   Ambulation assist      Assist level: Supervision/Verbal cueing Assistive device: Cane-straight Max distance: >38ft   Walk 10 feet activity   Assist     Assist level: Contact Guard/Touching assist Assistive device: Cane-straight   Walk 50 feet activity   Assist    Assist level: Contact Guard/Touching assist Assistive device: No Device    Walk 150 feet activity   Assist    Assist level: Contact Guard/Touching assist Assistive device: No Device    Walk 10 feet on uneven surface  activity   Assist     Assist level: Contact Guard/Touching assist Assistive device: Cane-straight   Wheelchair     Assist Will patient use wheelchair at discharge?: No(patient is a functional ambulator)   Wheelchair activity did not occur: N/A         Wheelchair 50 feet with 2 turns activity    Assist    Wheelchair 50 feet with 2 turns activity did not occur: N/A       Wheelchair 150 feet activity     Assist  Wheelchair 150 feet activity did not occur: N/A       Blood pressure (!) 187/88, pulse 60, temperature 97.8 F (36.6 C), temperature source Oral, resp. rate 18, height 5\' 9"  (1.753 m), weight 78.6 kg, SpO2 98 %.  Medical Problem List and Plan: 1.Left-sided weakness with gait  deficitsecondary to right thalamic infarction onset 12/15/19 as well as history of CVA in the past with residual left-sided weakness -patient may shower -ELOS/Goals: 5-7 days, mod I to supervision goals  Continue CIR PT, OT, and SLP 2. Antithrombotics: -DVT/anticoagulation:Lovenox. -antiplatelet therapy: Aspirin 81 mg daily and Plavix 75 mg daily x3 weeks then aspirin alone(stop date 4/21) 3. Pain Management:Tylenol as needed, denies pain.  4. Mood:Provide emotional support -antipsychotic agents: N/A 5. Neuropsych: This patientiscapable of making decisions on hisown behalf. 6. Skin/Wound Care:Routine skin checks 7. Fluids/Electrolytes/Nutrition:Routine in and outs with follow-up chemistries ordered for Monday 8. Hyperlipidemia. Lipitor 9. Dysphagia. Dysphagia #3 nectar liquids. Follow-up speech therapy- no signs of aspiration              -advance diet as tolerated             -encourage liquids             -BUN/Creat nl  10. Medical noncompliance. Counseling 11. HTN: pt on norvasc 10mg  at home  -bp's have been elevated thus far  -resume norvasc at 5mg  daily,increase to 10mg  on 4/6 Vitals:   12/24/19 0534 12/24/19 0759  BP: (!) 169/84 (!) 187/88  Pulse: 66 60  Resp:    Temp:    SpO2: 98%     4/8: BP elevated to 169/84. Cr normal. Will start Lisinopril 2.5mg  daily.  12. Right foot drag: Able to ambulate >1000 feet yesterday with PT with only mid increased in R foot drag.  LOS: 5 days A FACE TO FACE EVALUATION WAS PERFORMED  Clide Deutscher Avyukth Bontempo 12/24/2019, 9:14 AM

## 2019-12-24 NOTE — Progress Notes (Signed)
Physical Therapy Discharge Summary  Patient Details  Name: Eric Barron MRN: [id number] Date of Birth: 04-Apr-1951  Today's Date: 12/24/2019 PT Individual Time: 0810-0850 PT Individual Time Calculation (min): 40 min    Patient has met 11 of 11 long term goals due to improved activity tolerance, improved balance, improved postural control, increased strength, increased range of motion, decreased pain, ability to compensate for deficits and improved attention.  Patient to discharge at an ambulatory level Supervision.   Patient's care partner is independent to provide the necessary physical assistance at discharge.  Reasons goals not met: All PT goals met   Recommendation:  Patient will benefit from ongoing skilled PT services in home health setting to continue to advance safe functional mobility, address ongoing impairments in balance, safety, endurance, coordination, and minimize fall risk.  Equipment: No equipment provided  Reasons for discharge: treatment goals met and discharge from hospital  Patient/family agrees with progress made and goals achieved: Yes    PT treatment Session 1  Pt received supine in bed and agreeable to PT. PT assisted pt in finishing breakfast. Min cues for use of swallow strategies as needed to prevent risk of aspiration. Supine>sit transfer without assist or cues. PT instructed pt in Grad day assessment to measure progress toward goals. See below for details. Patient demonstrates mild increased fall risk as noted by score of   49/56 on Berg Balance Scale.  (<36= high risk for falls, close to 100%; 37-45 significant >80%; 46-51 moderate >50%; 52-55 lower >25%). PT instructed pt in gait training over level surface as listed below with SPC 265f x 2 and over unlevel surface with SPC and supervision assist for safety. Car transfer performed with [facility]and no cues or assist form PT. Patient returned to room and left [facility] in [facility] reach and all needs  met.    Session 2.  Pt received sitting in recliner  and agreeable to PT. Gait training with SEwingx [id number] distant supervision assist. PT instructed pt in  [place] balance program with UE support. LAQ, HS curls, hip abduction, tandem, mini squat, sit<>standx 10 each. Floor transfer with supervision assist and moderate verbal education provided for safety and when to call for help. Patient returned to room and left sitting in [facility] call bell in reach and all needs met.         PT Discharge Precautions/Restrictions Precautions Precautions: Fall Vital Signs Therapy Vitals Temp: 97.8 F (36.6 C) Temp Source: Oral Pulse Rate: 60 Resp: 18 BP: (!) 187/88 Patient Position (if appropriate): Lying Oxygen Therapy SpO2: 98 % O2 Device: Room Air Pain Pain Assessment Pain Scale: 0-10 Pain Score: 0-No pain Vision/Perception     WFL Cognition Overall Cognitive Status: History of cognitive impairments - at baseline Arousal/Alertness: Awake/alert Orientation Level: Oriented to person;Oriented to place;Oriented to situation;Disoriented to time Sensation Sensation Light Touch: Appears Intact Hot/Cold: Appears Intact Proprioception: Appears Intact Stereognosis: Not tested Coordination Gross Motor Movements are Fluid and Coordinated: Yes Fine Motor Movements are Fluid and Coordinated: Yes Heel Shin Test: WFL Bil Motor  Motor Motor: Hemiplegia Motor - Discharge Observations: significant improvement since initial evaluation  Mobility Bed Mobility Bed Mobility: Rolling Right;Rolling Left;Supine to Sit;Sit to Supine Rolling Right: Independent Rolling Left: Independent Supine to Sit: Independent Sit to Supine: Independent Transfers Transfers: Sit to Stand;Stand to SiLockheed Martinransfers Sit to Stand: Independent with assistive device Stand to Sit: Independent with assistive device Stand Pivot Transfers: Independent with  assistive device Transfer (Assistive  device): Straight cane Locomotion  Gait Ambulation: Yes Gait Assistance: Independent with assistive device Gait Distance (Feet): 200 Feet Assistive device: Straight cane Gait Gait: Yes Gait Pattern: Decreased stride length Stairs / Additional Locomotion Stairs Assistance: Independent with assistive device Stair Management Technique: Two rails Number of Stairs: 12 Height of Stairs: 6 Wheelchair Mobility Wheelchair Mobility: No  Trunk/Postural Assessment  Cervical Assessment Cervical Assessment: Within Functional Limits Thoracic Assessment Thoracic Assessment: Within Functional Limits Lumbar Assessment Lumbar Assessment: Within Functional Limits Postural Control Postural Control: Within Functional Limits  Balance Balance Balance Assessed: Yes Standardized Balance Assessment Standardized Balance Assessment: Berg Balance Test Berg Balance Test Sit to Stand: Able to stand without using hands and stabilize independently Standing Unsupported: Able to stand safely 2 minutes Sitting with Back Unsupported but Feet Supported on Floor or Stool: Able to sit safely and securely 2 minutes Stand to Sit: Sits safely with minimal use of hands Transfers: Able to transfer safely, minor use of hands Standing Unsupported with Eyes Closed: Able to stand 10 seconds safely Standing Ubsupported with Feet Together: Able to place feet together independently and stand 1 minute safely From Standing, Reach Forward with Outstretched Arm: Can reach confidently >25 cm (10") From Standing Position, Pick up Object from Floor: Able to pick up shoe safely and easily From Standing Position, Turn to Look Behind Over each Shoulder: Looks behind one side only/other side shows less weight shift Turn 360 Degrees: Able to turn 360 degrees safely but slowly Standing Unsupported, Alternately Place Feet on Step/Stool: Able to stand independently and complete 8 steps >20 seconds Standing Unsupported, One Foot in Front:  Able to plae foot ahead of the other independently and hold 30 seconds Standing on One Leg: Able to lift leg independently and hold equal to or more than 3 seconds Total Score: 49 Static Sitting Balance Static Sitting - Balance Support: No upper extremity supported;Feet supported Static Sitting - Level of Assistance: 7: Independent Dynamic Sitting Balance Dynamic Sitting - Balance Support: No upper extremity supported;During functional activity;Feet supported Dynamic Sitting - Level of Assistance: 7: Independent Dynamic Sitting - Balance Activities: Forward lean/weight shifting;Reaching for objects Static Standing Balance Static Standing - Balance Support: No upper extremity supported Static Standing - Level of Assistance: 7: Independent Dynamic Standing Balance Dynamic Standing - Balance Support: During functional activity;Left upper extremity supported Dynamic Standing - Level of Assistance: 6: Modified independent (Device/Increase time) Dynamic Standing - Balance Activities: Lateral lean/weight shifting;Forward lean/weight shifting Extremity Assessment  RUE Assessment RUE Assessment: Within Functional Limits LUE Assessment LUE Assessment: Within Functional Limits RLE Assessment RLE Assessment: Within Functional Limits Active Range of Motion (AROM) Comments: WFL for all functional mobility, tight hamstrings General Strength Comments: Grossly in sitting 5/5 throughout LLE Assessment LLE Assessment: Within Functional Limits Active Range of Motion (AROM) Comments: WFL for all functional mobility General Strength Comments: Grossly in sitting 5/5 throughout,    Lorie Phenix 12/24/2019, 8:50 AM

## 2019-12-24 NOTE — Care Management (Signed)
   The overall goal for the admission was met for:   Discharge location: Home with wife  Length of Stay: 6 days with discharge on 12/25/2019  Discharge activity level:  Patient to discharge at an ambulatory level Supervision.  Home/community participation: Clinical research associate provided included: MD, RD, PT, OT, SLP, RN, CM, TR, Pharmacy, Neuropsych and SW  Financial Services: Medicare  Follow-up services arranged: Home Health: PT, OT, SLP and Patient/Family has no preference for HH/DME agencies  Comments (or additional information): [facility] [phone]  Patient/Family verbalized understanding of follow-up arrangements: Yes   Family Education completed 12/24/19 with wife.  Individual responsible for coordination of the follow-up plan: Wife: Yusuke Beza; [phone]    Margarito Liner

## 2019-12-24 NOTE — Plan of Care (Signed)
  Problem: Consults Goal: RH STROKE PATIENT EDUCATION Description: See Patient Education module for education specifics  Outcome: Progressing   Problem: RH BOWEL ELIMINATION Goal: RH STG MANAGE BOWEL WITH ASSISTANCE Description: STG Manage Bowel with mod I Assistance. Outcome: Progressing   Problem: RH SAFETY Goal: RH STG ADHERE TO SAFETY PRECAUTIONS W/ASSISTANCE/DEVICE Description: STG Adhere to Safety Precautions With mod I Assistance/Device. Outcome: Progressing Goal: RH STG DECREASED RISK OF FALL WITH ASSISTANCE Description: STG Decreased Risk of Fall With mod I Assistance. Outcome: Progressing   Problem: RH COGNITION-NURSING Goal: RH STG USES MEMORY AIDS/STRATEGIES W/ASSIST TO PROBLEM SOLVE Description: STG Uses Memory Aids/Strategies With min Assistance to Problem Solve. Outcome: Progressing Goal: RH STG ANTICIPATES NEEDS/CALLS FOR ASSIST W/ASSIST/CUES Description: STG Anticipates Needs/Calls for Assist With min Assistance/Cues. Outcome: Progressing

## 2019-12-24 NOTE — Progress Notes (Signed)
Occupational Therapy Discharge Summary  Patient Details  Name: Eric Barron MRN: [id number] Date of Birth: 1950/11/10  Today's Date: 12/24/2019 OT Individual Time: 9937-1696 OT Individual Time Calculation (min): 42 min    Patient has met 9 of 9 long term goals due to improved activity tolerance, improved balance, postural control, ability to compensate for deficits and improved awareness.  Patient to discharge at overall Supervision level.  Patient's care partner is independent to provide the necessary supervision assistance at discharge.    Reasons goals not met: all goals met  Recommendation:  Patient will benefit from ongoing skilled OT services in home health setting to continue to advance functional skills in the area of BADL and iADL.  Equipment: No equipment provided  Reasons for discharge: treatment goals met  Patient/family agrees with progress made and goals achieved: Yes   OT Intervention: Pt seated in recliner chair with no c/o pain and agreeable to OT intervention. PT's wife present for hands on family education. OT reviewed with wife pt's goals at supervision level overall and his performance with self care tasks and functional mobility with use of SPC. Pt ambulating with SPC and supervision to tub shower room and demonstrated stepping into/out of tub ledge with close supervision for balance. Pt returning back to room at overall supervision level. OT answering caregiver's questions and providing education on what to expect day of discharge and with HHOT recommendation. Pt remained in chair with wife present at end of session. All needs within reach.   OT Discharge Precautions/Restrictions  Precautions Precautions: Fall General   Vital Signs Therapy Vitals Temp: 97.8 F (36.6 C) Temp Source: Oral Pulse Rate: 66 Resp: 18 BP: (!) 169/84 Patient Position (if appropriate): Lying Oxygen Therapy SpO2: 98 % O2 Device: Room Air Pain Pain Assessment Pain Scale:  0-10 Pain Score: 0-No pain ADL ADL Eating: Set up Grooming: Contact guard Where Assessed-Grooming: Standing at sink Upper Body Bathing: Setup Where Assessed-Upper Body Bathing: Edge of bed Lower Body Bathing: Contact guard Where Assessed-Lower Body Bathing: Edge of bed Upper Body Dressing: Supervision/safety Where Assessed-Upper Body Dressing: Edge of bed Lower Body Dressing: Minimal assistance Where Assessed-Lower Body Dressing: Edge of bed Toileting: Not assessed Toilet Transfer: Therapist, music Method: Ambulating(RW) Gaffer Transfer: Minimal assistance Social research officer, government Method: Ambulating(without AD) Vision Baseline Vision/History: Wears glasses Wears Glasses: At all times Patient Visual Report: No change from baseline Perception    Praxis   Cognition Overall Cognitive Status: History of cognitive impairments - at baseline Arousal/Alertness: Awake/alert Orientation Level: Oriented to person;Oriented to situation Sensation Sensation Light Touch: Appears Intact Hot/Cold: Appears Intact Proprioception: Appears Intact Stereognosis: Not tested Coordination Gross Motor Movements are Fluid and Coordinated: Yes Fine Motor Movements are Fluid and Coordinated: Yes Motor  Motor Motor: Hemiplegia Motor - Discharge Observations: improved since initial evaluation Mobility  Bed Mobility Bed Mobility: Rolling Right;Rolling Left;Supine to Sit;Sit to Supine Rolling Right: Independent Rolling Left: Independent Supine to Sit: Independent Sit to Supine: Independent Transfers Sit to Stand: Supervision/Verbal cueing Stand to Sit: Supervision/Verbal cueing  Trunk/Postural Assessment  Cervical Assessment Cervical Assessment: Within Functional Limits Thoracic Assessment Thoracic Assessment: Within Functional Limits Lumbar Assessment Lumbar Assessment: Within Functional Limits Postural Control Postural Control: Within Functional Limits   Balance Balance Balance Assessed: Yes Static Sitting Balance Static Sitting - Balance Support: No upper extremity supported;Feet supported Static Sitting - Level of Assistance: 7: Independent Dynamic Sitting Balance Dynamic Sitting - Balance Support: No upper extremity supported;During functional activity;Feet supported Dynamic Sitting -  Level of Assistance: 7: Independent Static Standing Balance Static Standing - Balance Support: No upper extremity supported Static Standing - Level of Assistance: 7: Independent Dynamic Standing Balance Dynamic Standing - Balance Support: No upper extremity supported;During functional activity Dynamic Standing - Level of Assistance: 5: Stand by assistance Extremity/Trunk Assessment RUE Assessment RUE Assessment: Within Functional Limits LUE Assessment LUE Assessment: Within Functional Limits   Gypsy Decant 12/24/2019, 7:43 AM

## 2019-12-24 NOTE — Discharge Instructions (Signed)
Inpatient Rehab Discharge Instructions  Eric Barron Discharge date and time: No discharge date for patient encounter.   Activities/Precautions/ Functional Status: Activity: activity as tolerated Diet: Soft Wound Care: none needed Functional status:  ___ No restrictions     ___ Walk up steps independently ___ 24/7 supervision/assistance   ___ Walk up steps with assistance ___ Intermittent supervision/assistance  ___ Bathe/dress independently ___ Walk with walker     _x__ Bathe/dress with assistance ___ Walk Independently    ___ Shower independently ___ Walk with assistance    ___ Shower with assistance ___ No alcohol     ___ Return to work/school ________  COMMUNITY REFERRALS UPON DISCHARGE:  Home Health: PT, OT, ST  Agency:[facility] Phone:[phone]  Special Instructions: No driving smoking or alcohol  Aspirin and Plavix until 01/06/2020 then aspirin alone  My questions have been answered and I understand these instructions. I will adhere to these goals and the provided educational materials after my discharge from the hospital.  Patient/Caregiver Signature _______________________________ Date __________  Clinician Signature _______________________________________ Date __________  Please bring this form and your medication list with you to all your follow-up doctor's appointments.

## 2019-12-24 NOTE — Progress Notes (Signed)
Speech Language Pathology Daily Session Note  Patient Details  Name: Eric Barron MRN: [id number] Date of Birth: Aug 05, 1951  Today's Date: 12/24/2019 SLP Individual Time: 4982-6415 SLP Individual Time Calculation (min): 55 min  Short Term Goals: Week 1: SLP Short Term Goal 1 (Week 1): STG=LTG due to ELOS  Skilled Therapeutic Interventions: Pt was seen for skilled ST targeting dysphagia and cognitive goals. Pt observed with thin liquids to assess tolerance since recent upgrade after MBSS 12/24/19. Pt consumed sips of thin from straw without overt s/sx aspiration after verbal review of swallow strategies pt demonstrated their use during intake Mod I. SLP also reviewed pt's MBSS with pt, describing basic swallow anatomy and process, pointing out specific abnormalities and mild aspiration risk. Recommend continue current diet.  During a semi-complex medication management task, pt used list of current medications to organize BID pill box with 100% accuracy and Supervision A verbal cues for organization and problem solving. He required Mod A verbal cues for recall of medication functions, although able to recall number of each type of medicine with use of chunking strategy with Min A during a 5 minute delayed recall task. Pt left sitting in recliner with alarm set and needs met to his satisfaction, seatbelt alarm in place. Continue per current plan of care.        Pain Pain Assessment Pain Scale: 0-10 Pain Score: 0-No pain  Therapy/Group: Individual Therapy  Arbutus Leas 12/24/2019, 7:16 AM

## 2019-12-25 NOTE — Plan of Care (Signed)
  Problem: Consults Goal: RH STROKE PATIENT EDUCATION Description: See Patient Education module for education specifics  Outcome: Completed/Met   Problem: RH BOWEL ELIMINATION Goal: RH STG MANAGE BOWEL WITH ASSISTANCE Description: STG Manage Bowel with mod I Assistance. Outcome: Completed/Met   Problem: RH SAFETY Goal: RH STG ADHERE TO SAFETY PRECAUTIONS W/ASSISTANCE/DEVICE Description: STG Adhere to Safety Precautions With mod I Assistance/Device. Outcome: Completed/Met Goal: RH STG DECREASED RISK OF FALL WITH ASSISTANCE Description: STG Decreased Risk of Fall With mod I Assistance. Outcome: Completed/Met   Problem: RH COGNITION-NURSING Goal: RH STG USES MEMORY AIDS/STRATEGIES W/ASSIST TO PROBLEM SOLVE Description: STG Uses Memory Aids/Strategies With min Assistance to Problem Solve. Outcome: Completed/Met Goal: RH STG ANTICIPATES NEEDS/CALLS FOR ASSIST W/ASSIST/CUES Description: STG Anticipates Needs/Calls for Assist With min Assistance/Cues. Outcome: Completed/Met   Problem: RH KNOWLEDGE DEFICIT Goal: RH STG INCREASE KNOWLEDGE OF DYSPHAGIA/FLUID INTAKE Outcome: Completed/Met Goal: RH STG INCREASE KNOWLEGDE OF HYPERLIPIDEMIA Outcome: Completed/Met Goal: RH STG INCREASE KNOWLEDGE OF STROKE PROPHYLAXIS Outcome: Completed/Met

## 2019-12-25 NOTE — Progress Notes (Signed)
[place] PHYSICAL MEDICINE & REHABILITATION PROGRESS NOTE   Subjective/Complaints: Up in bed. Anxious to get home!  ROS: Patient denies fever, rash, sore throat, blurred vision, nausea, vomiting, diarrhea, cough, shortness of breath or chest pain, joint or back pain, headache, or mood change.   Objective:   DG Swallowing Func-Speech Pathology  Result Date: 12/23/2019 Objective Swallowing Evaluation: Type of Study: MBS-Modified Barium Swallow Study  Patient Details Name: Eric Barron MRN: [id number] Date of Birth: 13-Jun-1951 Today's Date: 12/23/2019 Past Medical History: Past Medical History: Diagnosis Date . 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 CVA, old, dysarthria  . HTN (hypertension)  Past Surgical History: No past surgical history on file. HPI: 69 yo male adm to [facility] with confusion, disorientation.  Pt has h/o pontine CVA (left) and thalamic cva in 2003.  He is LEFT HANDED.  Pt also with h/o tobacco use.  Imaging study showed right thalamocapsular CVA.  Pt did not pass the [facility] screen and SLP was ordered.  MBSS 12/16/19 recc Dys 3 nectar thick liquids. Pt [facility] to [facility] 12/19/19.  Assessment / Plan / Recommendation CHL IP CLINICAL IMPRESSIONS 12/23/2019 Clinical Impression Pt's presents with mild pharyngeal dysphagia with improved ability to protect his airway with thin liquids today in comparison to previous MBSS on 12/16/19. Pt's cervical spine appears abnormal, in that it protrudes into pharyngeal space resulting in decreased eppiglottic inversion and overall subjectively smaller pharyngeal space than would be expected. Despite incomplete airway closure, no aspiration was observed throughout thin barium trials vial cup and straw, or when consuming thin with barium tablet. Shallow penetration of thin barium above the level of the vocal folds observed in <25% trials, and all penetrates were ejected during the swallow. Due to incomplete inversion of epiglottis,  pt with intermittent and minimal residue in vallecular space and coating the underside of the epiglottis, which pt intermittently sensed and spontaneously triggered volitional dry swallow which assisted with clearance of residue. Given improvements in airway protection as well as increase in mobility since admission to CIR and on clinical s/sx respiratory distress, recommend pt upgrade to thin liquids and use extra dry swallow to ensure clearance of any pharyngeal residue. Continue Dysphagia 3 (mech soft) solids, medications may be administered whole with thins or applesauce. ST will continue to follow to provide skilled interventions to ensure diet safety and efficiency as well as additional education regarding dysphagia while inpatient. SLP Visit Diagnosis Dysphagia, pharyngeal phase (R13.13) Attention and concentration deficit following -- Frontal lobe and executive function deficit following -- Impact on safety and function Mild aspiration risk   CHL IP TREATMENT RECOMMENDATION 12/16/2019 Treatment Recommendations Therapy as outlined in treatment plan below   Prognosis 12/16/2019 Prognosis for Safe Diet Advancement Fair Barriers to Reach Goals Time post onset Barriers/Prognosis Comment -- CHL IP DIET RECOMMENDATION 12/23/2019 SLP Diet Recommendations Dysphagia 3 (Mech soft) solids;Thin liquid Liquid Administration via Cup;Straw Medication Administration Whole meds with liquid Compensations Slow rate;Small sips/bites;Multiple dry swallows after each bite/sip Postural Changes Remain semi-upright after after feeds/meals (Comment);Seated upright at 90 degrees   CHL IP OTHER RECOMMENDATIONS 12/23/2019 Recommended Consults -- Oral Care Recommendations Oral care BID Other Recommendations --   CHL IP FOLLOW UP RECOMMENDATIONS 12/23/2019 Follow up Recommendations Home health SLP;24 hour supervision/assistance   CHL IP FREQUENCY AND DURATION 12/16/2019 Speech Therapy Frequency (ACUTE ONLY) min 1 x/week Treatment Duration 1 week       CHL IP ORAL PHASE 12/23/2019 Oral Phase [facility]  Oral - Pudding Teaspoon -- Oral - Pudding Cup -- Oral - Honey Teaspoon -- Oral - Honey Cup -- Oral - Nectar Teaspoon -- Oral - Nectar Cup -- Oral - Nectar Straw -- Oral - Thin Teaspoon -- Oral - Thin Cup -- Oral - Thin Straw -- Oral - Puree -- Oral - Mech Soft -- Oral - Regular -- Oral - Multi-Consistency -- Oral - Pill -- Oral Phase - Comment --  CHL IP PHARYNGEAL PHASE 12/23/2019 Pharyngeal Phase Impaired Pharyngeal- Pudding Teaspoon -- Pharyngeal -- Pharyngeal- Pudding Cup -- Pharyngeal -- Pharyngeal- Honey Teaspoon -- Pharyngeal -- Pharyngeal- Honey Cup -- Pharyngeal -- Pharyngeal- Nectar Teaspoon -- Pharyngeal -- Pharyngeal- Nectar Cup NT Pharyngeal -- Pharyngeal- Nectar Straw NT Pharyngeal -- Pharyngeal- Thin Teaspoon NT Pharyngeal -- Pharyngeal- Thin Cup Reduced epiglottic inversion;Penetration/Aspiration during swallow;Pharyngeal residue - valleculae;Compensatory strategies attempted (with notebox);Reduced airway/laryngeal closure Pharyngeal Material enters airway, remains ABOVE vocal cords then ejected out Pharyngeal- Thin Straw Reduced epiglottic inversion;Reduced airway/laryngeal closure;Pharyngeal residue - valleculae;Penetration/Aspiration during swallow Pharyngeal Material enters airway, remains ABOVE vocal cords then ejected out Pharyngeal- Puree NT Pharyngeal -- Pharyngeal- Mechanical Soft NT Pharyngeal -- Pharyngeal- Regular -- Pharyngeal -- Pharyngeal- Multi-consistency -- Pharyngeal -- Pharyngeal- Pill WFL Pharyngeal Material does not enter airway Pharyngeal Comment --  CHL IP CERVICAL ESOPHAGEAL PHASE 12/23/2019 Cervical Esophageal Phase WFL Pudding Teaspoon -- Pudding Cup -- Honey Teaspoon -- Honey Cup -- Nectar Teaspoon -- Nectar Cup -- Nectar Straw -- Thin Teaspoon -- Thin Cup -- Thin Straw -- Puree -- Mechanical Soft -- Regular -- Multi-consistency -- Pill -- Cervical Esophageal Comment -- Arbutus Leas 12/23/2019, 9:47 AM              No results for  input(s): WBC, HGB, HCT, PLT in the last 72 hours. No results for input(s): NA, K, CL, CO2, GLUCOSE, BUN, CREATININE, CALCIUM in the last 72 hours.  Intake/Output Summary (Last 24 hours) at 12/25/2019 0839 Last data filed at 12/24/2019 1858 Gross per 24 hour  Intake 360 ml  Output --  Net 360 ml     Physical Exam: Vital Signs Blood pressure (!) 174/76, pulse 62, temperature 98.1 F (36.7 C), temperature source Oral, resp. rate 20, height 5\' 9"  (1.753 m), weight 78.6 kg, SpO2 95 %. Constitutional: No distress . Vital signs reviewed. HEENT: EOMI, oral membranes moist Neck: supple Cardiovascular: RRR without murmur. No JVD    Respiratory/Chest: CTA Bilaterally without wheezes or rales. Normal effort    GI/Abdomen: BS +, non-tender, non-distended Ext: no clubbing, cyanosis, or edema Psych: pleasant and cooperative Musculoskeletal: Full range of motion in all 4 extremities. No joint swelling Improved insight and awareness. RUE 5/5. RLE 5/5. LUE 4+/5. LLE 4+/5. PD LUE, dysmetria LUE,LLE. Senses pain and LT equally right to left. Skin: Skin iswarmand dry. He isnot diaphoretic.    Assessment/Plan: 1. Functional deficits secondary to right thalamic infarct which require 3+ hours per day of interdisciplinary therapy in a comprehensive inpatient rehab setting.  Physiatrist is providing close team supervision and 24 hour management of active medical problems listed below.  Physiatrist and rehab team continue to assess barriers to discharge/monitor patient progress toward functional and medical goals  Care Tool:  Bathing    Body parts bathed by patient: Right arm, Left arm, Chest, Abdomen, Front perineal area, Buttocks, Right upper leg, Left upper leg, Right lower leg, Left lower leg, Face         Bathing assist Assist Level: Supervision/Verbal cueing     Upper  Body Dressing/Undressing Upper body dressing   What is the patient wearing?: Pull over shirt    Upper body assist Assist  Level: Supervision/Verbal cueing    Lower Body Dressing/Undressing Lower body dressing      What is the patient wearing?: Pants     Lower body assist Assist for lower body dressing: Supervision/Verbal cueing     Toileting Toileting Toileting Activity did not occur (Clothing management and hygiene only): N/A (no void or bm)  Toileting assist Assist for toileting: Supervision/Verbal cueing     Transfers Chair/bed transfer  Transfers assist     Chair/bed transfer assist level: Independent with assistive device     Locomotion Ambulation   Ambulation assist      Assist level: Supervision/Verbal cueing Assistive device: Cane-straight Max distance: 150   Walk 10 feet activity   Assist     Assist level: Supervision/Verbal cueing Assistive device: Cane-straight   Walk 50 feet activity   Assist    Assist level: Supervision/Verbal cueing Assistive device: Cane-straight    Walk 150 feet activity   Assist    Assist level: Supervision/Verbal cueing Assistive device: Cane-straight    Walk 10 feet on uneven surface  activity   Assist     Assist level: Supervision/Verbal cueing Assistive device: Cane-straight   Wheelchair     Assist Will patient use wheelchair at discharge?: No   Wheelchair activity did not occur: N/A         Wheelchair 50 feet with 2 turns activity    Assist    Wheelchair 50 feet with 2 turns activity did not occur: N/A       Wheelchair 150 feet activity     Assist  Wheelchair 150 feet activity did not occur: N/A       Blood pressure (!) 174/76, pulse 62, temperature 98.1 F (36.7 C), temperature source Oral, resp. rate 20, height 5\' 9"  (1.753 m), weight 78.6 kg, SpO2 95 %.  Medical Problem List and Plan: 1.Left-sided weakness with gait deficitsecondary to right thalamic infarction onset 12/15/19 as well as history of CVA in the past with residual left-sided weakness -patient may  shower -dc home today  Patient to see Dr. Ranell Patrick in the office for transitional care encounter in 1-2 weeks.     2. Antithrombotics: -DVT/anticoagulation:Lovenox. -antiplatelet therapy: Aspirin 81 mg daily and Plavix 75 mg daily x3 weeks then aspirin alone(stop date 4/21) 3. Pain Management:Tylenol as needed, denies pain.  4. Mood:Provide emotional support -antipsychotic agents: N/A 5. Neuropsych: This patientiscapable of making decisions on hisown behalf. 6. Skin/Wound Care:Routine skin checks 7. Fluids/Electrolytes/Nutrition:Routine in and outs with follow-up chemistries ordered for Monday 8. Hyperlipidemia. Lipitor 9. Dysphagia. Dysphagia #3 nectar liquids. Follow-up speech therapy- no signs of aspiration              -advance diet as tolerated             -encourage liquids             -BUN/Creat nl  10. Medical noncompliance. Counseling 11. HTN: pt on norvasc 10mg  at home  -bp's have been elevated thus far  -resume norvasc at 5mg  daily,increased to 10mg  on 4/6 Vitals:   12/24/19 2040 12/25/19 0325  BP: (!) 190/80 (!) 174/76  Pulse: 73 62  Resp:  20  Temp:  98.1 F (36.7 C)  SpO2: 98% 95%    4/8: BP elevated to 169/84. Cr normal. Will start Lisinopril 2.5mg  daily.   4/9: lisinopril just started, sl improvement. Continue  at this dose and f/u as outpt      LOS: 6 days A FACE TO FACE EVALUATION WAS PERFORMED  Meredith Staggers 12/25/2019, 8:39 AM

## 2019-12-25 NOTE — Progress Notes (Signed)
Patient was discharged from 45M07.  Patient left floor via wheelchair escorted by nursing staff.  Patient verbalized understanding of discharge instructions as given by Marlowe Shores, PA.  All patient belongings sent with patient including DME and prescriptions.  Patient appears to be in no immediate distress at this time.    Brita Romp, RN

## 2019-12-29 ENCOUNTER — Telehealth: Payer: Self-pay | Admitting: *Deleted

## 2019-12-29 NOTE — Telephone Encounter (Addendum)
3:20 pm 12/29/19 First attempt to reach Mr Mahony. NO answer and VM has not been set up. Transitional Care call--I spoke with MR and Mrs Schreib 12/31/19    1. Are you/is patient experiencing any problems since coming home? Are there any questions regarding any aspect of care?NO  2. Are there any questions regarding medications administration/dosing? Are meds being taken as prescribed? Patient should review meds with caller to confirm YES< KNOWS TO STOP PLAVIX AFTER 01/06/20 3. Have there been any falls?NO 4. Has Home Health been to the house and/or have they contacted you? If not, have you tried to contact them? Can we help you contact them?YES start pf care has been out and therapy starts Saturday. 5. Are bowels and bladder emptying properly? Are there any unexpected incontinence issues? If applicable, is patient following bowel/bladder programs? NO 6. Any fevers, problems with breathing, unexpected pain? NO 7. Are there any skin problems or new areas of breakdown?NO 8. Has the patient/family member arranged specialty MD follow up (ie cardiology/neurology/renal/surgical/etc)?  Can we help arrange? Appointment given to see Zella Ball NP and then will follow with Kirsteins thereafter. 9. Does the patient need any other services or support that we can help arrange? NO 10. Are caregivers following through as expected in assisting the patient?YES 11. Has the patient quit smoking, drinking alcohol, or using drugs as recommended? YES Appointment 01/04/20 Monday @ 2:40, arrive by 2:20 to see Danella Sensing NP then Dr Letta Pate thereafter. They have already received packet in the mail. [place]

## 2019-12-31 ENCOUNTER — Telehealth: Payer: Self-pay

## 2019-12-31 NOTE — Telephone Encounter (Signed)
Sree, PT/[facility] called requesting verbal orders HHPT 1wk4. Orders approved and given.

## 2020-01-04 ENCOUNTER — Encounter: Payer: Medicare Other | Attending: Registered Nurse | Admitting: Registered Nurse

## 2020-01-14 ENCOUNTER — Telehealth: Payer: Self-pay

## 2020-01-14 DIAGNOSIS — Z86718 Personal history of other venous thrombosis and embolism: Secondary | ICD-10-CM

## 2020-01-14 DIAGNOSIS — Z7902 Long term (current) use of antithrombotics/antiplatelets: Secondary | ICD-10-CM

## 2020-01-14 DIAGNOSIS — I69318 Other symptoms and signs involving cognitive functions following cerebral infarction: Secondary | ICD-10-CM | POA: Diagnosis not present

## 2020-01-14 DIAGNOSIS — R1310 Dysphagia, unspecified: Secondary | ICD-10-CM

## 2020-01-14 DIAGNOSIS — F0150 Vascular dementia without behavioral disturbance: Secondary | ICD-10-CM

## 2020-01-14 DIAGNOSIS — I10 Essential (primary) hypertension: Secondary | ICD-10-CM

## 2020-01-14 DIAGNOSIS — I69392 Facial weakness following cerebral infarction: Secondary | ICD-10-CM | POA: Diagnosis not present

## 2020-01-14 DIAGNOSIS — I69354 Hemiplegia and hemiparesis following cerebral infarction affecting left non-dominant side: Secondary | ICD-10-CM | POA: Diagnosis not present

## 2020-01-14 DIAGNOSIS — E785 Hyperlipidemia, unspecified: Secondary | ICD-10-CM

## 2020-01-14 DIAGNOSIS — R7303 Prediabetes: Secondary | ICD-10-CM

## 2020-01-14 NOTE — Telephone Encounter (Signed)
Eric Barron, PT/[facility] called stating at patients visit today his BP was 178/76 and about 20 mins after meds no change, asymptomatic.

## 2020-01-14 NOTE — Telephone Encounter (Signed)
PCP or I can address at his next follow up visit.

## 2020-01-15 NOTE — Telephone Encounter (Signed)
Attempted to call patient and PT no voicemail set up on either phone numbers. Patient no showed TC appt.

## 2020-02-04 ENCOUNTER — Ambulatory Visit: Payer: Medicare Other | Admitting: Neurology

## 2020-09-23 ENCOUNTER — Other Ambulatory Visit: Payer: Medicare Other

## 2020-09-23 DIAGNOSIS — Z20822 Contact with and (suspected) exposure to covid-19: Secondary | ICD-10-CM

## 2020-09-26 LAB — NOVEL CORONAVIRUS, NAA: SARS-CoV-2, NAA: DETECTED — AB

## 2020-09-27 ENCOUNTER — Telehealth: Payer: Self-pay | Admitting: Family

## 2020-09-27 NOTE — Telephone Encounter (Signed)
Called to discuss with patient about COVID-19 symptoms and the use of one of the available treatments for those with mild to moderate Covid symptoms and at a high risk of hospitalization.  Pt appears to qualify for outpatient treatment due to co-morbid conditions and/or a member of an at-risk group in accordance with the FDA Emergency Use Authorization.    Symptom onset: Unknown Vaccinated: No  Booster? No Qualifiers: Age,CVA, hypertension, hyperlipidemia  Attempted to speak with Mr. Dimperio and was unable to get in touch with him via phone. No voicemail was set up and he does not have a MyChart account activated. Depending on onset of symptoms and if he is symptomatic appears to be a candidate for Sotrovimab.    Terri Piedra, NP 09/27/2020 11:34 AM

## 2020-09-30 ENCOUNTER — Other Ambulatory Visit: Payer: Medicare Other

## 2021-05-01 IMAGING — CT CT ANGIO HEAD
2 of 7 series · 8 of 33 positions shown · IV contrast (OMNI 350)
Comparison: None.

CLINICAL DATA: Stroke follow-up

EXAM:
CT ANGIOGRAPHY HEAD AND NECK
TECHNIQUE: Multidetector CT imaging of the head and neck was performed using
the standard protocol during bolus administration of intravenous
contrast. Multiplanar CT image reconstructions and MIPs were
obtained to evaluate the vascular anatomy. Carotid stenosis
measurements (when applicable) are obtained utilizing NASCET
criteria, using the distal internal carotid diameter as the
denominator.
CONTRAST:  75mL OMNIPAQUE IOHEXOL 350 MG/ML SOLN

[Series 5: cta neck · axial · 0.55mm/px · z∈[-238,-104]mm · 2 of 201 slices shown]
[im 67/201  soft-tissue]
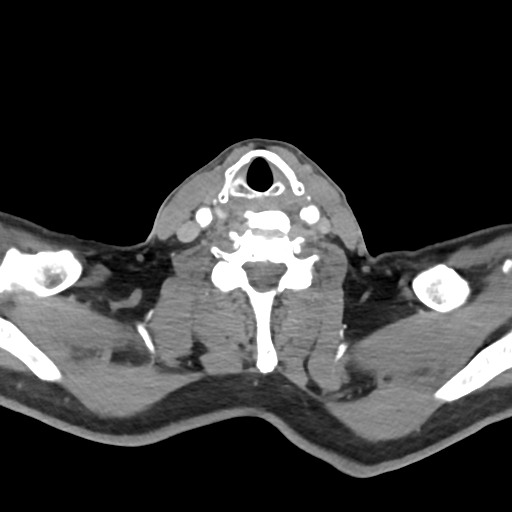
[im 134/201  soft-tissue]
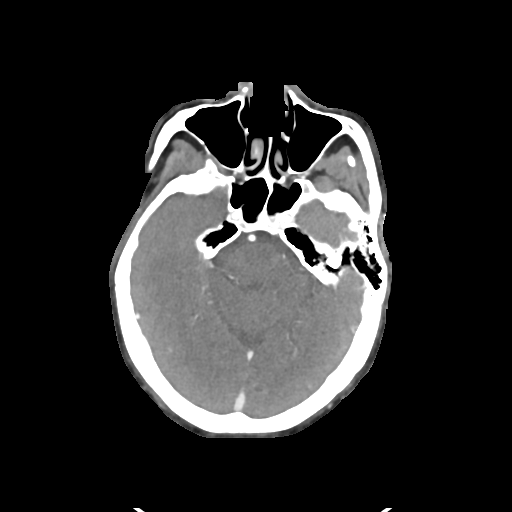

[Series 7: cta neck axial · axial · 0.49mm/px · z∈[-313,-32]mm · 6 of 395 slices shown]
[im 57/395  soft-tissue]
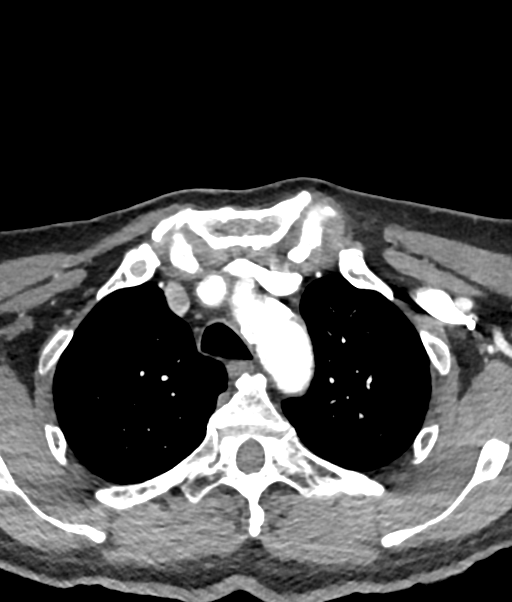
[im 113/395  bone]
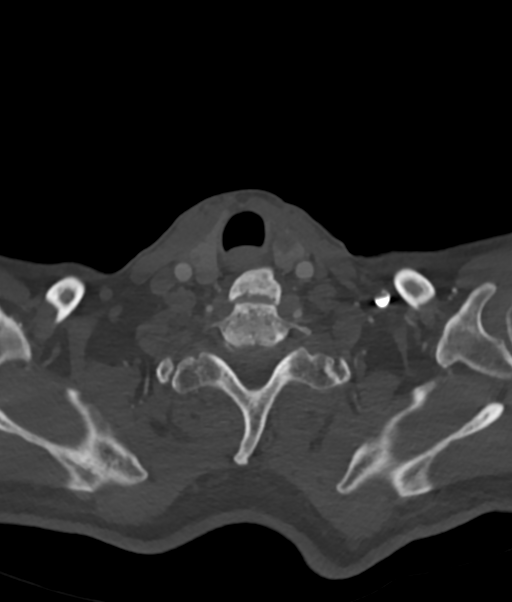
[im 169/395  soft-tissue]
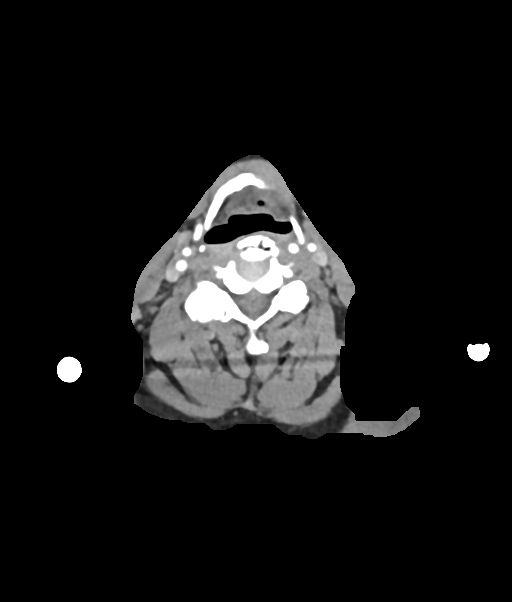
[im 226/395  bone]
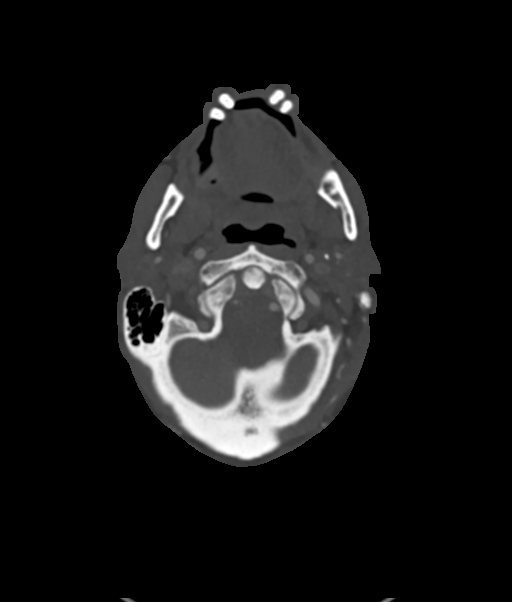
[im 282/395  soft-tissue]
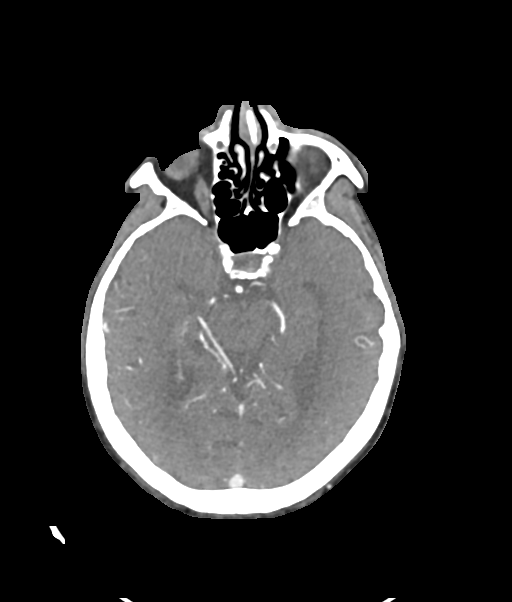
[im 338/395  bone]
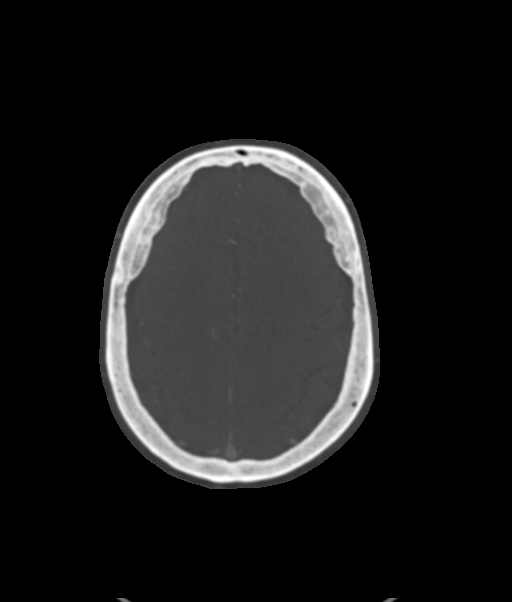

[8 of 33 positions shown; findings below may reference images not displayed]

FINDINGS: CTA NECK FINDINGS

SKELETON: There is no bony spinal canal stenosis. No lytic or
blastic lesion.

OTHER NECK: Normal pharynx, larynx and major salivary glands. No
cervical lymphadenopathy. Unremarkable thyroid gland.

UPPER CHEST: No pneumothorax or pleural effusion. No nodules or
masses.

AORTIC ARCH:

There is mild calcific atherosclerosis of the aortic arch. There is
no aneurysm, dissection or hemodynamically significant stenosis of
the visualized portion of the aorta. Conventional 3 vessel aortic
branching pattern. The visualized proximal subclavian arteries are
widely patent.

RIGHT CAROTID SYSTEM: No dissection, occlusion or aneurysm. There is
mixed density atherosclerosis extending into the proximal ICA,
resulting in less than 50% stenosis.

LEFT CAROTID SYSTEM: No dissection, occlusion or aneurysm. There is
mixed density atherosclerosis extending into the proximal ICA,
resulting in less than 50% stenosis.

VERTEBRAL ARTERIES: Left dominant configuration. Both origins are
clearly patent. There is no dissection, occlusion or flow-limiting
stenosis to the skull base (V1-V3 segments).

CTA HEAD FINDINGS

POSTERIOR CIRCULATION:

--Vertebral arteries: Normal V4 segments.

--Posterior inferior cerebellar arteries (PICA): Patent origins from
the vertebral arteries.

--Anterior inferior cerebellar arteries (AICA): Patent origins from
the basilar artery.

--Basilar artery: Normal.

--Superior cerebellar arteries: Normal.

--Posterior cerebral arteries: Normal. Both originate from the
basilar artery. Posterior communicating arteries (p-comm) are
diminutive or absent.

ANTERIOR CIRCULATION:

--Intracranial internal carotid arteries: Normal.

--Anterior cerebral arteries (ACA): Normal. Both A1 segments are
present. Patent anterior communicating artery (a-comm).

--Middle cerebral arteries (MCA): Normal.

VENOUS SINUSES: As permitted by contrast timing, patent.

ANATOMIC VARIANTS: None

Review of the MIP images confirms the above findings.
IMPRESSION: 1. No emergent large vessel occlusion or hemodynamically significant
stenosis of the head or neck.
2. Bilateral proximal ICA atherosclerosis with less than 50%
stenosis.

## 2022-07-02 ENCOUNTER — Telehealth: Payer: Self-pay | Admitting: *Deleted

## 2022-07-02 NOTE — Patient Outreach (Signed)
  Care Coordination   07/02/2022 Name: OLANDER FRIEDL MRN: [id number] DOB: [birth year]   Care Coordination Outreach Attempts:  An unsuccessful telephone outreach was attempted today to offer the patient information about available care coordination services as a benefit of their health plan.   Follow Up Plan:  Additional outreach attempts will be made to offer the patient care coordination information and services.   Encounter Outcome:  No Answer  Care Coordination Interventions Activated:  No   Care Coordination Interventions:  No, not indicated    Jacqlyn Larsen [facility], [place] RN Care Coordinator [phone]

## 2022-07-03 ENCOUNTER — Telehealth: Payer: Self-pay | Admitting: *Deleted

## 2022-07-03 NOTE — Patient Outreach (Signed)
  Care Coordination   07/03/2022 Name: Eric Barron MRN: [id number] DOB: [birth year]   Care Coordination Outreach Attempts:  A second unsuccessful outreach was attempted today to offer the patient with information about available care coordination services as a benefit of their health plan.     Follow Up Plan:  Additional outreach attempts will be made to offer the patient care coordination information and services.   Encounter Outcome:  No Answer  Care Coordination Interventions Activated:  No   Care Coordination Interventions:  No, not indicated    Jacqlyn Larsen [facility] [place] RN Care Coordinator [phone]

## 2022-07-20 ENCOUNTER — Telehealth: Payer: Self-pay | Admitting: *Deleted

## 2022-07-20 NOTE — Patient Outreach (Signed)
  Care Coordination   07/20/2022 Name: Eric Barron MRN: [id number] DOB: 06/08/1951   Care Coordination Outreach Attempts:  A third unsuccessful outreach was attempted today to offer the patient with information about available care coordination services as a benefit of their health plan.   Follow Up Plan:  No further outreach attempts will be made at this time. We have been unable to contact the patient to offer or enroll patient in care coordination services  Encounter Outcome:  No Answer  Care Coordination Interventions Activated:  No   Care Coordination Interventions:  No, not indicated    SIG Chayanne Speir C. Myrtie Neither, MSN, [facility] Gerontological Nurse Practitioner [facility] Care Management [phone]

## 2022-11-08 ENCOUNTER — Encounter: Payer: Self-pay | Admitting: Emergency Medicine

## 2022-11-08 ENCOUNTER — Emergency Department
Admission: EM | Admit: 2022-11-08 | Discharge: 2022-11-08 | Disposition: A | Discharge status: Home / Self Care | Payer: No Typology Code available for payment source | Attending: Emergency Medicine | Admitting: Emergency Medicine

## 2022-11-08 ENCOUNTER — Emergency Department: Payer: No Typology Code available for payment source

## 2022-11-08 ENCOUNTER — Other Ambulatory Visit: Payer: Self-pay

## 2022-11-08 DIAGNOSIS — S0990XA Unspecified injury of head, initial encounter: Secondary | ICD-10-CM | POA: Diagnosis present

## 2022-11-08 DIAGNOSIS — S0081XA Abrasion of other part of head, initial encounter: Secondary | ICD-10-CM | POA: Insufficient documentation

## 2022-11-08 DIAGNOSIS — W19XXXA Unspecified fall, initial encounter: Secondary | ICD-10-CM

## 2022-11-08 DIAGNOSIS — W010XXA Fall on same level from slipping, tripping and stumbling without subsequent striking against object, initial encounter: Secondary | ICD-10-CM | POA: Diagnosis not present

## 2022-11-08 DIAGNOSIS — S80211A Abrasion, right knee, initial encounter: Secondary | ICD-10-CM | POA: Diagnosis not present

## 2022-11-08 DIAGNOSIS — Y9301 Activity, walking, marching and hiking: Secondary | ICD-10-CM | POA: Diagnosis not present

## 2022-11-08 DIAGNOSIS — I10 Essential (primary) hypertension: Secondary | ICD-10-CM | POA: Insufficient documentation

## 2022-11-08 LAB — BASIC METABOLIC PANEL
Anion gap: 9 (ref 5–15)
BUN: 22 mg/dL (ref 8–23)
CO2: 25 mmol/L (ref 22–32)
Calcium: 8.9 mg/dL (ref 8.9–10.3)
Chloride: 107 mmol/L (ref 98–111)
Creatinine, Ser: 1.02 mg/dL (ref 0.61–1.24)
GFR, Estimated: >60 mL/min (ref >60)
Glucose, Bld: 107 mg/dL — ABNORMAL HIGH (ref 70–99)
Potassium: 3.6 mmol/L (ref 3.5–5.1)
Sodium: 141 mmol/L (ref 135–145)

## 2022-11-08 LAB — CBC
HCT: 44.6 % (ref 39.0–52.0)
Hemoglobin: 14.4 g/dL (ref 13.0–17.0)
MCH: 29 pg (ref 26.0–34.0)
MCHC: 32.3 g/dL (ref 30.0–36.0)
MCV: 89.7 fL (ref 80.0–100.0)
Platelets: 170 10*3/uL (ref 150–400)
RBC: 4.97 MIL/uL (ref 4.22–5.81)
RDW: 13.6 % (ref 11.5–15.5)
WBC: 8.3 10*3/uL (ref 4.0–10.5)
nRBC: 0 % (ref 0.0–0.2)

## 2022-11-08 LAB — PROTIME-INR
INR: 1 (ref 0.8–1.2)
Prothrombin Time: 13.4 seconds (ref 11.4–15.2)

## 2022-11-08 LAB — TROPONIN I (HIGH SENSITIVITY): Troponin I (High Sensitivity): 10 ng/L (ref <18)

## 2022-11-08 MED ORDER — CLONIDINE HCL 0.1 MG PO TABS
0.1000 mg | ORAL_TABLET | Freq: Once | ORAL | Status: AC
  Administered 2022-11-08: 0.1 mg via ORAL
  Filled 2022-11-08: qty 1

## 2022-11-08 NOTE — ED Provider Notes (Signed)
[facility] Provider Note    Event Date/Time   First MD Initiated Contact with Patient 11/08/22 2155     (approximate)   History   Fall   HPI  Eric Barron is a 72 y.o. male  who presents to the emergency department today after a fall. The patient typically walks everyday. He says that he got his foot caught in some gravel. He does have history of stroke and has had issues with dragging his foot ever since. The patient states that he hurt his right knee and face.  He denies feeling sick at all today.  States that he has been in his normal state of health.      Physical Exam   Triage Vital Signs: ED Triage Vitals  Enc Vitals Group     BP 11/08/22 1859 (!) 224/97     Pulse Rate 11/08/22 1859 65     Resp 11/08/22 1859 16     Temp 11/08/22 1859 97.7 F (36.5 C)     Temp Source 11/08/22 1859 Oral     SpO2 11/08/22 1859 94 %     Weight 11/08/22 1900 170 lb (77.1 kg)     Height 11/08/22 1900 5' 9"$  (1.753 m)     Head Circumference --      Peak Flow --      Pain Score 11/08/22 1907 2     Pain Loc --      Pain Edu? --      Excl. in [place]? --     Most recent vital signs: Vitals:   11/08/22 1859 11/08/22 2148  BP: (!) 224/97 (!) 219/81  Pulse: 65   Resp: 16   Temp: 97.7 F (36.5 C)   SpO2: 94%    General: Awake, alert, oriented.  CV:  Good peripheral perfusion. Regular rate and rhythm.  Resp:  Normal effort. Lungs clear. Abd:  No distention.  Other:  Abrasion to right knee. Swelling and abrasion to left cheek and forehead.   ED Results / Procedures / Treatments   Labs (all labs ordered are listed, but only abnormal results are displayed) Labs Reviewed  BASIC METABOLIC PANEL - Abnormal; Notable for the following components:      Result Value   Glucose, Bld 107 (*)    All other components within normal limits  CBC  PROTIME-INR  TROPONIN I (HIGH SENSITIVITY)  TROPONIN I (HIGH SENSITIVITY)     EKG  I, Nance Pear, attending  physician, personally viewed and interpreted this EKG  EKG Time: 1907 Rate: 61 Rhythm: normal sinus rhythm Axis: left axis deviation Intervals: qtc 461 QRS: RBBB ST changes: no st elevation Impression: abnormal ekg   RADIOLOGY I independently interpreted and visualized the CT head/cervical spine. My interpretation: No intracranial bleed, no fracture Radiology interpretation:  IMPRESSION:  1. No CT evidence of intracranial injury.  2. Soft tissue hematoma in the pre maxillary soft tissues on the  left.  3. No acute cervical spine fracture.  4. 1.7 cm left thyroid nodule. Recommend further evaluation with  dedicated thyroid ultrasound on a nonemergent basis.    I independently interpreted and visualized the right knee. My interpretation: No acute osseous abnormality Radiology interpretation:  IMPRESSION:  Degenerative change without acute abnormality.     PROCEDURES:  Critical Care performed: No  Procedures   MEDICATIONS ORDERED IN ED: Medications  cloNIDine (CATAPRES) tablet 0.1 mg (has no administration in time range)     IMPRESSION / MDM /  ASSESSMENT AND PLAN / ED COURSE  I reviewed the triage vital signs and the nursing notes.                              Differential diagnosis includes, but is not limited to, fracture, intracranial bleed, dislocation.  Patient's presentation is most consistent with acute presentation with potential threat to life or bodily function.   Patient presented to the emergency department today because of concerns for mechanical fall.  Patient states that he tripped on gravel.  On exam he does have abrasions to the right and left side of his face.  Imaging without any acute fractures or bleed.  Did show a thyroid nodule which I discussed with family and patient.  Additionally patient's blood pressure was elevated here.  He was given medication which helped decrease his blood pressure.  Did discuss with patient and family importance of  monitoring blood pressure at home and following up with primary care.   FINAL CLINICAL IMPRESSION(S) / ED DIAGNOSES   Final diagnoses:  Fall, initial encounter  Hypertension, unspecified type     Note:  This document was prepared using Dragon voice recognition software and may include unintentional dictation errors.    Nance Pear, MD 11/08/22 [phone]

## 2022-11-08 NOTE — Discharge Instructions (Addendum)
Please follow up with your doctor to have further imaging of your thyroid. Please seek medical attention for any high fevers, chest pain, shortness of breath, change in behavior, persistent vomiting, bloody stool or any other new or concerning symptoms.

## 2022-11-08 NOTE — ED Triage Notes (Signed)
Pt to ED from home c/o mechanical fall tonight.  States was walking down sidewalk and stepped off of curb and miss stepped.  Landing on right knee and hit left forehead, denies LOC.  On blood thinners.  Pt A&Ox4, chest rise even and unlabored, in NAD at this time.

## 2022-11-08 NOTE — ED Notes (Signed)
Pt dc to home. DC instructions reviewed with all questions answered. Pt voices understanding. Pt ambulatory out of dept with steady gait

## 2022-11-16 ENCOUNTER — Telehealth: Payer: Self-pay

## 2022-11-16 NOTE — Telephone Encounter (Signed)
Patient wife called stating that he fell and went to the ED, they told him he has a nodule in his throat and want to know what's next, would you like pt to make appt>

## 2022-11-30 ENCOUNTER — Ambulatory Visit: Payer: No Typology Code available for payment source | Admitting: Nurse Practitioner

## 2022-12-03 ENCOUNTER — Encounter: Payer: Self-pay | Admitting: Internal Medicine

## 2022-12-03 ENCOUNTER — Ambulatory Visit (INDEPENDENT_AMBULATORY_CARE_PROVIDER_SITE_OTHER): Payer: No Typology Code available for payment source | Admitting: Nurse Practitioner

## 2022-12-03 VITALS — BP 120/82 | HR 68 | Ht 69.0 in | Wt 176.2 lb

## 2022-12-03 DIAGNOSIS — E782 Mixed hyperlipidemia: Secondary | ICD-10-CM

## 2022-12-03 DIAGNOSIS — E041 Nontoxic single thyroid nodule: Secondary | ICD-10-CM | POA: Diagnosis not present

## 2022-12-03 DIAGNOSIS — R7303 Prediabetes: Secondary | ICD-10-CM | POA: Diagnosis not present

## 2022-12-03 DIAGNOSIS — I10 Essential (primary) hypertension: Secondary | ICD-10-CM | POA: Diagnosis not present

## 2022-12-03 DIAGNOSIS — I63512 Cerebral infarction due to unspecified occlusion or stenosis of left middle cerebral artery: Secondary | ICD-10-CM

## 2022-12-03 NOTE — Progress Notes (Signed)
Established Patient Office Visit  Subjective:  Patient ID: Eric Barron, male    DOB: Mar 28, 1951  Age: 72 y.o. MRN: [id number]  Chief Complaint  Patient presents with   [facility] follow up    Patient comes in with his wife for a follow-up of his emergency room visit on 11/08/2022.  He was taken in with a history of fall where he says he tripped and  suffered abrasion of left cheek and right knee.  In the emergency room CT scan of his head and cervical spine was done which was unremarkable for any bleed but a  1.7 cm nodule in the left thyroid lobe was noticed.  Patient has been advised to get a follow-up  ultrasound of his thyroid nodule. Patient today feels well, he has no complaints whatsoever.  His facial bruising has cleared up nicely.  And his blood pressure is also under good control today. Will get blood work today as well as order a thyroid ultrasound.    No other concerns at this time.   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)    CVA, old, dysarthria    HTN (hypertension)     Past Surgical History:  Procedure Laterality Date   KNEE SURGERY Right     Social History   Socioeconomic History   Marital status: Married    Spouse name: Debbie   Number of children: Not on file   Years of education: Not on file   Highest education level: Not on file  Occupational History   Not on file  Tobacco Use   Smoking status: Former    Packs/day: 1.00    Years: 6.00    Additional pack years: 0.00    Total pack years: 6.00    Types: Cigarettes    Start date: 12/18/1993    Quit date: 08/19/2000    Years since quitting: 22.3   Smokeless tobacco: Never   Tobacco comments:    pt unsure about dates  Vaping Use   Vaping Use: Unknown  Substance and Sexual Activity   Alcohol use: Never   Drug use: Never   Sexual activity: Not on file  Other Topics Concern   Not on file  Social History  Narrative   Not on file   Social Determinants of Health   Financial Resource Strain: Not on file  Food Insecurity: Not on file  Transportation Needs: Not on file  Physical Activity: Not on file  Stress: Not on file  Social Connections: Not on file  Intimate Partner Violence: Not on file    No family history on file.  No Known Allergies  Review of Systems  Constitutional:  Negative for chills, diaphoresis, fever, malaise/fatigue and weight loss.  HENT:  Negative for congestion, ear discharge, ear pain, hearing loss, nosebleeds, sinus pain and tinnitus.   Eyes:  Negative for blurred vision, double vision, photophobia and pain.  Respiratory:  Negative for cough, shortness of breath and wheezing.   Cardiovascular:  Negative for chest pain, palpitations, orthopnea, claudication and leg swelling.  Gastrointestinal:  Negative for abdominal pain, blood in stool, constipation, diarrhea, heartburn, nausea and vomiting.  Genitourinary:  Negative for dysuria, frequency and urgency.  Musculoskeletal:  Negative for back pain, joint pain, myalgias and neck pain.  Skin:  Negative for itching and rash.  Neurological:  Positive for weakness (history of stroke). Negative for dizziness, tingling and  headaches.  Psychiatric/Behavioral: Negative.         Objective:   BP 120/82   Pulse 68   Ht 5\' 9"  (1.753 m)   Wt 176 lb 3.2 oz (79.9 kg)   SpO2 96%   BMI 26.02 kg/m   Vitals:   12/03/22 1513  BP: 120/82  Pulse: 68  Height: 5\' 9"  (1.753 m)  Weight: 176 lb 3.2 oz (79.9 kg)  SpO2: 96%  BMI (Calculated): 26.01    Physical Exam Vitals and nursing note reviewed.  Constitutional:      Appearance: Normal appearance.  HENT:     Head: Normocephalic.  Neck:     Comments: Left Thyroid nodule- Cardiovascular:     Rate and Rhythm: Normal rate and regular rhythm.     Pulses: Normal pulses.     Heart sounds: Normal heart sounds.  Pulmonary:     Effort: Pulmonary effort is normal.   Abdominal:     General: Abdomen is flat.     Palpations: Abdomen is soft.  Musculoskeletal:        General: Normal range of motion.     Cervical back: Normal range of motion and neck supple.  Skin:    General: Skin is warm.  Neurological:     General: No focal deficit present.     Mental Status: He is alert and oriented to person, place, and time.      No results found for any visits on 12/03/22.  Recent Results (from the past 2160 hour(s))  Basic metabolic panel     Status: Abnormal   Collection Time: 11/08/22  7:15 PM  Result Value Ref Range   Sodium 141 135 - 145 mmol/L   Potassium 3.6 3.5 - 5.1 mmol/L   Chloride 107 98 - 111 mmol/L   CO2 25 22 - 32 mmol/L   Glucose, Bld 107 (H) 70 - 99 mg/dL    Comment: Glucose reference range applies only to samples taken after fasting for at least 8 hours.   BUN 22 8 - 23 mg/dL   Creatinine, Ser 1.02 0.61 - 1.24 mg/dL   Calcium 8.9 8.9 - 10.3 mg/dL   GFR, Estimated >60 >60 mL/min    Comment: (NOTE) Calculated using the CKD-EPI Creatinine Equation (2021)    Anion gap 9 5 - 15    Comment: Performed at [facility], [address] [place], [place] [address]  CBC     Status: None   Collection Time: 11/08/22  7:15 PM  Result Value Ref Range   WBC 8.3 4.0 - 10.5 K/uL   RBC 4.97 4.22 - 5.81 MIL/uL   Hemoglobin 14.4 13.0 - 17.0 g/dL   HCT 44.6 39.0 - 52.0 %   MCV 89.7 80.0 - 100.0 fL   MCH 29.0 26.0 - 34.0 pg   MCHC 32.3 30.0 - 36.0 g/dL   RDW 13.6 11.5 - 15.5 %   Platelets 170 150 - 400 K/uL   nRBC 0.0 0.0 - 0.2 %    Comment: Performed at [facility], [place], [place] [address]  Troponin I (High Sensitivity)     Status: None   Collection Time: 11/08/22  7:15 PM  Result Value Ref Range   Troponin I (High Sensitivity) 10 <18 ng/L    Comment: (NOTE) Elevated high sensitivity troponin I (hsTnI) values and significant  changes across serial measurements may suggest ACS but many other  chronic  and acute conditions are known to elevate hsTnI  results.  Refer to the "Links" section for chest pain algorithms and additional  guidance. Performed at [facility], [address] [place], [place] [address]   Protime-INR (order if Patient is taking Coumadin / Warfarin)     Status: None   Collection Time: 11/08/22  7:15 PM  Result Value Ref Range   Prothrombin Time 13.4 11.4 - 15.2 seconds   INR 1.0 0.8 - 1.2    Comment: (NOTE) INR goal varies based on device and disease states. Performed at [facility], [address], [place], [place] [address]       Assessment & Plan:  Schedule thyroid ultrasound for left thyroid nodule.  Patient will come back to discuss the results. Problem List Items Addressed This Visit     CVA (cerebral vascular accident) ([place])   Relevant Orders   CBC With Differential   Essential hypertension   Relevant Orders   CMP14+EGFR   CBC With Differential   Hyperlipidemia   Relevant Orders   Lipid Panel w/o Chol/HDL Ratio   Prediabetes   Relevant Orders   Hemoglobin A1c   Other Visit Diagnoses     Thyroid nodule    -  Primary   Relevant Orders   US THYROID   TSH+T4F+T3Free       Return in about 10 days (around 12/13/2022).   Total time spent: 30 minutes  Perrin Maltese, MD  12/03/2022

## 2022-12-04 LAB — CMP14+EGFR
ALT: 16 IU/L (ref 0–44)
AST: 16 IU/L (ref 0–40)
Albumin/Globulin Ratio: 1.6 (ref 1.2–2.2)
Albumin: 4.1 g/dL (ref 3.9–4.9)
Alkaline Phosphatase: 110 IU/L (ref 44–121)
BUN/Creatinine Ratio: 11 (ref 10–24)
BUN: 11 mg/dL (ref 8–27)
Bilirubin Total: 0.4 mg/dL (ref 0.0–1.2)
CO2: 24 mmol/L (ref 20–29)
Calcium: 9.3 mg/dL (ref 8.6–10.2)
Chloride: 103 mmol/L (ref 96–106)
Creatinine, Ser: 0.99 mg/dL (ref 0.76–1.27)
Globulin, Total: 2.5 g/dL (ref 1.5–4.5)
Glucose: 91 mg/dL (ref 70–99)
Potassium: 5.1 mmol/L (ref 3.5–5.2)
Sodium: 142 mmol/L (ref 134–144)
Total Protein: 6.6 g/dL (ref 6.0–8.5)
eGFR: 82 mL/min/{1.73_m2} (ref >59)

## 2022-12-04 LAB — CBC WITH DIFFERENTIAL
Basophils Absolute: 0.1 10*3/uL (ref 0.0–0.2)
Basos: 1 %
EOS (ABSOLUTE): 0.2 10*3/uL (ref 0.0–0.4)
Eos: 3 %
Hematocrit: 42.4 % (ref 37.5–51.0)
Hemoglobin: 14.2 g/dL (ref 13.0–17.7)
Immature Grans (Abs): 0 10*3/uL (ref 0.0–0.1)
Immature Granulocytes: 0 %
Lymphocytes Absolute: 2.3 10*3/uL (ref 0.7–3.1)
Lymphs: 28 %
MCH: 29.2 pg (ref 26.6–33.0)
MCHC: 33.5 g/dL (ref 31.5–35.7)
MCV: 87 fL (ref 79–97)
Monocytes Absolute: 0.6 10*3/uL (ref 0.1–0.9)
Monocytes: 8 %
Neutrophils Absolute: 5.1 10*3/uL (ref 1.4–7.0)
Neutrophils: 60 %
RBC: 4.86 x10E6/uL (ref 4.14–5.80)
RDW: 12.8 % (ref 11.6–15.4)
WBC: 8.3 10*3/uL (ref 3.4–10.8)

## 2022-12-04 LAB — TSH+T4F+T3FREE
Free T4: 1.17 ng/dL (ref 0.82–1.77)
T3, Free: 3.4 pg/mL (ref 2.0–4.4)
TSH: 1.43 u[IU]/mL (ref 0.450–4.500)

## 2022-12-04 LAB — LIPID PANEL W/O CHOL/HDL RATIO
Cholesterol, Total: 137 mg/dL (ref 100–199)
HDL: 57 mg/dL (ref >39)
LDL Chol Calc (NIH): 63 mg/dL (ref 0–99)
Triglycerides: 89 mg/dL (ref 0–149)
VLDL Cholesterol Cal: 17 mg/dL (ref 5–40)

## 2022-12-04 LAB — HEMOGLOBIN A1C
Est. average glucose Bld gHb Est-mCnc: 131 mg/dL
Hgb A1c MFr Bld: 6.2 % — ABNORMAL HIGH (ref 4.8–5.6)

## 2022-12-07 ENCOUNTER — Ambulatory Visit
Admission: RE | Admit: 2022-12-07 | Discharge: 2022-12-07 | Disposition: A | Discharge status: Home / Self Care | Payer: No Typology Code available for payment source | Source: Ambulatory Visit | Attending: Internal Medicine | Admitting: Internal Medicine

## 2022-12-07 DIAGNOSIS — E041 Nontoxic single thyroid nodule: Secondary | ICD-10-CM | POA: Diagnosis present

## 2022-12-10 ENCOUNTER — Other Ambulatory Visit: Payer: Self-pay | Admitting: Internal Medicine

## 2022-12-10 DIAGNOSIS — E042 Nontoxic multinodular goiter: Secondary | ICD-10-CM

## 2022-12-13 ENCOUNTER — Ambulatory Visit: Payer: No Typology Code available for payment source | Admitting: Nurse Practitioner

## 2022-12-17 ENCOUNTER — Ambulatory Visit: Payer: No Typology Code available for payment source | Admitting: Nurse Practitioner

## 2022-12-20 ENCOUNTER — Ambulatory Visit: Payer: No Typology Code available for payment source | Admitting: Nurse Practitioner

## 2022-12-20 VITALS — BP 132/70 | HR 68 | Ht 69.0 in | Wt 173.4 lb

## 2022-12-20 DIAGNOSIS — I10 Essential (primary) hypertension: Secondary | ICD-10-CM | POA: Diagnosis not present

## 2022-12-20 DIAGNOSIS — R7303 Prediabetes: Secondary | ICD-10-CM

## 2022-12-20 DIAGNOSIS — E782 Mixed hyperlipidemia: Secondary | ICD-10-CM

## 2022-12-20 DIAGNOSIS — E041 Nontoxic single thyroid nodule: Secondary | ICD-10-CM | POA: Diagnosis not present

## 2022-12-20 NOTE — Progress Notes (Signed)
Established Patient Office Visit  Subjective:  Patient ID: Eric Barron, male    DOB: 26-Mar-1951  Age: 72 y.o. MRN: [id number]  Chief Complaint  Patient presents with   Follow-up    10 day follow up    1 week follow up, discussed thyroid nodule that is suspicious on thyroid US recently.  Will send to Endocrinology as FNA is recommended.  Patient is having no difficulty with swallowing solids or liquids.  Patient does clear throat often and coughs often.    No other concerns at this time.   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)    CVA, old, dysarthria    HTN (hypertension)     Past Surgical History:  Procedure Laterality Date   KNEE SURGERY Right     Social History   Socioeconomic History   Marital status: Married    Spouse name: Debbie   Number of children: Not on file   Years of education: Not on file   Highest education level: Not on file  Occupational History   Not on file  Tobacco Use   Smoking status: Former    Packs/day: 1.00    Years: 6.00    Additional pack years: 0.00    Total pack years: 6.00    Types: Cigarettes    Start date: 12/18/1993    Quit date: 08/19/2000    Years since quitting: 22.3   Smokeless tobacco: Never   Tobacco comments:    pt unsure about dates  Vaping Use   Vaping Use: Unknown  Substance and Sexual Activity   Alcohol use: Never   Drug use: Never   Sexual activity: Not on file  Other Topics Concern   Not on file  Social History Narrative   Not on file   Social Determinants of Health   Financial Resource Strain: Not on file  Food Insecurity: Not on file  Transportation Needs: Not on file  Physical Activity: Not on file  Stress: Not on file  Social Connections: Not on file  Intimate Partner Violence: Not on file    No family history on file.  Allergies  Allergen Reactions   Lipitor [Atorvastatin]     Review of Systems   Constitutional: Negative.   HENT: Negative.    Eyes: Negative.   Respiratory: Negative.    Cardiovascular: Negative.   Gastrointestinal: Negative.   Genitourinary: Negative.   Musculoskeletal: Negative.   Skin: Negative.   Neurological: Negative.   Endo/Heme/Allergies: Negative.   Psychiatric/Behavioral: Negative.         Objective:   BP 132/70   Pulse 68   Ht 5\' 9"  (1.753 m)   Wt 173 lb 6.4 oz (78.7 kg)   SpO2 95%   BMI 25.61 kg/m   Vitals:   12/20/22 1054  BP: 132/70  Pulse: 68  Height: 5\' 9"  (1.753 m)  Weight: 173 lb 6.4 oz (78.7 kg)  SpO2: 95%  BMI (Calculated): 25.6    Physical Exam Vitals reviewed.  Constitutional:      Appearance: Normal appearance.  HENT:     Head: Normocephalic.     Nose: Nose normal.     Mouth/Throat:     Mouth: Mucous membranes are moist.  Eyes:     Pupils: Pupils are equal, round, and reactive to light.  Cardiovascular:     Rate and Rhythm: Normal rate and regular rhythm.  Pulmonary:  Effort: Pulmonary effort is normal.     Breath sounds: Normal breath sounds.  Abdominal:     General: Bowel sounds are normal.     Palpations: Abdomen is soft.  Musculoskeletal:        General: Normal range of motion.     Cervical back: Normal range of motion and neck supple.  Skin:    General: Skin is warm and dry.  Neurological:     Mental Status: He is alert and oriented to person, place, and time.  Psychiatric:        Mood and Affect: Mood normal.        Behavior: Behavior normal.      No results found for any visits on 12/20/22.  Recent Results (from the past 2160 hour(s))  Basic metabolic panel     Status: Abnormal   Collection Time: 11/08/22  7:15 PM  Result Value Ref Range   Sodium 141 135 - 145 mmol/L   Potassium 3.6 3.5 - 5.1 mmol/L   Chloride 107 98 - 111 mmol/L   CO2 25 22 - 32 mmol/L   Glucose, Bld 107 (H) 70 - 99 mg/dL    Comment: Glucose reference range applies only to samples taken after fasting for at least  8 hours.   BUN 22 8 - 23 mg/dL   Creatinine, Ser 1.02 0.61 - 1.24 mg/dL   Calcium 8.9 8.9 - 10.3 mg/dL   GFR, Estimated >60 >60 mL/min    Comment: (NOTE) Calculated using the CKD-EPI Creatinine Equation (2021)    Anion gap 9 5 - 15    Comment: Performed at [facility], [address] [place], [place] [address]  CBC     Status: None   Collection Time: 11/08/22  7:15 PM  Result Value Ref Range   WBC 8.3 4.0 - 10.5 K/uL   RBC 4.97 4.22 - 5.81 MIL/uL   Hemoglobin 14.4 13.0 - 17.0 g/dL   HCT 44.6 39.0 - 52.0 %   MCV 89.7 80.0 - 100.0 fL   MCH 29.0 26.0 - 34.0 pg   MCHC 32.3 30.0 - 36.0 g/dL   RDW 13.6 11.5 - 15.5 %   Platelets 170 150 - 400 K/uL   nRBC 0.0 0.0 - 0.2 %    Comment: Performed at [facility], [place], [place] [address]  Troponin I (High Sensitivity)     Status: None   Collection Time: 11/08/22  7:15 PM  Result Value Ref Range   Troponin I (High Sensitivity) 10 <18 ng/L    Comment: (NOTE) Elevated high sensitivity troponin I (hsTnI) values and significant  changes across serial measurements may suggest ACS but many other  chronic and acute conditions are known to elevate hsTnI results.  Refer to the "Links" section for chest pain algorithms and additional  guidance. Performed at [facility], [address] [place], [place] [address]   Protime-INR (order if Patient is taking Coumadin / Warfarin)     Status: None   Collection Time: 11/08/22  7:15 PM  Result Value Ref Range   Prothrombin Time 13.4 11.4 - 15.2 seconds   INR 1.0 0.8 - 1.2    Comment: (NOTE) INR goal varies based on device and disease states. Performed at [facility], [address] [place], [place] [address]   [id number]     Status: None   Collection Time: 12/03/22  3:59 PM  Result Value Ref Range   Glucose 91 70 -  99 mg/dL   BUN 11 8 - 27 mg/dL   Creatinine, Ser 0.99 0.76 - 1.27 mg/dL   eGFR 82 >59 mL/min/1.73   BUN/Creatinine  Ratio 11 10 - 24   Sodium 142 134 - 144 mmol/L   Potassium 5.1 3.5 - 5.2 mmol/L   Chloride 103 96 - 106 mmol/L   CO2 24 20 - 29 mmol/L   Calcium 9.3 8.6 - 10.2 mg/dL   Total Protein 6.6 6.0 - 8.5 g/dL   Albumin 4.1 3.9 - 4.9 g/dL   Globulin, Total 2.5 1.5 - 4.5 g/dL   Albumin/Globulin Ratio 1.6 1.2 - 2.2   Bilirubin Total 0.4 0.0 - 1.2 mg/dL   Alkaline Phosphatase 110 44 - 121 IU/L   AST 16 0 - 40 IU/L   ALT 16 0 - 44 IU/L  Lipid Panel w/o Chol/HDL Ratio     Status: None   Collection Time: 12/03/22  3:59 PM  Result Value Ref Range   Cholesterol, Total 137 100 - 199 mg/dL   Triglycerides 89 0 - 149 mg/dL   HDL 57 >39 mg/dL   VLDL Cholesterol Cal 17 5 - 40 mg/dL   LDL Chol Calc (NIH) 63 0 - 99 mg/dL  CBC With Differential     Status: None   Collection Time: 12/03/22  3:59 PM  Result Value Ref Range   WBC 8.3 3.4 - 10.8 x10E3/uL   RBC 4.86 4.14 - 5.80 x10E6/uL   Hemoglobin 14.2 13.0 - 17.7 g/dL   Hematocrit 42.4 37.5 - 51.0 %   MCV 87 79 - 97 fL   MCH 29.2 26.6 - 33.0 pg   MCHC 33.5 31.5 - 35.7 g/dL   RDW 12.8 11.6 - 15.4 %   Neutrophils 60 Not Estab. %   Lymphs 28 Not Estab. %   Monocytes 8 Not Estab. %   Eos 3 Not Estab. %   Basos 1 Not Estab. %   Neutrophils Absolute 5.1 1.4 - 7.0 x10E3/uL   Lymphocytes Absolute 2.3 0.7 - 3.1 x10E3/uL   Monocytes Absolute 0.6 0.1 - 0.9 x10E3/uL   EOS (ABSOLUTE) 0.2 0.0 - 0.4 x10E3/uL   Basophils Absolute 0.1 0.0 - 0.2 x10E3/uL   Immature Granulocytes 0 Not Estab. %   Immature Grans (Abs) 0.0 0.0 - 0.1 x10E3/uL  Hemoglobin A1c     Status: Abnormal   Collection Time: 12/03/22  3:59 PM  Result Value Ref Range   Hgb A1c MFr Bld 6.2 (H) 4.8 - 5.6 %    Comment:          Prediabetes: 5.7 - 6.4          Diabetes: >6.4          Glycemic control for adults with diabetes: <7.0    Est. average glucose Bld gHb Est-mCnc 131 mg/dL  TSH+T4F+T3Free     Status: None   Collection Time: 12/03/22  3:59 PM  Result Value Ref Range   TSH 1.430 0.450  - 4.500 uIU/mL   T3, Free 3.4 2.0 - 4.4 pg/mL   Free T4 1.17 0.82 - 1.77 ng/dL      Assessment & Plan:   Problem List Items Addressed This Visit       Cardiovascular and Mediastinum   Essential hypertension   Relevant Medications   cloNIDine (CATAPRES) 0.1 MG tablet   rosuvastatin (CRESTOR) 40 MG tablet   lisinopril (ZESTRIL) 40 MG tablet     Endocrine   Thyroid nodule - Primary  Relevant Orders   Ambulatory referral to Endocrinology     Other   Hyperlipidemia   Relevant Medications   cloNIDine (CATAPRES) 0.1 MG tablet   rosuvastatin (CRESTOR) 40 MG tablet   lisinopril (ZESTRIL) 40 MG tablet   Prediabetes    Return in about 3 months (around 03/21/2023).   Total time spent: 35 minutes  Evern Bio, NP  12/20/2022

## 2022-12-20 NOTE — Patient Instructions (Signed)
1) Endocrinology referral for susp left thyroid nodule found on [place] 2) Follow up appt in 3 months, fasting labs prior

## 2022-12-26 ENCOUNTER — Other Ambulatory Visit: Payer: Self-pay | Admitting: Family

## 2022-12-26 ENCOUNTER — Other Ambulatory Visit: Payer: Self-pay | Admitting: Internal Medicine

## 2022-12-26 DIAGNOSIS — E041 Nontoxic single thyroid nodule: Secondary | ICD-10-CM

## 2022-12-28 NOTE — Progress Notes (Signed)
Patient for Eric Barron guided thyroid nodule biopsy on Mon 12/31/2022, I called and spoke with the patient's wife on the phone and gave pre-procedure instructions. Pt's wife was made aware to have the patient here at 2p at the [facility] registration desk. Pt's wife stated understanding.  Called 12/27/2022

## 2022-12-31 ENCOUNTER — Ambulatory Visit
Admission: RE | Admit: 2022-12-31 | Discharge: 2022-12-31 | Disposition: A | Discharge status: Home / Self Care | Payer: No Typology Code available for payment source | Source: Ambulatory Visit | Attending: Family | Admitting: Family

## 2022-12-31 DIAGNOSIS — E041 Nontoxic single thyroid nodule: Secondary | ICD-10-CM | POA: Diagnosis present

## 2022-12-31 MED ORDER — LIDOCAINE HCL (PF) 1 % IJ SOLN
10.0000 mL | Freq: Once | INTRAMUSCULAR | Status: AC
  Administered 2022-12-31: 10 mL via INTRADERMAL
  Filled 2022-12-31: qty 10

## 2022-12-31 NOTE — Procedures (Signed)
Successful US guided FNA of left inferior thyroid nodule No complications. See PACS for full report.    Alex Gardener, AGNP-BC 12/31/2022, 3:23 PM

## 2023-01-01 ENCOUNTER — Telehealth: Payer: Self-pay | Admitting: Nurse Practitioner

## 2023-01-01 LAB — CYTOLOGY - NON PAP

## 2023-01-01 NOTE — Telephone Encounter (Signed)
Patient's wife called wanting results of thyroid biopsy that was done yesterday. They seen the results in MyChart but don't understand the medical terminology. They are very anxious for results as they are afraid this may be cancer. Please advise.

## 2023-01-02 ENCOUNTER — Other Ambulatory Visit: Payer: Self-pay | Admitting: Nurse Practitioner

## 2023-01-02 DIAGNOSIS — E041 Nontoxic single thyroid nodule: Secondary | ICD-10-CM

## 2023-01-09 ENCOUNTER — Ambulatory Visit (INDEPENDENT_AMBULATORY_CARE_PROVIDER_SITE_OTHER): Payer: No Typology Code available for payment source | Admitting: Family

## 2023-01-09 ENCOUNTER — Encounter: Payer: Self-pay | Admitting: Family

## 2023-01-09 VITALS — BP 138/78 | HR 57 | Ht 68.0 in | Wt 172.8 lb

## 2023-01-09 DIAGNOSIS — E041 Nontoxic single thyroid nodule: Secondary | ICD-10-CM

## 2023-01-10 NOTE — Progress Notes (Signed)
[name], DO sent to Eric Barron OK for repeat US guided FNA of LT inferior thyroid nodule.    Eric Barron       Previous Messages    ----- Message ----- From: Eric Barron Sent: 01/10/2023   2:47 PM EDT To: Ir Procedure Requests Subject: US Biopsy                                      IR Approval Request:   Procedure:   REPEAT    -   US guided FNA LT inferior thyroid nodule biopsy  Reason:      enlarged LT inferior thyroid nodule    History:       US thyroid  12/07/2022     [place] FNA thyroid nodule biopsy    12/31/2022  Provider:     Miki Kins, FNP  Provider Contact #     [facility]       [phone]

## 2023-01-11 NOTE — Progress Notes (Signed)
Patient for Eric Barron guided FNA LT Inferior Thyroid Nodule Biopsy on Mon 01/14/2023, I called and spoke with the patient on the phone and gave pre-procedure instructions. Pt was made aware to be here at 2p and check in at the [facility] registration desk. Pt stated understanding.  Called   01/10/2023

## 2023-01-13 ENCOUNTER — Encounter: Payer: Self-pay | Admitting: Family

## 2023-01-13 NOTE — Assessment & Plan Note (Signed)
Sending an order for the repeat biopsy.  Patient is aware that they will call to schedule this with him as soon as possible.  I have also asked him/his wife to call Dr. Gregery Na office to set up the referral.   Once we have biopsy completed and have results, will send to Dr. Patrecia Pace.

## 2023-01-13 NOTE — Progress Notes (Signed)
Established Patient Office Visit  Subjective:  Patient ID: Eric Barron, male    DOB: 1951/01/22  Age: 72 y.o. MRN: [id number]  Chief Complaint  Patient presents with   Follow-up    Discuss thyroid U/S results    Patient and his wife are here today to discuss the results of his recent thyroid biopsy.  The biopsy was inconclusive, as the sample that was obtained was largely fluid, and did not contain enough cells for the pathologist to determine the significance.  As a result, the biopsy needs to be repeated.   The patient did get a call from Dr. Patrecia Pace for endocrinology referral, but was unsure of exactly what they were setting up the referral for, so he declined at that time.    No other concerns at this time.   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)    CVA, old, dysarthria    HTN (hypertension)     Past Surgical History:  Procedure Laterality Date   KNEE SURGERY Right     Social History   Socioeconomic History   Marital status: Married    Spouse name: Debbie   Number of children: Not on file   Years of education: Not on file   Highest education level: Not on file  Occupational History   Not on file  Tobacco Use   Smoking status: Former    Packs/day: 1.00    Years: 6.00    Additional pack years: 0.00    Total pack years: 6.00    Types: Cigarettes    Start date: 12/18/1993    Quit date: 08/19/2000    Years since quitting: 22.4   Smokeless tobacco: Never   Tobacco comments:    pt unsure about dates  Vaping Use   Vaping Use: Unknown  Substance and Sexual Activity   Alcohol use: Never   Drug use: Never   Sexual activity: Not on file  Other Topics Concern   Not on file  Social History Narrative   Not on file   Social Determinants of Health   Financial Resource Strain: Not on file  Food Insecurity: Not on file  Transportation Needs: Not on file  Physical Activity: Not  on file  Stress: Not on file  Social Connections: Not on file  Intimate Partner Violence: Not on file    History reviewed. No pertinent family history.  Allergies  Allergen Reactions   Lipitor [Atorvastatin]     Review of Systems  Psychiatric/Behavioral:  The patient is nervous/anxious.   All other systems reviewed and are negative.      Objective:   BP 138/78   Pulse (!) 57   Ht 5\' 8"  (1.727 m)   Wt 172 lb 12.8 oz (78.4 kg)   SpO2 95%   BMI 26.27 kg/m   Vitals:   01/09/23 1305  BP: 138/78  Pulse: (!) 57  Height: 5\' 8"  (1.727 m)  Weight: 172 lb 12.8 oz (78.4 kg)  SpO2: 95%  BMI (Calculated): 26.28    Physical Exam Vitals and nursing note reviewed.  Constitutional:      Appearance: Normal appearance. He is normal weight.  Eyes:     Pupils: Pupils are equal, round, and reactive to light.  Cardiovascular:     Rate and Rhythm: Normal rate and regular rhythm.     Pulses: Normal pulses.     Heart sounds: Normal heart sounds.  Pulmonary:     Effort: Pulmonary effort is normal.     Breath sounds: Normal breath sounds.  Neurological:     Mental Status: He is alert.  Psychiatric:        Mood and Affect: Mood normal.        Behavior: Behavior normal.        Thought Content: Thought content normal.        Judgment: Judgment normal.      No results found for any visits on 01/09/23.  Recent Results (from the past 2160 hour(s))  Basic metabolic panel     Status: Abnormal   Collection Time: 11/08/22  7:15 PM  Result Value Ref Range   Sodium 141 135 - 145 mmol/L   Potassium 3.6 3.5 - 5.1 mmol/L   Chloride 107 98 - 111 mmol/L   CO2 25 22 - 32 mmol/L   Glucose, Bld 107 (H) 70 - 99 mg/dL    Comment: Glucose reference range applies only to samples taken after fasting for at least 8 hours.   BUN 22 8 - 23 mg/dL   Creatinine, Ser 1.61 0.61 - 1.24 mg/dL   Calcium 8.9 8.9 - 09.6 mg/dL   GFR, Estimated >04 >54 mL/min    Comment: (NOTE) Calculated using the  CKD-EPI Creatinine Equation (2021)    Anion gap 9 5 - 15    Comment: Performed at [facility], [address] [place], [place] [address]  CBC     Status: None   Collection Time: 11/08/22  7:15 PM  Result Value Ref Range   WBC 8.3 4.0 - 10.5 K/uL   RBC 4.97 4.22 - 5.81 MIL/uL   Hemoglobin 14.4 13.0 - 17.0 g/dL   HCT 91.4 78.2 - 95.6 %   MCV 89.7 80.0 - 100.0 fL   MCH 29.0 26.0 - 34.0 pg   MCHC 32.3 30.0 - 36.0 g/dL   RDW 21.3 08.6 - 57.8 %   Platelets 170 150 - 400 K/uL   nRBC 0.0 0.0 - 0.2 %    Comment: Performed at [facility], [address], [place], [place] [address]  Troponin I (High Sensitivity)     Status: None   Collection Time: 11/08/22  7:15 PM  Result Value Ref Range   Troponin I (High Sensitivity) 10 <18 ng/L    Comment: (NOTE) Elevated high sensitivity troponin I (hsTnI) values and significant  changes across serial measurements may suggest ACS but many other  chronic and acute conditions are known to elevate hsTnI results.  Refer to the "Links" section for chest pain algorithms and additional  guidance. Performed at [facility], [address] [place], [place] [address]   Protime-INR (order if Patient is taking Coumadin / Warfarin)     Status: None   Collection Time: 11/08/22  7:15 PM  Result Value Ref Range   Prothrombin Time 13.4 11.4 - 15.2 seconds   INR 1.0 0.8 - 1.2    Comment: (NOTE) INR goal varies based on device and disease states. Performed at [facility], [address] [place], [place] [address]   CMP14+EGFR     Status: None   Collection Time: 12/03/22  3:59 PM  Result Value Ref Range   Glucose 91 70 - 99 mg/dL   BUN 11 8 - 27 mg/dL   Creatinine, Ser 0.10 0.76 - 1.27 mg/dL   eGFR 82 >27 OZ/DGU/4.40   BUN/Creatinine Ratio 11 10 - 24   Sodium 142  134 - 144 mmol/L   Potassium 5.1 3.5 - 5.2 mmol/L   Chloride 103 96 - 106 mmol/L   CO2 24 20 - 29 mmol/L   Calcium 9.3 8.6 - 10.2 mg/dL   Total  Protein 6.6 6.0 - 8.5 g/dL   Albumin 4.1 3.9 - 4.9 g/dL   Globulin, Total 2.5 1.5 - 4.5 g/dL   Albumin/Globulin Ratio 1.6 1.2 - 2.2   Bilirubin Total 0.4 0.0 - 1.2 mg/dL   Alkaline Phosphatase 110 44 - 121 IU/L   AST 16 0 - 40 IU/L   ALT 16 0 - 44 IU/L  Lipid Panel w/o Chol/HDL Ratio     Status: None   Collection Time: 12/03/22  3:59 PM  Result Value Ref Range   Cholesterol, Total 137 100 - 199 mg/dL   Triglycerides 89 0 - 149 mg/dL   HDL 57 >95 mg/dL   VLDL Cholesterol Cal 17 5 - 40 mg/dL   LDL Chol Calc (NIH) 63 0 - 99 mg/dL  CBC With Differential     Status: None   Collection Time: 12/03/22  3:59 PM  Result Value Ref Range   WBC 8.3 3.4 - 10.8 x10E3/uL   RBC 4.86 4.14 - 5.80 x10E6/uL   Hemoglobin 14.2 13.0 - 17.7 g/dL   Hematocrit 28.4 13.2 - 51.0 %   MCV 87 79 - 97 fL   MCH 29.2 26.6 - 33.0 pg   MCHC 33.5 31.5 - 35.7 g/dL   RDW 44.0 10.2 - 72.5 %   Neutrophils 60 Not Estab. %   Lymphs 28 Not Estab. %   Monocytes 8 Not Estab. %   Eos 3 Not Estab. %   Basos 1 Not Estab. %   Neutrophils Absolute 5.1 1.4 - 7.0 x10E3/uL   Lymphocytes Absolute 2.3 0.7 - 3.1 x10E3/uL   Monocytes Absolute 0.6 0.1 - 0.9 x10E3/uL   EOS (ABSOLUTE) 0.2 0.0 - 0.4 x10E3/uL   Basophils Absolute 0.1 0.0 - 0.2 x10E3/uL   Immature Granulocytes 0 Not Estab. %   Immature Grans (Abs) 0.0 0.0 - 0.1 x10E3/uL  Hemoglobin A1c     Status: Abnormal   Collection Time: 12/03/22  3:59 PM  Result Value Ref Range   Hgb A1c MFr Bld 6.2 (H) 4.8 - 5.6 %    Comment:          Prediabetes: 5.7 - 6.4          Diabetes: >6.4          Glycemic control for adults with diabetes: <7.0    Est. average glucose Bld gHb Est-mCnc 131 mg/dL  DGU+Y4I+H4VQQV     Status: None   Collection Time: 12/03/22  3:59 PM  Result Value Ref Range   TSH 1.430 0.450 - 4.500 uIU/mL   T3, Free 3.4 2.0 - 4.4 pg/mL   Free T4 1.17 0.82 - 1.77 ng/dL  Cytology - Non PAP; left inferior thyroid nodule     Status: None   Collection Time: 12/31/22   4:07 PM  Result Value Ref Range   CYTOLOGY - NON GYN      CYTOLOGY - NON PAP CASE: [id number] PATIENT: Eric Barron Non-Gynecological Cytology Report     Specimen Submitted: A. 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 THYROID NODULE, LEFT INFERIOR; ULTRASOUND-GUIDED FNA: - NON-DIAGNOSTIC (BETHESDA I). - VIRTUALLY ACELLULAR SPECIMEN.  Specimen is adequate for interpretation.  GROSS  DESCRIPTION: A. Site: Left inferior thyroid Procedure: Ultrasound-guided FNA Cytotechnologist(s): Monico Blitz  Specimen material collected and submitted for: 8 diff Quik stained slides 8 Pap stained slides Material for ThyroSeq if needed, labeled with the patients name, date of birth, specimen site, and collection date ThinPrep: 1  Specimen description: Fixative: Cytoloyt Volume: Approximately 20 mL Color: Red Transparency: Slightly cloudy Tissue fragments present: Yes  RB 12/31/2022  Final Diagnosis performed by Elijah Birk, MD.    Electronically signed 01/01/2023 3:26:16PM The electronic signature indicates that the named Attending Pathologist has evaluated the specimen Technical component performed at [facility], [address], [place], [place] [address] Lab: [phone] Dir: Jolene Schimke, MD, MMM  Professional component performed at [facility], [address] [place], [place], [place] [address] Lab: [phone] Dir: Beryle Quant, MD        Assessment & Plan:   Problem List Items Addressed This Visit       Endocrine   Thyroid nodule - Primary    Sending an order for the repeat biopsy.  Patient is aware that they will call to schedule this with him as soon as possible.  I have also asked him/his wife to call Dr. Gregery Na office to set up the referral.   Once we have biopsy completed and have results, will send to Dr. Patrecia Pace.        Relevant Orders    [place] FNA BX THYROID 1ST LESION AFIRMA    Return as previously discussed., for F/U.   Total time spent: 30 minutes >50% of which was spent on counseling and/or coordination of care for patient.   Miki Kins, FNP  01/09/2023

## 2023-01-14 ENCOUNTER — Ambulatory Visit
Admission: RE | Admit: 2023-01-14 | Discharge: 2023-01-14 | Disposition: A | Discharge status: Home / Self Care | Payer: No Typology Code available for payment source | Source: Ambulatory Visit | Attending: Family | Admitting: Family

## 2023-01-14 DIAGNOSIS — E042 Nontoxic multinodular goiter: Secondary | ICD-10-CM | POA: Insufficient documentation

## 2023-01-14 DIAGNOSIS — E041 Nontoxic single thyroid nodule: Secondary | ICD-10-CM

## 2023-01-14 MED ORDER — LIDOCAINE HCL (PF) 1 % IJ SOLN
9.0000 mL | Freq: Once | INTRAMUSCULAR | Status: AC
  Administered 2023-01-14: 9 mL via INTRADERMAL

## 2023-01-14 NOTE — Discharge Instructions (Signed)
Discharge instructions reviewed with patient.

## 2023-01-15 LAB — CYTOLOGY - NON PAP

## 2023-01-21 NOTE — Progress Notes (Signed)
Patient called on 01/14/23 to inform of results.

## 2023-02-07 ENCOUNTER — Telehealth: Payer: Self-pay | Admitting: Family

## 2023-02-07 ENCOUNTER — Other Ambulatory Visit: Payer: No Typology Code available for payment source

## 2023-02-07 NOTE — Telephone Encounter (Signed)
Patient's wife called and said he has not seen the endocrinologist yet - does he still need to come to his appt with you tomorrow?

## 2023-02-08 ENCOUNTER — Ambulatory Visit: Payer: No Typology Code available for payment source | Admitting: Family

## 2023-02-13 ENCOUNTER — Ambulatory Visit: Payer: No Typology Code available for payment source | Admitting: "Endocrinology

## 2023-02-28 ENCOUNTER — Telehealth: Payer: Self-pay | Admitting: Family

## 2023-02-28 NOTE — Telephone Encounter (Signed)
Joy from a medication adherence program called and left VM to reach out to patient about his adherence on his lisinopril.

## 2023-03-25 ENCOUNTER — Ambulatory Visit: Payer: No Typology Code available for payment source | Admitting: Nurse Practitioner

## 2023-04-01 ENCOUNTER — Other Ambulatory Visit: Payer: Self-pay | Admitting: Nurse Practitioner

## 2023-07-11 ENCOUNTER — Ambulatory Visit: Payer: No Typology Code available for payment source | Admitting: Family

## 2023-07-11 VITALS — BP 180/90 | HR 54 | Ht 68.0 in | Wt 172.8 lb

## 2023-07-11 DIAGNOSIS — I10 Essential (primary) hypertension: Secondary | ICD-10-CM | POA: Diagnosis not present

## 2023-07-11 DIAGNOSIS — E041 Nontoxic single thyroid nodule: Secondary | ICD-10-CM

## 2023-07-11 DIAGNOSIS — E782 Mixed hyperlipidemia: Secondary | ICD-10-CM

## 2023-07-11 DIAGNOSIS — E538 Deficiency of other specified B group vitamins: Secondary | ICD-10-CM

## 2023-07-11 DIAGNOSIS — E559 Vitamin D deficiency, unspecified: Secondary | ICD-10-CM

## 2023-07-11 DIAGNOSIS — E611 Iron deficiency: Secondary | ICD-10-CM

## 2023-07-11 DIAGNOSIS — R7303 Prediabetes: Secondary | ICD-10-CM | POA: Diagnosis not present

## 2023-07-11 NOTE — Progress Notes (Signed)
Acute Office Visit  Subjective:     Patient ID: Eric Barron, male    DOB: 17-Apr-1951, 72 y.o.   MRN: [id number]  Patient is in today for  Chief Complaint  Patient presents with   Follow-up    Fatigue & mood swings    Patient here today because he's been having severe fatigue and mood swings.  His wife says that he has been far more tired than usual and he has been having mood swings.   He is doing well physically otherwise.   Needs labs No other concerns today.      Review of Systems  All other systems reviewed and are negative.       Objective:    BP (!) 180/90   Pulse (!) 54   Ht 5\' 8"  (1.727 m)   Wt 172 lb 12.8 oz (78.4 kg)   SpO2 97%   BMI 26.27 kg/m   Physical Exam Vitals and nursing note reviewed.  Constitutional:      Appearance: Normal appearance. He is normal weight.  Eyes:     Pupils: Pupils are equal, round, and reactive to light.  Cardiovascular:     Rate and Rhythm: Normal rate and regular rhythm.     Pulses: Normal pulses.     Heart sounds: Normal heart sounds.  Pulmonary:     Effort: Pulmonary effort is normal.     Breath sounds: Normal breath sounds.  Neurological:     General: No focal deficit present.     Mental Status: He is alert and oriented to person, place, and time. Mental status is at baseline.  Psychiatric:        Mood and Affect: Mood normal.        Behavior: Behavior normal.        Thought Content: Thought content normal.        Judgment: Judgment normal.     Results for orders placed or performed in visit on 07/11/23  Lipid panel  Result Value Ref Range   Cholesterol, Total 165 100 - 199 mg/dL   Triglycerides 621 0 - 149 mg/dL   HDL 51 >30 mg/dL   VLDL Cholesterol Cal 21 5 - 40 mg/dL   LDL Chol Calc (NIH) 93 0 - 99 mg/dL   Chol/HDL Ratio 3.2 0.0 - 5.0 ratio  VITAMIN D 25 Hydroxy (Vit-D Deficiency, Fractures)  Result Value Ref Range   Vit D, 25-Hydroxy 19.9 (L) 30.0 - 100.0 ng/mL  CMP14+EGFR  Result Value  Ref Range   Glucose 93 70 - 99 mg/dL   BUN 17 8 - 27 mg/dL   Creatinine, Ser 8.65 0.76 - 1.27 mg/dL   eGFR 71 >78 IO/NGE/9.52   BUN/Creatinine Ratio 15 10 - 24   Sodium 144 134 - 144 mmol/L   Potassium 4.3 3.5 - 5.2 mmol/L   Chloride 107 (H) 96 - 106 mmol/L   CO2 26 20 - 29 mmol/L   Calcium 9.1 8.6 - 10.2 mg/dL   Total Protein 7.2 6.0 - 8.5 g/dL   Albumin 4.3 3.9 - 4.9 g/dL   Globulin, Total 2.9 1.5 - 4.5 g/dL   Bilirubin Total 0.4 0.0 - 1.2 mg/dL   Alkaline Phosphatase 110 44 - 121 IU/L   AST 13 0 - 40 IU/L   ALT 11 0 - 44 IU/L  TSH  Result Value Ref Range   TSH 1.100 0.450 - 4.500 uIU/mL  Hemoglobin A1c  Result Value Ref Range   Hgb A1c  MFr Bld 6.2 (H) 4.8 - 5.6 %   Est. average glucose Bld gHb Est-mCnc 131 mg/dL  Vitamin W09  Result Value Ref Range   Vitamin B-12 364 232 - 1,245 pg/mL  CBC with Diff  Result Value Ref Range   WBC 6.7 3.4 - 10.8 x10E3/uL   RBC 5.39 4.14 - 5.80 x10E6/uL   Hemoglobin 15.6 13.0 - 17.7 g/dL   Hematocrit 81.1 91.4 - 51.0 %   MCV 89 79 - 97 fL   MCH 28.9 26.6 - 33.0 pg   MCHC 32.4 31.5 - 35.7 g/dL   RDW 78.2 95.6 - 21.3 %   Platelets 164 150 - 450 x10E3/uL   Neutrophils 53 Not Estab. %   Lymphs 32 Not Estab. %   Monocytes 11 Not Estab. %   Eos 3 Not Estab. %   Basos 1 Not Estab. %   Neutrophils Absolute 3.6 1.4 - 7.0 x10E3/uL   Lymphocytes Absolute 2.2 0.7 - 3.1 x10E3/uL   Monocytes Absolute 0.7 0.1 - 0.9 x10E3/uL   EOS (ABSOLUTE) 0.2 0.0 - 0.4 x10E3/uL   Basophils Absolute 0.0 0.0 - 0.2 x10E3/uL   Immature Granulocytes 0 Not Estab. %   Immature Grans (Abs) 0.0 0.0 - 0.1 x10E3/uL  Iron, TIBC and Ferritin Panel  Result Value Ref Range   Total Iron Binding Capacity 282 250 - 450 ug/dL   UIBC 086 578 - 469 ug/dL   Iron 59 38 - 629 ug/dL   Iron Saturation 21 15 - 55 %   Ferritin 60 30 - 400 ng/mL    Recent Results (from the past 2160 hours)  Lipid panel     Status: None   Collection Time: 07/11/23  3:54 PM  Result Value Ref Range    Cholesterol, Total 165 100 - 199 mg/dL   Triglycerides 528 0 - 149 mg/dL   HDL 51 >41 mg/dL   VLDL Cholesterol Cal 21 5 - 40 mg/dL   LDL Chol Calc (NIH) 93 0 - 99 mg/dL   Chol/HDL Ratio 3.2 0.0 - 5.0 ratio    Comment:                                   T. Chol/HDL Ratio                                             Men  Women                               1/2 Avg.Risk  3.4    3.3                                   Avg.Risk  5.0    4.4                                2X Avg.Risk  9.6    7.1                                3X Avg.Risk 23.4   11.0  VITAMIN D 25 Hydroxy (Vit-D Deficiency, Fractures)     Status: Abnormal   Collection Time: 07/11/23  3:54 PM  Result Value Ref Range   Vit D, 25-Hydroxy 19.9 (L) 30.0 - 100.0 ng/mL    Comment: Vitamin D deficiency has been defined by the Institute of Medicine and an Endocrine Society practice guideline as a level of serum 25-OH vitamin D less than 20 ng/mL (1,2). The Endocrine Society went on to further define vitamin D insufficiency as a level between 21 and 29 ng/mL (2). 1. IOM (Institute of Medicine). 2010. Dietary reference    intakes for calcium and D. [place]: The    [employer]. 2. Holick MF, Binkley [place], Bischoff-Ferrari HA, et al.    Evaluation, treatment, and prevention of vitamin D    deficiency: an Endocrine Society clinical practice    guideline. JCEM. 2011 Jul; 96(7):1911-30.   CMP14+EGFR     Status: Abnormal   Collection Time: 07/11/23  3:54 PM  Result Value Ref Range   Glucose 93 70 - 99 mg/dL   BUN 17 8 - 27 mg/dL   Creatinine, Ser 4.09 0.76 - 1.27 mg/dL   eGFR 71 >81 XB/JYN/8.29   BUN/Creatinine Ratio 15 10 - 24   Sodium 144 134 - 144 mmol/L   Potassium 4.3 3.5 - 5.2 mmol/L   Chloride 107 (H) 96 - 106 mmol/L   CO2 26 20 - 29 mmol/L   Calcium 9.1 8.6 - 10.2 mg/dL   Total Protein 7.2 6.0 - 8.5 g/dL   Albumin 4.3 3.9 - 4.9 g/dL   Globulin, Total 2.9 1.5 - 4.5 g/dL   Bilirubin Total 0.4 0.0 - 1.2  mg/dL   Alkaline Phosphatase 110 44 - 121 IU/L   AST 13 0 - 40 IU/L   ALT 11 0 - 44 IU/L  TSH     Status: None   Collection Time: 07/11/23  3:54 PM  Result Value Ref Range   TSH 1.100 0.450 - 4.500 uIU/mL  Hemoglobin A1c     Status: Abnormal   Collection Time: 07/11/23  3:54 PM  Result Value Ref Range   Hgb A1c MFr Bld 6.2 (H) 4.8 - 5.6 %    Comment:          Prediabetes: 5.7 - 6.4          Diabetes: >6.4          Glycemic control for adults with diabetes: <7.0    Est. average glucose Bld gHb Est-mCnc 131 mg/dL  Vitamin F62     Status: None   Collection Time: 07/11/23  3:54 PM  Result Value Ref Range   Vitamin B-12 364 232 - 1,245 pg/mL  CBC with Diff     Status: None   Collection Time: 07/11/23  3:54 PM  Result Value Ref Range   WBC 6.7 3.4 - 10.8 x10E3/uL   RBC 5.39 4.14 - 5.80 x10E6/uL   Hemoglobin 15.6 13.0 - 17.7 g/dL   Hematocrit 13.0 86.5 - 51.0 %   MCV 89 79 - 97 fL   MCH 28.9 26.6 - 33.0 pg   MCHC 32.4 31.5 - 35.7 g/dL   RDW 78.4 69.6 - 29.5 %   Platelets 164 150 - 450 x10E3/uL   Neutrophils 53 Not Estab. %   Lymphs 32 Not Estab. %   Monocytes 11 Not Estab. %   Eos 3 Not Estab. %   Basos 1 Not Estab. %   Neutrophils Absolute 3.6 1.4 -  7.0 x10E3/uL   Lymphocytes Absolute 2.2 0.7 - 3.1 x10E3/uL   Monocytes Absolute 0.7 0.1 - 0.9 x10E3/uL   EOS (ABSOLUTE) 0.2 0.0 - 0.4 x10E3/uL   Basophils Absolute 0.0 0.0 - 0.2 x10E3/uL   Immature Granulocytes 0 Not Estab. %   Immature Grans (Abs) 0.0 0.0 - 0.1 x10E3/uL  Iron, TIBC and Ferritin Panel     Status: None   Collection Time: 07/11/23  3:54 PM  Result Value Ref Range   Total Iron Binding Capacity 282 250 - 450 ug/dL   UIBC 161 096 - 045 ug/dL   Iron 59 38 - 409 ug/dL   Iron Saturation 21 15 - 55 %   Ferritin 60 30 - 400 ng/mL       Assessment & Plan:   Problem List Items Addressed This Visit       Active Problems   Essential hypertension - Primary   Blood pressure well controlled with current  medications.  Continue current therapy.  Will reassess at follow up.       Relevant Orders   CMP14+EGFR (Completed)   CBC with Diff (Completed)   Hyperlipidemia   Checking labs today.  Continue current therapy for lipid control. Will modify as needed based on labwork results.        Relevant Orders   Lipid panel (Completed)   CMP14+EGFR (Completed)   CBC with Diff (Completed)   Prediabetes   Patient educated on foods that contain carbohydrates and the need to decrease intake.  We discussed prediabetes, and what it means and the need for strict dietary control to prevent progression to type 2 diabetes.  Advised to decrease intake of sugary drinks, including sodas, sweet tea, and some juices, and of starch and sugar heavy foods (ie., potatoes, rice, bread, pasta, desserts). He verbalizes understanding and agreement with the changes discussed today.  A1C Continues to be in prediabetic ranges.  Will reassess at follow up after next lab check.  Patient counseled on dietary choices and verbalized understanding.      Relevant Orders   CMP14+EGFR (Completed)   Hemoglobin A1c (Completed)   CBC with Diff (Completed)   Thyroid nodule   Relevant Orders   CMP14+EGFR (Completed)   TSH (Completed)   CBC with Diff (Completed)   Other Visit Diagnoses       Iron deficiency       Checking labs today.  Will continue supplements as needed.   Relevant Orders   CMP14+EGFR (Completed)   CBC with Diff (Completed)   Iron, TIBC and Ferritin Panel (Completed)     Vitamin D deficiency       Checking labs today.  Will continue supplements as needed.   Relevant Orders   VITAMIN D 25 Hydroxy (Vit-D Deficiency, Fractures) (Completed)   CMP14+EGFR (Completed)   CBC with Diff (Completed)     B12 deficiency       Checking labs today.  Will continue supplements as needed.   Relevant Orders   CMP14+EGFR (Completed)   Vitamin B12 (Completed)   CBC with Diff (Completed)       Checking blood  work.  Will call with results.  No follow-ups on file.  Total time spent: 20 minutes  Miki Kins, FNP  07/11/2023   This document may have been prepared by [facility] Voice Recognition software and as such may include unintentional dictation errors.

## 2023-07-12 ENCOUNTER — Encounter: Payer: Self-pay | Admitting: Family

## 2023-07-12 LAB — CMP14+EGFR
ALT: 11 [IU]/L (ref 0–44)
AST: 13 [IU]/L (ref 0–40)
Albumin: 4.3 g/dL (ref 3.9–4.9)
Alkaline Phosphatase: 110 [IU]/L (ref 44–121)
BUN/Creatinine Ratio: 15 (ref 10–24)
BUN: 17 mg/dL (ref 8–27)
Bilirubin Total: 0.4 mg/dL (ref 0.0–1.2)
CO2: 26 mmol/L (ref 20–29)
Calcium: 9.1 mg/dL (ref 8.6–10.2)
Chloride: 107 mmol/L — ABNORMAL HIGH (ref 96–106)
Creatinine, Ser: 1.12 mg/dL (ref 0.76–1.27)
Globulin, Total: 2.9 g/dL (ref 1.5–4.5)
Glucose: 93 mg/dL (ref 70–99)
Potassium: 4.3 mmol/L (ref 3.5–5.2)
Sodium: 144 mmol/L (ref 134–144)
Total Protein: 7.2 g/dL (ref 6.0–8.5)
eGFR: 71 mL/min/{1.73_m2} (ref >59)

## 2023-07-12 LAB — CBC WITH DIFFERENTIAL/PLATELET
Basophils Absolute: 0 10*3/uL (ref 0.0–0.2)
Basos: 1 %
EOS (ABSOLUTE): 0.2 10*3/uL (ref 0.0–0.4)
Eos: 3 %
Hematocrit: 48.1 % (ref 37.5–51.0)
Hemoglobin: 15.6 g/dL (ref 13.0–17.7)
Immature Grans (Abs): 0 10*3/uL (ref 0.0–0.1)
Immature Granulocytes: 0 %
Lymphocytes Absolute: 2.2 10*3/uL (ref 0.7–3.1)
Lymphs: 32 %
MCH: 28.9 pg (ref 26.6–33.0)
MCHC: 32.4 g/dL (ref 31.5–35.7)
MCV: 89 fL (ref 79–97)
Monocytes Absolute: 0.7 10*3/uL (ref 0.1–0.9)
Monocytes: 11 %
Neutrophils Absolute: 3.6 10*3/uL (ref 1.4–7.0)
Neutrophils: 53 %
Platelets: 164 10*3/uL (ref 150–450)
RBC: 5.39 x10E6/uL (ref 4.14–5.80)
RDW: 13.2 % (ref 11.6–15.4)
WBC: 6.7 10*3/uL (ref 3.4–10.8)

## 2023-07-12 LAB — TSH: TSH: 1.1 u[IU]/mL (ref 0.450–4.500)

## 2023-07-12 LAB — LIPID PANEL
Chol/HDL Ratio: 3.2 ratio (ref 0.0–5.0)
Cholesterol, Total: 165 mg/dL (ref 100–199)
HDL: 51 mg/dL (ref >39)
LDL Chol Calc (NIH): 93 mg/dL (ref 0–99)
Triglycerides: 116 mg/dL (ref 0–149)
VLDL Cholesterol Cal: 21 mg/dL (ref 5–40)

## 2023-07-12 LAB — IRON,TIBC AND FERRITIN PANEL
Ferritin: 60 ng/mL (ref 30–400)
Iron Saturation: 21 % (ref 15–55)
Iron: 59 ug/dL (ref 38–169)
Total Iron Binding Capacity: 282 ug/dL (ref 250–450)
UIBC: 223 ug/dL (ref 111–343)

## 2023-07-12 LAB — HEMOGLOBIN A1C
Est. average glucose Bld gHb Est-mCnc: 131 mg/dL
Hgb A1c MFr Bld: 6.2 % — ABNORMAL HIGH (ref 4.8–5.6)

## 2023-07-12 LAB — VITAMIN D 25 HYDROXY (VIT D DEFICIENCY, FRACTURES): Vit D, 25-Hydroxy: 19.9 ng/mL — ABNORMAL LOW (ref 30.0–100.0)

## 2023-07-12 LAB — VITAMIN B12: Vitamin B-12: 364 pg/mL (ref 232–1245)

## 2023-07-15 ENCOUNTER — Telehealth: Payer: Self-pay | Admitting: Family

## 2023-07-15 ENCOUNTER — Encounter: Payer: Self-pay | Admitting: Family

## 2023-07-15 MED ORDER — VITAMIN D (ERGOCALCIFEROL) 1.25 MG (50000 UNIT) PO CAPS: 50000.0000 [IU] | ORAL_CAPSULE | ORAL | 1 refills | Status: DC

## 2023-07-15 NOTE — Progress Notes (Signed)
Patient already notified

## 2023-07-15 NOTE — Telephone Encounter (Signed)
Per Marchelle Folks verbal: Vit D low, would benefit from weekly supplement. Vit B12 low, could do shots if interested. All other labs are stable at this time.  Check BP first thing in morning and again in evening and log until Thursday. Call us back with BP readings on Thursday afternoon.  Patient's wife notified and verbalized understanding.

## 2023-07-15 NOTE — Telephone Encounter (Signed)
Patient's wife left VM wanting patient's lab results from 10/24. She also sent a MyChart message wanting the results. Please advise.

## 2023-08-30 ENCOUNTER — Other Ambulatory Visit: Payer: Self-pay

## 2023-08-30 MED ORDER — LISINOPRIL 40 MG PO TABS: 40.0000 mg | ORAL_TABLET | Freq: Every morning | ORAL | 0 refills | Status: DC

## 2023-09-02 ENCOUNTER — Telehealth: Payer: Self-pay | Admitting: Family

## 2023-09-02 ENCOUNTER — Other Ambulatory Visit: Payer: Self-pay

## 2023-09-02 NOTE — Telephone Encounter (Signed)
Pt wife called stating they need a refill or AMLODOPINE 10MG  sent to [facility] on [address]

## 2023-09-03 ENCOUNTER — Other Ambulatory Visit: Payer: Self-pay

## 2023-09-03 MED ORDER — LISINOPRIL 40 MG PO TABS: 40.0000 mg | ORAL_TABLET | Freq: Every morning | ORAL | 1 refills | Status: AC

## 2023-09-03 MED ORDER — AMLODIPINE BESYLATE 10 MG PO TABS: 10.0000 mg | ORAL_TABLET | Freq: Every day | ORAL | 0 refills | Status: DC

## 2023-09-07 ENCOUNTER — Encounter: Payer: Self-pay | Admitting: Family

## 2023-09-07 NOTE — Assessment & Plan Note (Signed)
Blood pressure well controlled with current medications.  Continue current therapy.  Will reassess at follow up.  

## 2023-09-07 NOTE — Assessment & Plan Note (Signed)
Checking labs today.  Continue current therapy for lipid control. Will modify as needed based on labwork results.  

## 2023-09-07 NOTE — Assessment & Plan Note (Signed)
Patient educated on foods that contain carbohydrates and the need to decrease intake.  We discussed prediabetes, and what it means and the need for strict dietary control to prevent progression to type 2 diabetes.  Advised to decrease intake of sugary drinks, including sodas, sweet tea, and some juices, and of starch and sugar heavy foods (ie., potatoes, rice, bread, pasta, desserts). He verbalizes understanding and agreement with the changes discussed today.   A1C Continues to be in prediabetic ranges.  Will reassess at follow up after next lab check.  Patient counseled on dietary choices and verbalized understanding.

## 2023-09-25 ENCOUNTER — Other Ambulatory Visit: Payer: Self-pay | Admitting: Family

## 2023-10-04 ENCOUNTER — Other Ambulatory Visit: Payer: Self-pay

## 2023-10-04 MED ORDER — CLONIDINE HCL 0.1 MG PO TABS: 0.1000 mg | ORAL_TABLET | Freq: Every morning | ORAL | 1 refills | Status: DC

## 2023-10-10 ENCOUNTER — Telehealth: Payer: Self-pay | Admitting: Family

## 2023-10-10 NOTE — Telephone Encounter (Signed)
Patient's wife left VM stating he was taking vit D for some sickness and now its due for refill and wanted to know if he needs to continue taking it. Please advise.

## 2023-10-11 ENCOUNTER — Other Ambulatory Visit: Payer: Self-pay

## 2023-10-11 MED ORDER — VITAMIN D (ERGOCALCIFEROL) 1.25 MG (50000 UNIT) PO CAPS: 50000.0000 [IU] | ORAL_CAPSULE | ORAL | 1 refills | Status: AC

## 2023-10-15 ENCOUNTER — Ambulatory Visit: Payer: No Typology Code available for payment source | Admitting: Family

## 2023-11-15 ENCOUNTER — Ambulatory Visit: Payer: No Typology Code available for payment source | Admitting: Family

## 2023-11-15 ENCOUNTER — Encounter: Payer: Self-pay | Admitting: Family

## 2023-11-15 VITALS — BP 200/80 | HR 57 | Ht 68.0 in | Wt 166.0 lb

## 2023-11-15 DIAGNOSIS — E782 Mixed hyperlipidemia: Secondary | ICD-10-CM

## 2023-11-15 DIAGNOSIS — R634 Abnormal weight loss: Secondary | ICD-10-CM

## 2023-11-15 DIAGNOSIS — E611 Iron deficiency: Secondary | ICD-10-CM

## 2023-11-15 DIAGNOSIS — E041 Nontoxic single thyroid nodule: Secondary | ICD-10-CM

## 2023-11-15 DIAGNOSIS — R7303 Prediabetes: Secondary | ICD-10-CM | POA: Diagnosis not present

## 2023-11-15 DIAGNOSIS — R5383 Other fatigue: Secondary | ICD-10-CM

## 2023-11-15 DIAGNOSIS — Z125 Encounter for screening for malignant neoplasm of prostate: Secondary | ICD-10-CM

## 2023-11-15 DIAGNOSIS — E538 Deficiency of other specified B group vitamins: Secondary | ICD-10-CM

## 2023-11-15 DIAGNOSIS — I10 Essential (primary) hypertension: Secondary | ICD-10-CM

## 2023-11-15 DIAGNOSIS — E559 Vitamin D deficiency, unspecified: Secondary | ICD-10-CM

## 2023-11-15 DIAGNOSIS — I69354 Hemiplegia and hemiparesis following cerebral infarction affecting left non-dominant side: Secondary | ICD-10-CM

## 2023-11-15 DIAGNOSIS — Z1159 Encounter for screening for other viral diseases: Secondary | ICD-10-CM

## 2023-11-15 NOTE — Progress Notes (Signed)
 g  Established Patient Office Visit  Subjective:  Patient ID: Eric Barron, male    DOB: [birth year]  Age: 73 y.o. MRN: [id number]  Chief Complaint  Patient presents with   Follow-up    Discuss weight loss concerns    Patient is here today with his wife, says that he has been losing significant weight.  Given his history and severe fatigue, she is concerned for cancer.   His blood pressure is extremely elevated in the office today when he first got here, but he says he has not taken his meds today.    No other concerns at this time.   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)    CVA, old, dysarthria    HTN (hypertension)     Past Surgical History:  Procedure Laterality Date   KNEE SURGERY Right     Social History   Socioeconomic History   Marital status: Married    Spouse name: Debbie   Number of children: Not on file   Years of education: Not on file   Highest education level: Not on file  Occupational History   Not on file  Tobacco Use   Smoking status: Former    Current packs/day: 0.00    Average packs/day: 1 pack/day for 6.7 years (6.7 ttl pk-yrs)    Types: Cigarettes    Start date: 12/18/1993    Quit date: 08/19/2000    Years since quitting: 23.2   Smokeless tobacco: Never   Tobacco comments:    pt unsure about dates  Vaping Use   Vaping status: Unknown  Substance and Sexual Activity   Alcohol use: Never   Drug use: Never   Sexual activity: Not on file  Other Topics Concern   Not on file  Social History Narrative   Not on file   Social Drivers of Health   Financial Resource Strain: Not on file  Food Insecurity: Not on file  Transportation Needs: Not on file  Physical Activity: Not on file  Stress: Not on file  Social Connections: Not on file  Intimate Partner Violence: Not on file    No family history on file.  Allergies  Allergen Reactions   Lipitor   [Atorvastatin ]     Review of Systems  Constitutional:  Positive for malaise/fatigue and weight loss.  All other systems reviewed and are negative.      Objective:   BP (!) 200/80   Pulse (!) 57   Ht 5\' 8"  (1.727 m)   Wt 166 lb (75.3 kg)   SpO2 96%   BMI 25.24 kg/m   Vitals:   11/15/23 0858  BP: (!) 200/80  Pulse: (!) 57  Height: 5\' 8"  (1.727 m)  Weight: 166 lb (75.3 kg)  SpO2: 96%  BMI (Calculated): 25.25    Physical Exam Vitals and nursing note reviewed.  Constitutional:      Appearance: Normal appearance. He is normal weight.  Eyes:     Pupils: Pupils are equal, round, and reactive to light.  Cardiovascular:     Rate and Rhythm: Normal rate and regular rhythm.     Pulses: Normal pulses.     Heart sounds: Normal heart sounds.  Pulmonary:     Effort: Pulmonary effort is normal.     Breath sounds: Normal breath sounds.  Neurological:     General: No focal deficit present.     Mental Status: He is  alert. Mental status is at baseline.     Motor: Weakness present.  Psychiatric:        Mood and Affect: Mood normal.        Behavior: Behavior normal.        Thought Content: Thought content normal.        Judgment: Judgment normal.      No results found for any visits on 11/15/23.  No results found for this or any previous visit (from the past 2160 hours).     Assessment & Plan:   Problem List Items Addressed This Visit       Cardiovascular and Mediastinum   Essential hypertension - Primary   Relevant Orders   CMP14+EGFR   CBC with Diff     Endocrine   Thyroid  nodule   Relevant Orders   CMP14+EGFR   CBC with Diff   US  THYROID      Other   Hyperlipidemia   Relevant Orders   Lipid panel   CMP14+EGFR   CBC with Diff   Prediabetes   Relevant Orders   CMP14+EGFR   Hemoglobin A1c   CBC with Diff   Other Visit Diagnoses       Iron deficiency       Relevant Orders   CMP14+EGFR   CBC with Diff   Iron, TIBC and Ferritin Panel     B12  deficiency       Relevant Orders   CMP14+EGFR   Vitamin B12   CBC with Diff     Vitamin D  deficiency, unspecified       Relevant Orders   VITAMIN D  25 Hydroxy (Vit-D Deficiency, Fractures)   CMP14+EGFR   CBC with Diff     Need for hepatitis C screening test       Relevant Orders   CMP14+EGFR   CBC with Diff   Hepatitis C Ab reflex to Quant PCR     Abnormal weight loss       Relevant Orders   CMP14+EGFR   CBC with Diff   CA 125   CEA     Other fatigue       Relevant Orders   CMP14+EGFR   TSH   CBC with Diff     Prostate cancer screening       Relevant Orders   CMP14+EGFR   CBC with Diff   PSA      Checking labs today - will call with results.  Also checking Hepatitis C, and checking PSA, CEA, CA125 to screen for cancers. Will call with results when available.   Also ordering thyroid  ultrasound to re-check the thyroid  nodule he had last year.   No follow-ups on file.   Total time spent: 20 minutes  Trenda Frisk, FNP  11/15/2023   This document may have been prepared by [facility] Voice Recognition software and as such may include unintentional dictation errors.

## 2023-11-16 LAB — CA 125: Cancer Antigen (CA) 125: 9.1 U/mL

## 2023-11-16 LAB — CMP14+EGFR
ALT: 19 IU/L (ref 0–44)
AST: 15 IU/L (ref 0–40)
Albumin: 3.9 g/dL (ref 3.9–4.9)
Alkaline Phosphatase: 103 IU/L (ref 44–121)
BUN/Creatinine Ratio: 12 (ref 10–24)
BUN: 12 mg/dL (ref 8–27)
Bilirubin Total: 0.3 mg/dL (ref 0.0–1.2)
CO2: 24 mmol/L (ref 20–29)
Calcium: 9.3 mg/dL (ref 8.6–10.2)
Chloride: 107 mmol/L — ABNORMAL HIGH (ref 96–106)
Creatinine, Ser: 0.98 mg/dL (ref 0.76–1.27)
Globulin, Total: 3.1 g/dL (ref 1.5–4.5)
Glucose: 97 mg/dL (ref 70–99)
Potassium: 4.6 mmol/L (ref 3.5–5.2)
Sodium: 144 mmol/L (ref 134–144)
Total Protein: 7 g/dL (ref 6.0–8.5)
eGFR: 83 mL/min/{1.73_m2} (ref >59)

## 2023-11-16 LAB — CBC WITH DIFFERENTIAL/PLATELET
Basophils Absolute: 0.1 10*3/uL (ref 0.0–0.2)
Basos: 1 %
EOS (ABSOLUTE): 0.2 10*3/uL (ref 0.0–0.4)
Eos: 2 %
Hematocrit: 44.3 % (ref 37.5–51.0)
Hemoglobin: 14.2 g/dL (ref 13.0–17.7)
Immature Grans (Abs): 0 10*3/uL (ref 0.0–0.1)
Immature Granulocytes: 0 %
Lymphocytes Absolute: 2.2 10*3/uL (ref 0.7–3.1)
Lymphs: 23 %
MCH: 29 pg (ref 26.6–33.0)
MCHC: 32.1 g/dL (ref 31.5–35.7)
MCV: 90 fL (ref 79–97)
Monocytes Absolute: 0.6 10*3/uL (ref 0.1–0.9)
Monocytes: 7 %
Neutrophils Absolute: 6.4 10*3/uL (ref 1.4–7.0)
Neutrophils: 67 %
Platelets: 254 10*3/uL (ref 150–450)
RBC: 4.9 x10E6/uL (ref 4.14–5.80)
RDW: 12.9 % (ref 11.6–15.4)
WBC: 9.5 10*3/uL (ref 3.4–10.8)

## 2023-11-16 LAB — LIPID PANEL
Chol/HDL Ratio: 3.6 ratio (ref 0.0–5.0)
Cholesterol, Total: 150 mg/dL (ref 100–199)
HDL: 42 mg/dL (ref >39)
LDL Chol Calc (NIH): 90 mg/dL (ref 0–99)
Triglycerides: 98 mg/dL (ref 0–149)
VLDL Cholesterol Cal: 18 mg/dL (ref 5–40)

## 2023-11-16 LAB — IRON,TIBC AND FERRITIN PANEL
Ferritin: 125 ng/mL (ref 30–400)
Iron Saturation: 24 % (ref 15–55)
Iron: 60 ug/dL (ref 38–169)
Total Iron Binding Capacity: 250 ug/dL (ref 250–450)
UIBC: 190 ug/dL (ref 111–343)

## 2023-11-16 LAB — VITAMIN B12: Vitamin B-12: 463 pg/mL (ref 232–1245)

## 2023-11-16 LAB — HEMOGLOBIN A1C
Est. average glucose Bld gHb Est-mCnc: 134 mg/dL
Hgb A1c MFr Bld: 6.3 % — ABNORMAL HIGH (ref 4.8–5.6)

## 2023-11-16 LAB — TSH: TSH: 1.38 u[IU]/mL (ref 0.450–4.500)

## 2023-11-16 LAB — HCV AB W REFLEX TO QUANT PCR: HCV Ab: NONREACTIVE

## 2023-11-16 LAB — HCV INTERPRETATION

## 2023-11-16 LAB — VITAMIN D 25 HYDROXY (VIT D DEFICIENCY, FRACTURES): Vit D, 25-Hydroxy: 63.5 ng/mL (ref 30.0–100.0)

## 2023-11-16 LAB — PSA: Prostate Specific Ag, Serum: 2.6 ng/mL (ref 0.0–4.0)

## 2023-11-16 LAB — CEA: CEA: 0.9 ng/mL (ref 0.0–4.7)

## 2023-11-20 ENCOUNTER — Ambulatory Visit (INDEPENDENT_AMBULATORY_CARE_PROVIDER_SITE_OTHER)

## 2023-11-20 DIAGNOSIS — E041 Nontoxic single thyroid nodule: Secondary | ICD-10-CM | POA: Diagnosis not present

## 2023-11-21 ENCOUNTER — Telehealth: Payer: Self-pay

## 2023-11-21 NOTE — Telephone Encounter (Signed)
 Patient  wife called asking for lab results

## 2023-11-25 ENCOUNTER — Telehealth: Payer: Self-pay | Admitting: Family

## 2023-11-25 NOTE — Telephone Encounter (Signed)
 Patient's wife left VM wanting his lab results. Please advise.

## 2023-11-26 NOTE — Progress Notes (Signed)
 Patient notified

## 2024-02-01 ENCOUNTER — Encounter: Payer: Self-pay | Admitting: Family

## 2024-02-01 DIAGNOSIS — I69354 Hemiplegia and hemiparesis following cerebral infarction affecting left non-dominant side: Secondary | ICD-10-CM | POA: Insufficient documentation

## 2024-04-11 ENCOUNTER — Other Ambulatory Visit: Payer: Self-pay | Admitting: Family

## 2024-07-05 ENCOUNTER — Other Ambulatory Visit: Payer: Self-pay | Admitting: Family

## 2024-07-30 ENCOUNTER — Emergency Department

## 2024-07-30 ENCOUNTER — Emergency Department
Admission: EM | Admit: 2024-07-30 | Discharge: 2024-07-30 | Disposition: A | Discharge status: Home / Self Care | Attending: Emergency Medicine | Admitting: Emergency Medicine

## 2024-07-30 ENCOUNTER — Other Ambulatory Visit: Payer: Self-pay

## 2024-07-30 DIAGNOSIS — Z8673 Personal history of transient ischemic attack (TIA), and cerebral infarction without residual deficits: Secondary | ICD-10-CM | POA: Diagnosis not present

## 2024-07-30 DIAGNOSIS — I10 Essential (primary) hypertension: Secondary | ICD-10-CM | POA: Insufficient documentation

## 2024-07-30 LAB — CBC
HCT: 46 % (ref 39.0–52.0)
Hemoglobin: 14.5 g/dL (ref 13.0–17.0)
MCH: 29 pg (ref 26.0–34.0)
MCHC: 31.5 g/dL (ref 30.0–36.0)
MCV: 92 fL (ref 80.0–100.0)
Platelets: 155 K/uL (ref 150–400)
RBC: 5 MIL/uL (ref 4.22–5.81)
RDW: 13.6 % (ref 11.5–15.5)
WBC: 7.2 K/uL (ref 4.0–10.5)
nRBC: 0 % (ref 0.0–0.2)

## 2024-07-30 LAB — TROPONIN T, HIGH SENSITIVITY: Troponin T High Sensitivity: <15 ng/L (ref 0–19)

## 2024-07-30 LAB — BASIC METABOLIC PANEL WITH GFR
Anion gap: 9 (ref 5–15)
BUN: 16 mg/dL (ref 8–23)
CO2: 28 mmol/L (ref 22–32)
Calcium: 9 mg/dL (ref 8.9–10.3)
Chloride: 107 mmol/L (ref 98–111)
Creatinine, Ser: 1.03 mg/dL (ref 0.61–1.24)
GFR, Estimated: >60 mL/min (ref >60)
Glucose, Bld: 101 mg/dL — ABNORMAL HIGH (ref 70–99)
Potassium: 3.9 mmol/L (ref 3.5–5.1)
Sodium: 144 mmol/L (ref 135–145)

## 2024-07-30 MED ORDER — AMLODIPINE BESYLATE 5 MG PO TABS
10.0000 mg | ORAL_TABLET | Freq: Once | ORAL | Status: AC
  Administered 2024-07-30: 10 mg via ORAL
  Filled 2024-07-30: qty 2

## 2024-07-30 MED ORDER — HYDROXYZINE HCL 25 MG PO TABS
25.0000 mg | ORAL_TABLET | Freq: Once | ORAL | Status: AC
  Administered 2024-07-30: 25 mg via ORAL
  Filled 2024-07-30: qty 1

## 2024-07-30 NOTE — Discharge Instructions (Addendum)
 As we discussed please take your blood pressure medications in the morning, follow-up with your primary care doctor tomorrow regarding your high blood pressure.  Return to the emergency department for any symptom personally concerning to yourself.

## 2024-07-30 NOTE — ED Provider Notes (Signed)
 [facility] Provider Note    Event Date/Time   First MD Initiated Contact with Patient 07/30/24 2050     (approximate)  History   Chief Complaint: Hypertension  HPI  Eric Barron is a 73 y.o. male with a past medical history of hypertension, prior CVA, presents emergency department for elevated blood pressure.  According to the wife they are on the phone with the insurance company doing a health checkup the insurance company had them check his blood pressure at home and it came back significantly elevated around 260 per wife they told him to go to the emergency department.  Patient denies any symptoms denies any headache chest pain shortness of breath denies any weakness or numbness.  Wife states the patient's had a prior CVA and has short-term memory loss and often times forgets to take his blood pressure meds.  She believes that the patient likely has been forgetting to take his medications.  Physical Exam   Triage Vital Signs: ED Triage Vitals [07/30/24 1918]  Encounter Vitals Group     BP (!) 205/101     Girls Systolic BP Percentile      Girls Diastolic BP Percentile      Boys Systolic BP Percentile      Boys Diastolic BP Percentile      Pulse Rate 60     Resp 18     Temp 98.1 F (36.7 C)     Temp Source Oral     SpO2 96 %     Weight 156 lb (70.8 kg)     Height 5' 9 (1.753 m)     Head Circumference      Peak Flow      Pain Score 0     Pain Loc      Pain Education      Exclude from Growth Chart     Most recent vital signs: Vitals:   07/30/24 2100 07/30/24 2143  BP: (!) 198/83 (!) 193/76  Pulse: (!) 52   Resp: 18   Temp:    SpO2: 100%     General: Awake, no distress.  CV:  Good peripheral perfusion.  Regular rate and rhythm  Resp:  Normal effort.  Equal breath sounds bilaterally.  Abd:  No distention.  Soft, nontender.  No rebound or guarding.  ED Results / Procedures / Treatments   EKG  EKG viewed and interpreted by myself  shows sinus bradycardia 58 bpm with a widened QRS, normal axis, normal intervals, no concerning ST changes.  No ST elevation.  RADIOLOGY  I have reviewed interpret the chest x-ray images.  No consolidation my evaluation. Radiology is read the x-ray is negative for acute process.   MEDICATIONS ORDERED IN ED: Medications  amLODipine  (NORVASC ) tablet 10 mg (10 mg Oral Given 07/30/24 2143)  hydrOXYzine (ATARAX) tablet 25 mg (25 mg Oral Given 07/30/24 2143)     IMPRESSION / MDM / ASSESSMENT AND PLAN / ED COURSE  I reviewed the triage vital signs and the nursing notes.  Patient's presentation is most consistent with acute presentation with potential threat to life or bodily function.  Patient presents emergency department for hypertension.  Patient did not have any symptoms at home.  Wife states blood pressure normally runs around 140 but tonight was significantly over 200.  She believes the patient likely is not been taking his medication.  Patient given small dose of hydroxyzine and amlodipine  in the emergency department.  Patient's blood pressure is  now down to 193/76.  The remainder of the patient's workup is reassuring so far with a reassuring CBC with a normal white blood cell count reassuring chemistry and a negative troponin.  Chest x-ray is clear and EKG reassuring.  Patient remains symptom-free in the emergency department.  Wife is comfortable going home I discussed with the wife and patient taking his normal blood pressure medication in the morning and following up with his doctor tomorrow.  Provided my typical hypertension return precautions.  They are agreeable to this plan of care.  FINAL CLINICAL IMPRESSION(S) / ED DIAGNOSES   Hypertension   Note:  This document was prepared using Dragon voice recognition software and may include unintentional dictation errors.   Dorothyann Drivers, MD 07/30/24 2244

## 2024-07-30 NOTE — ED Triage Notes (Addendum)
 Patient ambulatory to triage with complaints of hypertension. Wife reports patient has hx of 2 strokes previously. Deficits are short term memory loss and drags the left leg. States at home it was reading high when on a phone call with insurance who wanted it checked. Patient denies symptoms, no headache, chest pain, dizziness, blurred vision.

## 2024-08-28 ENCOUNTER — Other Ambulatory Visit: Payer: Self-pay | Admitting: Family

## 2024-09-24 ENCOUNTER — Other Ambulatory Visit: Payer: Self-pay | Admitting: Family
